# Patient Record
Sex: Female | Born: 1970 | Race: Black or African American | Hispanic: No | Marital: Single | Smoking: Former smoker
Health system: Southern US, Community
[De-identification: ages and names within clinical notes are randomized; demographics above are authoritative.]

## PROBLEM LIST (undated history)

## (undated) DIAGNOSIS — D219 Benign neoplasm of connective and other soft tissue, unspecified: Secondary | ICD-10-CM

## (undated) DIAGNOSIS — B009 Herpesviral infection, unspecified: Secondary | ICD-10-CM

## (undated) DIAGNOSIS — H9202 Otalgia, left ear: Secondary | ICD-10-CM

## (undated) DIAGNOSIS — H00016 Hordeolum externum left eye, unspecified eyelid: Secondary | ICD-10-CM

## (undated) DIAGNOSIS — Z8679 Personal history of other diseases of the circulatory system: Secondary | ICD-10-CM

## (undated) DIAGNOSIS — E66813 Obesity, class 3: Secondary | ICD-10-CM

## (undated) DIAGNOSIS — J40 Bronchitis, not specified as acute or chronic: Secondary | ICD-10-CM

## (undated) DIAGNOSIS — T7840XA Allergy, unspecified, initial encounter: Secondary | ICD-10-CM

## (undated) DIAGNOSIS — E559 Vitamin D deficiency, unspecified: Secondary | ICD-10-CM

## (undated) DIAGNOSIS — I509 Heart failure, unspecified: Secondary | ICD-10-CM

## (undated) DIAGNOSIS — I429 Cardiomyopathy, unspecified: Secondary | ICD-10-CM

## (undated) DIAGNOSIS — I10 Essential (primary) hypertension: Secondary | ICD-10-CM

## (undated) DIAGNOSIS — J302 Other seasonal allergic rhinitis: Secondary | ICD-10-CM

## (undated) DIAGNOSIS — N809 Endometriosis, unspecified: Secondary | ICD-10-CM

## (undated) HISTORY — DX: Herpesviral infection, unspecified: B00.9

## (undated) HISTORY — DX: Endometriosis, unspecified: N80.9

## (undated) HISTORY — PX: BREAST LUMPECTOMY: SHX2

## (undated) HISTORY — DX: Vitamin D deficiency, unspecified: E55.9

## (undated) HISTORY — DX: Morbid (severe) obesity due to excess calories: E66.01

## (undated) HISTORY — DX: Benign neoplasm of connective and other soft tissue, unspecified: D21.9

## (undated) HISTORY — DX: Allergy, unspecified, initial encounter: T78.40XA

## (undated) HISTORY — DX: Hordeolum externum left eye, unspecified eyelid: H00.016

## (undated) HISTORY — DX: Otalgia, left ear: H92.02

## (undated) HISTORY — DX: Essential (primary) hypertension: I10

## (undated) HISTORY — DX: Other seasonal allergic rhinitis: J30.2

## (undated) HISTORY — DX: Personal history of other diseases of the circulatory system: Z86.79

## (undated) HISTORY — DX: Obesity, class 3: E66.813

---

## 1992-10-14 HISTORY — PX: BREAST BIOPSY: SHX20

## 2001-05-15 ENCOUNTER — Other Ambulatory Visit: Admission: RE | Admit: 2001-05-15 | Discharge: 2001-05-15 | Payer: Self-pay | Admitting: Obstetrics and Gynecology

## 2008-10-26 ENCOUNTER — Ambulatory Visit: Payer: Self-pay | Admitting: Obstetrics & Gynecology

## 2008-10-27 ENCOUNTER — Inpatient Hospital Stay: Payer: Self-pay | Admitting: Obstetrics & Gynecology

## 2008-11-02 ENCOUNTER — Inpatient Hospital Stay: Payer: Self-pay | Admitting: Internal Medicine

## 2008-12-12 HISTORY — PX: COLPOSCOPY: SHX161

## 2008-12-12 HISTORY — PX: COLONOSCOPY: SHX174

## 2009-01-10 DIAGNOSIS — I10 Essential (primary) hypertension: Secondary | ICD-10-CM | POA: Insufficient documentation

## 2013-12-12 HISTORY — PX: INTRAUTERINE DEVICE INSERTION: SHX323

## 2014-07-19 ENCOUNTER — Ambulatory Visit: Payer: Self-pay | Admitting: Family Medicine

## 2015-05-07 ENCOUNTER — Other Ambulatory Visit: Payer: Self-pay | Admitting: Family Medicine

## 2015-06-13 ENCOUNTER — Encounter: Payer: Self-pay | Admitting: Family Medicine

## 2015-06-13 ENCOUNTER — Ambulatory Visit (INDEPENDENT_AMBULATORY_CARE_PROVIDER_SITE_OTHER): Payer: BC Managed Care – PPO | Admitting: Family Medicine

## 2015-06-13 VITALS — BP 118/76 | HR 87 | Temp 98.0°F | Resp 16 | Wt 260.2 lb

## 2015-06-13 DIAGNOSIS — I10 Essential (primary) hypertension: Secondary | ICD-10-CM

## 2015-06-13 DIAGNOSIS — H00039 Abscess of eyelid unspecified eye, unspecified eyelid: Secondary | ICD-10-CM | POA: Insufficient documentation

## 2015-06-13 DIAGNOSIS — Z23 Encounter for immunization: Secondary | ICD-10-CM | POA: Diagnosis not present

## 2015-06-13 DIAGNOSIS — I5020 Unspecified systolic (congestive) heart failure: Secondary | ICD-10-CM

## 2015-06-13 DIAGNOSIS — Z13220 Encounter for screening for lipoid disorders: Secondary | ICD-10-CM | POA: Insufficient documentation

## 2015-06-13 DIAGNOSIS — Z1239 Encounter for other screening for malignant neoplasm of breast: Secondary | ICD-10-CM | POA: Insufficient documentation

## 2015-06-13 DIAGNOSIS — O903 Peripartum cardiomyopathy: Secondary | ICD-10-CM

## 2015-06-13 DIAGNOSIS — O2690 Pregnancy related conditions, unspecified, unspecified trimester: Secondary | ICD-10-CM | POA: Insufficient documentation

## 2015-06-13 DIAGNOSIS — R87610 Atypical squamous cells of undetermined significance on cytologic smear of cervix (ASC-US): Secondary | ICD-10-CM | POA: Insufficient documentation

## 2015-06-13 DIAGNOSIS — Z713 Dietary counseling and surveillance: Secondary | ICD-10-CM | POA: Insufficient documentation

## 2015-06-13 DIAGNOSIS — Z131 Encounter for screening for diabetes mellitus: Secondary | ICD-10-CM | POA: Insufficient documentation

## 2015-06-13 DIAGNOSIS — J302 Other seasonal allergic rhinitis: Secondary | ICD-10-CM | POA: Insufficient documentation

## 2015-06-13 DIAGNOSIS — H9209 Otalgia, unspecified ear: Secondary | ICD-10-CM | POA: Insufficient documentation

## 2015-06-13 DIAGNOSIS — J309 Allergic rhinitis, unspecified: Secondary | ICD-10-CM | POA: Insufficient documentation

## 2015-06-13 DIAGNOSIS — R0602 Shortness of breath: Secondary | ICD-10-CM | POA: Insufficient documentation

## 2015-06-13 DIAGNOSIS — Z87898 Personal history of other specified conditions: Secondary | ICD-10-CM | POA: Insufficient documentation

## 2015-06-13 DIAGNOSIS — I509 Heart failure, unspecified: Secondary | ICD-10-CM | POA: Insufficient documentation

## 2015-06-13 DIAGNOSIS — E668 Other obesity: Secondary | ICD-10-CM | POA: Insufficient documentation

## 2015-06-13 HISTORY — DX: Peripartum cardiomyopathy: O90.3

## 2015-06-13 HISTORY — DX: Personal history of other specified conditions: Z87.898

## 2015-06-13 HISTORY — DX: Dietary counseling and surveillance: Z71.3

## 2015-06-13 MED ORDER — LORCASERIN HCL 10 MG PO TABS: 1.0000 | ORAL_TABLET | Freq: Two times a day (BID) | ORAL | Status: DC

## 2015-06-13 NOTE — Progress Notes (Signed)
Name: Heather Case   MRN: [id number]    DOB: 10-29-1970   Date:06/13/2015       Progress Note  Subjective  Chief Complaint  Chief Complaint  Patient presents with  . Hypertension    patient is here for her 59-month follow-up  . Obesity    patient needs a refill of her Belviq    HPI  Patient is here for routine follow up of Hypertension. First diagnosed with hypertension several years ago. Current anti-hypertension medication regimen includes dietary modification, weight management and Lisinopril 40 mg daily, Carvedilol 6.25 mg twice a day.  She was first diagnosed with CHF after her pregnancy some years ago. Since then followed by Dr. Clayborn Bigness, cardiology, yearly with no exacerbations or acute failures. Patient is following physician recommended management. Checking blood pressure outside of physician office. Results average systolic [id number] and average diastolic 21-30. Associated symptoms do not include headache, dizziness, nausea, lower extremity swelling, shortness of breath, chest pain, numbness.  Patient is here for routine follow up of Obesity. The patient is appropriately concerned about their weight. Onset of weight issues started several years ago. Highest recorded weight 260 lbs. Associated symptoms or diseases include pain in weight bearing joints, HTN, CHF. Current efforts to reduce weight include low fat, low carbohydrate, low sugar diet along with daily exercise.  Medications used to aid in weight loss efforts include Belviq 10 mg twice a day.  Describe current exercise regimen: infrequently.  Describe current diet regimen: poor diet habits   Active Ambulatory Problems    Diagnosis Date Noted  . Atypical squamous cell changes of cervix undetermined significance favor benign 06/13/2015  . Allergic rhinitis, seasonal 06/13/2015  . History of prolonged Q-T interval on ECG 06/13/2015  . Hypertension goal BP (blood pressure) < 140/90 01/10/2009  . Dietary counseling and  surveillance 06/13/2015  . CHF NYHA class I (no symptoms from ordinary activities) 06/13/2015  . Obesity, Class II, BMI 35-39.9, with comorbidity 06/13/2015  . 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 Need for immunization against influenza 06/13/2015   Resolved Ambulatory Problems    Diagnosis Date Noted  . Encounter for screening for diabetes mellitus 06/13/2015  . Encounter for immunization 06/13/2015  . Boil of eyelid 06/13/2015  . Unilateral earache 06/13/2015  . Extreme obesity 06/13/2015  . Parity 2 06/13/2015  . Pregnancy, complications of 86/57/8469  . Encounter for screening for lipoid disorders 06/13/2015  . Screening breast examination 06/13/2015  . Term birth of infant 06/13/2015  . Breath shortness 06/13/2015   Past Medical History  Diagnosis Date  . Obesity, Class III, BMI 40-49.9 (morbid obesity)   . HTN, goal below 140/90   . History of CHF (congestive heart failure)   . Hordeolum externum of left eye   . Left ear pain     Past Surgical History  Procedure Laterality Date  . Colonoscopy  12/2008    Dr. Kenton Kingfisher, follow-up ASCUS  . Cesarean section      x's 2  . Breast lumpectomy      benign (at age 43yr)    Family History  Problem Relation Age of Onset  . Cancer Mother     breast  . Hypertension Father   . Diabetes Father   . Cancer Maternal Grandmother     breast    Social History   Social History  . Marital Status: Unknown    Spouse Name: N/A  . Number of Children: N/A  . Years of Education: N/A  Occupational History  . Not on file.   Social History Main Topics  . Smoking status: Former Research scientist (life sciences)  . Smokeless tobacco: Not on file     Comment: quit in 2010. Used to smoke 1/2 PPD.  Marland Kitchen Alcohol Use: 0.0 oz/week    0 Standard drinks or equivalent per week     Comment: occasional  . Drug Use: No  . Sexual Activity:    Partners: Male    Birth Control/ Protection: Condom   Other Topics Concern  . Not on file   Social History  Narrative     Current outpatient prescriptions:  .  aspirin EC 81 MG tablet, Take by mouth., Disp: , Rfl:  .  carvedilol (COREG) 6.25 MG tablet, TAKE 1 TABLET BY MOUTH TWICE A DAY, Disp: 60 tablet, Rfl: 1 .  fluticasone (FLONASE) 50 MCG/ACT nasal spray, Place into the nose., Disp: , Rfl:  .  lisinopril (PRINIVIL,ZESTRIL) 40 MG tablet, Take by mouth., Disp: , Rfl:  .  loratadine (CLARITIN) 10 MG tablet, Take by mouth., Disp: , Rfl:  .  Lorcaserin HCl (BELVIQ) 10 MG TABS, Take 1 tablet by mouth 2 (two) times daily., Disp: 60 tablet, Rfl: 2  Allergies  Allergen Reactions  . Penicillin V Rash     ROS  CONSTITUTIONAL: No significant weight changes, fever, chills, weakness or fatigue.  HEENT:  - Eyes: No visual changes.  - Ears: No auditory changes. No pain.  - Nose: No sneezing, congestion, runny nose. - Throat: No sore throat. No changes in swallowing. SKIN: No rash or itching.  CARDIOVASCULAR: No chest pain, chest pressure or chest discomfort. No palpitations or edema.  RESPIRATORY: No shortness of breath, cough or sputum.  GASTROINTESTINAL: No anorexia, nausea, vomiting. No changes in bowel habits. No abdominal pain or blood.  GENITOURINARY: No dysuria. No frequency. No discharge.  NEUROLOGICAL: No headache, dizziness, syncope, paralysis, ataxia, numbness or tingling in the extremities. No memory changes. No change in bowel or bladder control.  MUSCULOSKELETAL: No joint pain. No muscle pain. HEMATOLOGIC: No anemia, bleeding or bruising.  LYMPHATICS: No enlarged lymph nodes.  PSYCHIATRIC: No change in mood. No change in sleep pattern.  ENDOCRINOLOGIC: No reports of sweating, cold or heat intolerance. No polyuria or polydipsia.   Objective  Filed Vitals:   06/13/15 0814  BP: 118/76  Pulse: 87  Temp: 98 F (36.7 C)  TempSrc: Oral  Resp: 16  Weight: 260 lb 3.2 oz (118.026 kg)  SpO2: 97%   Body mass index is 40.74 kg/(m^2).  Physical Exam  Constitutional: Patient is  obese and well-nourished. In no distress.  HEENT:  - Head: Normocephalic and atraumatic.  - Ears: Bilateral TMs gray, no erythema or effusion - Nose: Nasal mucosa moist - Mouth/Throat: Oropharynx is clear and moist. No tonsillar hypertrophy or erythema. No post nasal drainage.  - Eyes: Conjunctivae clear, EOM movements normal. PERRLA. No scleral icterus.  Neck: Normal range of motion. Neck supple. No JVD present. No thyromegaly present.  Cardiovascular: Normal rate, regular rhythm and normal heart sounds.  No murmur heard.  Pulmonary/Chest: Effort normal and breath sounds normal. No respiratory distress. Musculoskeletal: Normal range of motion bilateral UE and LE, no joint effusions. Peripheral vascular: Bilateral LE no edema. Neurological: CN II-XII grossly intact with no focal deficits. Alert and oriented to person, place, and time. Coordination, balance, strength, speech and gait are normal.  Skin: Skin is warm and dry. No rash noted. No erythema.  Psychiatric: Patient has a normal mood and  affect. Behavior is normal in office today. Judgment and thought content normal in office today.   Assessment & Plan  1. Hypertension goal BP (blood pressure) < 140/90 Clinically stable findings based on clinical exam and on review of any pertinent results. Recommended to patient that they continue their current regimen with regular follow ups.   2. CHF NYHA class I (no symptoms from ordinary activities), unspecified failure chronicity, systolic Clinically stable findings based on clinical exam and on review of any pertinent results. Recommended to patient that they continue their current regimen with regular follow ups.   3. Obesity, Class III, BMI 40-49.9 (morbid obesity) Has not lost much weight since starting Belviq earlier this year.  If not lost 5% of current weight in 3 months will discontinue Belviq.  The patient has been counseled on their higher than normal BMI.  They have verbally  expressed understanding their increased risk for other diseases.  In efforts to meet a better target BMI goal the patient has been counseled on lifestyle, diet and exercise modification tactics. Start with moderate intensity aerobic exercise (walking, jogging, elliptical, swimming, group or individual sports, hiking) at least 51mins a day at least 4 days a week and increase intensity, duration, frequency as tolerated. Diet should include well balance fresh fruits and vegetables avoiding processed foods, carbohydrates and sugars. Drink at least 8oz 10 glasses a day avoiding sodas, sugary fruit drinks, sweetened tea. Check weight on a reliable scale daily and monitor weight loss progress daily. Consider investing in mobile phone apps that will help keep track of weight loss goals.  - Lorcaserin HCl (BELVIQ) 10 MG TABS; Take 1 tablet by mouth 2 (two) times daily.  Dispense: 60 tablet; Refill: 2  4. Need for immunization against influenza  - Flu Vaccine QUAD 36+ mos PF IM (Fluarix & Fluzone Quad PF)

## 2015-07-11 ENCOUNTER — Other Ambulatory Visit: Payer: Self-pay | Admitting: Family Medicine

## 2015-07-11 NOTE — Telephone Encounter (Signed)
Patient requesting refill. 

## 2015-10-26 ENCOUNTER — Other Ambulatory Visit: Payer: Self-pay | Admitting: Family Medicine

## 2015-12-12 ENCOUNTER — Encounter: Payer: Self-pay | Admitting: Family Medicine

## 2015-12-12 ENCOUNTER — Ambulatory Visit (INDEPENDENT_AMBULATORY_CARE_PROVIDER_SITE_OTHER): Payer: BC Managed Care – PPO | Admitting: Family Medicine

## 2015-12-12 VITALS — BP 130/70 | HR 92 | Temp 98.3°F | Resp 16 | Ht 67.0 in | Wt 254.0 lb

## 2015-12-12 DIAGNOSIS — I10 Essential (primary) hypertension: Secondary | ICD-10-CM | POA: Diagnosis not present

## 2015-12-12 DIAGNOSIS — M25562 Pain in left knee: Secondary | ICD-10-CM | POA: Diagnosis not present

## 2015-12-12 DIAGNOSIS — IMO0001 Reserved for inherently not codable concepts without codable children: Secondary | ICD-10-CM

## 2015-12-12 MED ORDER — CARVEDILOL 6.25 MG PO TABS: 6.2500 mg | ORAL_TABLET | Freq: Two times a day (BID) | ORAL | Status: DC

## 2015-12-12 MED ORDER — LISINOPRIL 40 MG PO TABS: 40.0000 mg | ORAL_TABLET | Freq: Every day | ORAL | Status: DC

## 2015-12-12 NOTE — Patient Instructions (Signed)

## 2015-12-12 NOTE — Addendum Note (Signed)
Addended by: Bobetta Lime on: 12/12/2015 08:54 AM   Modules accepted: Orders, Medications

## 2015-12-12 NOTE — Progress Notes (Signed)
Name: Heather Case   MRN: [id number]    DOB: 10-27-1970   Date:12/12/2015       Progress Note  Subjective  Chief Complaint  Chief Complaint  Patient presents with  . Medication Refill  . Hypertension  . Obesity    weight loss medication  . Knee Pain    left onset 2 days worsening.  Has been running in a (exercise bootcamp) patient states felt a pop.  Syptoms include swelling and pain    HPI  Heather Case is a 45 year old female with acute concerns of left knee pain. Onset 2 days ago while running in an exercise boot camp.   Patient is here for routine follow up of Hypertension. First diagnosed with hypertension several years ago. Current anti-hypertension medication regimen includes dietary modification, weight management and Lisinopril 40 mg daily, Carvedilol 6.25 mg twice a day. She was first diagnosed with CHF after her pregnancy some years ago. Since then followed by Dr. Clayborn Bigness, cardiology, yearly with no exacerbations or acute failures. Patient is following physician recommended management. Checking blood pressure outside of physician office. Results average systolic [id number] and average diastolic [id number]. Associated symptoms do not include headache, dizziness, nausea, lower extremity swelling, shortness of breath, chest pain, numbness.  Patient is here for routine follow up of Obesity. The patient is appropriately concerned about their weight. Onset of weight issues started several years ago. Highest recorded weight 260 lbs. Today she is 256 lbs. Associated symptoms or diseases include pain in weight bearing joints, HTN, CHF. Current efforts to reduce weight include low fat, low carbohydrate, low sugar diet along with daily exercise. Medications used to aid in weight loss efforts include Belviq 10 mg twice a day in 2016 for 3 months duration.  The medication did not suppress appetite or help with weight loss per patient so she did not refill further. Describe current exercise  regimen: boot camp circuit training group fitness. Describe current diet regimen: working on portion and content control.   Past Medical History  Diagnosis Date  . Obesity, Class III, BMI 40-49.9 (morbid obesity) ([place])   . HTN, goal below 140/90   . Allergic rhinitis, seasonal   . History of CHF (congestive heart failure)   . Hordeolum externum of left eye   . Left ear pain     Patient Active Problem List   Diagnosis Date Noted  . Atypical squamous cell changes of cervix undetermined significance favor benign 06/13/2015  . Allergic rhinitis, seasonal 06/13/2015  . History of prolonged Q-T interval on ECG 06/13/2015  . Dietary counseling and surveillance 06/13/2015  . CHF NYHA class I (no symptoms from ordinary activities) ([place]) 06/13/2015  . Obesity, Class II, BMI 35-39.9, with comorbidity ([place]) 06/13/2015  . 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 Need for immunization against influenza 06/13/2015  . Hypertension goal BP (blood pressure) < 140/90 01/10/2009    Social History  Substance Use Topics  . Smoking status: Former Research scientist (life sciences)  . Smokeless tobacco: Not on file     Comment: quit in 2010. Used to smoke 1/2 PPD.  Marland Kitchen Alcohol Use: 0.0 oz/week    0 Standard drinks or equivalent per week     Comment: occasional     Current outpatient prescriptions:  .  aspirin EC 81 MG tablet, Take by mouth., Disp: , Rfl:  .  carvedilol (COREG) 6.25 MG tablet, TAKE 1 TABLET BY MOUTH TWICE A DAY, Disp: 60 tablet, Rfl: 5 .  fluticasone (FLONASE) 50 MCG/ACT  nasal spray, Place into the nose., Disp: , Rfl:  .  lisinopril (PRINIVIL,ZESTRIL) 40 MG tablet, Take by mouth., Disp: , Rfl:  .  loratadine (CLARITIN) 10 MG tablet, TAKE 1 TABLET DAILY, Disp: 30 tablet, Rfl: 5 .  Lorcaserin HCl (BELVIQ) 10 MG TABS, Take 1 tablet by mouth 2 (two) times daily., Disp: 60 tablet, Rfl: 2  Past Surgical History  Procedure Laterality Date  . Colonoscopy  12/2008    Dr. Kenton Kingfisher, follow-up ASCUS  . Cesarean  section      x's 2  . Breast lumpectomy      benign (at age 55yr)    Family History  Problem Relation Age of Onset  . Cancer Mother     breast  . Hypertension Father   . Diabetes Father   . Cancer Maternal Grandmother     breast    Allergies  Allergen Reactions  . Penicillin V Rash     Review of Systems  CONSTITUTIONAL: No significant weight changes, fever, chills, weakness or fatigue.  HEENT:  - Eyes: No visual changes.  - Ears: No auditory changes. No pain.  - Nose: No sneezing, congestion, runny nose. - Throat: No sore throat. No changes in swallowing. SKIN: No rash or itching.  CARDIOVASCULAR: No chest pain, chest pressure or chest discomfort. No palpitations or edema.  RESPIRATORY: No shortness of breath, cough or sputum.  GASTROINTESTINAL: No anorexia, nausea, vomiting. No changes in bowel habits. No abdominal pain or blood.  GENITOURINARY: No dysuria. No frequency. No discharge. NEUROLOGICAL: No headache, dizziness, syncope, paralysis, ataxia, numbness or tingling in the extremities. No memory changes. No change in bowel or bladder control.  MUSCULOSKELETAL: Yes joint pain. No muscle pain. HEMATOLOGIC: No anemia, bleeding or bruising.  LYMPHATICS: No enlarged lymph nodes.  PSYCHIATRIC: No change in mood. No change in sleep pattern.  ENDOCRINOLOGIC: No reports of sweating, cold or heat intolerance. No polyuria or polydipsia.     Objective  BP 130/70 mmHg  Pulse 92  Temp(Src) 98.3 F (36.8 C) (Oral)  Resp 16  Ht 5\' 7"  (1.702 m)  Wt 254 lb (115.214 kg)  BMI 39.77 kg/m2  SpO2 95%  LMP 12/07/2015 (Approximate) Body mass index is 39.77 kg/(m^2).  Physical Exam  Constitutional: Patient is obese and well-nourished. In no distress.  Cardiovascular: Normal rate, regular rhythm and normal heart sounds.  No murmur heard.  Pulmonary/Chest: Effort normal and breath sounds normal. No respiratory distress. Musculoskeletal: Normal range of motion bilateral UE and  LE, no joint effusions.  Left knee full ROM, no effusion, no laxity, no crepitus. No pain reproduced by valgus or varus stress testing.  Peripheral vascular: Bilateral LE no edema. Neurological: CN II-XII grossly intact with no focal deficits. Alert and oriented to person, place, and time. Coordination, balance, strength, speech and gait are normal.  Skin: Skin is warm and dry. No rash noted. No erythema.  Psychiatric: Patient has a normal mood and affect. Behavior is normal in office today. Judgment and thought content normal in office today.  Assessment & Plan   1. Hypertension goal BP (blood pressure) < 140/90 Well controled.  - lisinopril (PRINIVIL,ZESTRIL) 40 MG tablet; Take 1 tablet (40 mg total) by mouth daily.  Dispense: 90 tablet; Refill: 2 - carvedilol (COREG) 6.25 MG tablet; Take 1 tablet (6.25 mg total) by mouth 2 (two) times daily.  Dispense: 180 tablet; Refill: 2  2. Obesity, Class II, BMI 35-39.9, with comorbidity ([place]) Doing well with weight loss efforts.   3.  Left knee pain Sprain, home management discussed.

## 2015-12-27 ENCOUNTER — Other Ambulatory Visit: Payer: Self-pay

## 2015-12-27 DIAGNOSIS — I10 Essential (primary) hypertension: Secondary | ICD-10-CM

## 2015-12-27 MED ORDER — LORATADINE 10 MG PO TABS: 10.0000 mg | ORAL_TABLET | Freq: Every day | ORAL | Status: DC

## 2015-12-27 MED ORDER — CARVEDILOL 6.25 MG PO TABS: 6.2500 mg | ORAL_TABLET | Freq: Two times a day (BID) | ORAL | Status: DC

## 2016-07-29 ENCOUNTER — Other Ambulatory Visit: Payer: Self-pay

## 2016-07-29 MED ORDER — FLUTICASONE PROPIONATE 50 MCG/ACT NA SUSP: 2.0000 | Freq: Every day | NASAL | 4 refills | Status: DC

## 2016-08-27 ENCOUNTER — Ambulatory Visit
Admission: RE | Admit: 2016-08-27 | Discharge: 2016-08-27 | Disposition: A | Discharge status: Home / Self Care | Payer: BC Managed Care – PPO | Source: Ambulatory Visit | Attending: Obstetrics & Gynecology | Admitting: Obstetrics & Gynecology

## 2016-08-27 ENCOUNTER — Other Ambulatory Visit: Payer: Self-pay | Admitting: Obstetrics & Gynecology

## 2016-08-27 DIAGNOSIS — Z1231 Encounter for screening mammogram for malignant neoplasm of breast: Secondary | ICD-10-CM | POA: Insufficient documentation

## 2016-08-27 LAB — HM PAP SMEAR: HM PAP: NORMAL

## 2016-09-30 ENCOUNTER — Other Ambulatory Visit: Payer: Self-pay

## 2016-09-30 MED ORDER — LORATADINE 10 MG PO TABS: 10.0000 mg | ORAL_TABLET | Freq: Every day | ORAL | 1 refills | Status: DC

## 2016-12-24 ENCOUNTER — Telehealth: Payer: Self-pay | Admitting: Family Medicine

## 2016-12-24 NOTE — Telephone Encounter (Signed)
appt made for 01-13-17 and informed that prescription has been sent to pharmacy

## 2016-12-24 NOTE — Telephone Encounter (Signed)
Patient's last visit with Dr. Nadine Counts was Feb 2017; please invite her to schedule a visit since it's been more than a year here Thank you I'll refill nasal spray and look forward to meeting her

## 2017-01-13 ENCOUNTER — Ambulatory Visit (INDEPENDENT_AMBULATORY_CARE_PROVIDER_SITE_OTHER): Payer: BC Managed Care – PPO | Admitting: Family Medicine

## 2017-01-13 ENCOUNTER — Encounter: Payer: Self-pay | Admitting: Family Medicine

## 2017-01-13 DIAGNOSIS — O903 Peripartum cardiomyopathy: Secondary | ICD-10-CM

## 2017-01-13 DIAGNOSIS — J301 Allergic rhinitis due to pollen: Secondary | ICD-10-CM | POA: Diagnosis not present

## 2017-01-13 DIAGNOSIS — Z5181 Encounter for therapeutic drug level monitoring: Secondary | ICD-10-CM | POA: Diagnosis not present

## 2017-01-13 DIAGNOSIS — Z6841 Body Mass Index (BMI) 40.0 and over, adult: Secondary | ICD-10-CM | POA: Diagnosis not present

## 2017-01-13 DIAGNOSIS — Z1211 Encounter for screening for malignant neoplasm of colon: Secondary | ICD-10-CM

## 2017-01-13 DIAGNOSIS — I10 Essential (primary) hypertension: Secondary | ICD-10-CM

## 2017-01-13 DIAGNOSIS — Z0000 Encounter for general adult medical examination without abnormal findings: Secondary | ICD-10-CM

## 2017-01-13 DIAGNOSIS — Z862 Personal history of diseases of the blood and blood-forming organs and certain disorders involving the immune mechanism: Secondary | ICD-10-CM

## 2017-01-13 HISTORY — DX: Encounter for screening for malignant neoplasm of colon: Z12.11

## 2017-01-13 LAB — CBC WITH DIFFERENTIAL/PLATELET
BASOS PCT: 1 %
Basophils Absolute: 55 cells/uL (ref 0–200)
EOS PCT: 3 %
Eosinophils Absolute: 165 cells/uL (ref 15–500)
HCT: 37.5 % (ref 35.0–45.0)
HEMOGLOBIN: 12.2 g/dL (ref 11.7–15.5)
LYMPHS ABS: 1980 {cells}/uL (ref 850–3900)
Lymphocytes Relative: 36 %
MCH: 31.6 pg (ref 27.0–33.0)
MCHC: 32.5 g/dL (ref 32.0–36.0)
MCV: 97.2 fL (ref 80.0–100.0)
MPV: 9.9 fL (ref 7.5–12.5)
Monocytes Absolute: 440 cells/uL (ref 200–950)
Monocytes Relative: 8 %
NEUTROS ABS: 2860 {cells}/uL (ref 1500–7800)
Neutrophils Relative %: 52 %
Platelets: 222 10*3/uL (ref 140–400)
RBC: 3.86 MIL/uL (ref 3.80–5.10)
RDW: 13.6 % (ref 11.0–15.0)
WBC: 5.5 10*3/uL (ref 3.8–10.8)

## 2017-01-13 LAB — COMPLETE METABOLIC PANEL WITH GFR
ALT: 20 U/L (ref 6–29)
AST: 22 U/L (ref 10–30)
Albumin: 4.1 g/dL (ref 3.6–5.1)
Alkaline Phosphatase: 58 U/L (ref 33–115)
BUN: 16 mg/dL (ref 7–25)
CALCIUM: 9.3 mg/dL (ref 8.6–10.2)
CHLORIDE: 106 mmol/L (ref 98–110)
CO2: 26 mmol/L (ref 20–31)
Creat: 0.97 mg/dL (ref 0.50–1.10)
GFR, EST NON AFRICAN AMERICAN: 72 mL/min (ref >=60)
GFR, Est African American: 83 mL/min (ref >=60)
Glucose, Bld: 89 mg/dL (ref 65–99)
POTASSIUM: 4 mmol/L (ref 3.5–5.3)
SODIUM: 138 mmol/L (ref 135–146)
Total Bilirubin: 0.7 mg/dL (ref 0.2–1.2)
Total Protein: 6.4 g/dL (ref 6.1–8.1)

## 2017-01-13 LAB — LIPID PANEL
CHOL/HDL RATIO: 2.4 ratio (ref <5.0)
CHOLESTEROL: 152 mg/dL (ref <200)
HDL: 64 mg/dL (ref >50)
LDL Cholesterol: 77 mg/dL (ref <100)
Triglycerides: 57 mg/dL (ref <150)
VLDL: 11 mg/dL (ref <30)

## 2017-01-13 MED ORDER — FLUTICASONE PROPIONATE 50 MCG/ACT NA SUSP: 2.0000 | Freq: Every day | NASAL | 3 refills | Status: DC

## 2017-01-13 NOTE — Assessment & Plan Note (Signed)
Managed by Dr. Callwood 

## 2017-01-13 NOTE — Assessment & Plan Note (Signed)
Continue the nasal corticosteroid and antihistamine

## 2017-01-13 NOTE — Assessment & Plan Note (Signed)
Check Cr and K+ 

## 2017-01-13 NOTE — Progress Notes (Signed)
BP 132/84   Pulse 80   Temp 97.9 F (36.6 C) (Oral)   Resp 14   Wt 270 lb 1.6 oz (122.5 kg)   LMP 01/02/2017   SpO2 96%   BMI 42.30 kg/m    Subjective:    Patient ID: Heather Case, female    DOB: 1971/06/17, 46 y.o.   MRN: [id number]  HPI: Heather Case is a 46 y.o. female  Chief Complaint  Patient presents with  . Medication Refill   Patient is here as a new patient to me; sees Dr. Clayborn Bigness, cardiologist, for postpartum cardiomyopathy and congestive heart failure Seeing her once a year He prescribes all the heart failure medicine; no dry cough  Allergic rhinitis; allergic to penicillin; allergic to cats; pollen season as well; no major changes at home; no pets at home; eye drops from eye doctor  GYN does well women exams, October, [place]  Hx of iron def anemia; energy is fair, she is a busy mother  Obesity; I offered any help, she says she is working on it  Depression screen [facility] 2/9 01/13/2017 12/12/2015 06/13/2015  Decreased Interest 0 0 0  Down, Depressed, Hopeless 0 0 0  PHQ - 2 Score 0 0 0    Relevant past medical, surgical, family and social history reviewed Past Medical History:  Diagnosis Date  . Allergic rhinitis, seasonal   . History of CHF (congestive heart failure)   . Hordeolum externum of left eye   . HTN, goal below 140/90   . Left ear pain   . Obesity, Class III, BMI 40-49.9 (morbid obesity) ([place])    Past Surgical History:  Procedure Laterality Date  . BREAST BIOPSY Right 1994   Negative  . BREAST LUMPECTOMY     benign (at age 54yr)  . CESAREAN SECTION     x's 2  . COLONOSCOPY  12/2008   Dr. Kenton Kingfisher, follow-up ASCUS   Family History  Problem Relation Age of Onset  . Cancer Mother     breast  . Breast cancer Mother   . Hypertension Father   . Diabetes Father   . Cancer Maternal Grandmother     breast  . Breast cancer Maternal Grandmother   . Breast cancer Paternal Grandmother    Social History  Substance Use Topics  .  Smoking status: Former Research scientist (life sciences)  . Smokeless tobacco: Never Used     Comment: quit in 2010. Used to smoke 1/2 PPD.  Marland Kitchen Alcohol use 0.0 oz/week     Comment: occasional    Interim medical history since last visit reviewed. Allergies and medications reviewed  Review of Systems Per HPI unless specifically indicated above     Objective:    BP 132/84   Pulse 80   Temp 97.9 F (36.6 C) (Oral)   Resp 14   Wt 270 lb 1.6 oz (122.5 kg)   LMP 01/02/2017   SpO2 96%   BMI 42.30 kg/m   Wt Readings from Last 3 Encounters:  01/13/17 270 lb 1.6 oz (122.5 kg)  12/12/15 254 lb (115.2 kg)  06/13/15 260 lb 3.2 oz (118 kg)    Physical Exam  Constitutional: She appears well-developed and well-nourished.  Morbidly obese, no distress  HENT:  Mouth/Throat: Mucous membranes are normal.  Eyes: EOM are normal. No scleral icterus.  Neck: No JVD present.  Cardiovascular: Normal rate and regular rhythm.   Pulmonary/Chest: Effort normal and breath sounds normal.  No basilar crackles  Musculoskeletal: She exhibits  no edema.  Neurological: She is alert.  Psychiatric: She has a normal mood and affect. Her behavior is normal.    No results found for this or any previous visit.    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 Clayborn Bigness      Relevant Medications   carvedilol (COREG) 12.5 MG tablet   furosemide (LASIX) 20 MG tablet     Respiratory   Allergic rhinitis, seasonal    Continue the nasal corticosteroid and antihistamine        Other   Morbid obesity ([place])    I asked patient if she wanted any help with this; I am here as a resource; she said she is working on this on her own      Medication  monitoring encounter    Check Cr and K+      Relevant Orders   Lipid panel   COMPLETE METABOLIC PANEL WITH GFR   Hx of iron deficiency anemia    Check CBC      Relevant Orders   CBC with Differential/Platelet       Follow up plan: Return in about 1 year (around 01/13/2018) for check up.  An after-visit summary was printed and given to the patient at [facility].  Please see the patient instructions which may contain other information and recommendations beyond what is mentioned above in the assessment and plan.  Meds ordered this encounter  Medications  . carvedilol (COREG) 12.5 MG tablet    Sig: Take 12.5 mg by mouth daily.    Refill:  11  . furosemide (LASIX) 20 MG tablet    Sig: Take 20 mg by mouth daily.    Refill:  11  . fluticasone (FLONASE) 50 MCG/ACT nasal spray    Sig: Place 2 sprays into both nostrils daily.    Dispense:  48 g    Refill:  3    Orders Placed This Encounter  Procedures  . Lipid panel  . COMPLETE METABOLIC PANEL WITH GFR  . CBC with Differential/Platelet

## 2017-01-13 NOTE — Assessment & Plan Note (Signed)
I asked patient if she wanted any help with this; I am here as a resource; she said she is working on this on her own

## 2017-01-13 NOTE — Assessment & Plan Note (Signed)
Check CBC 

## 2017-01-13 NOTE — Assessment & Plan Note (Signed)
Encouraged DASH guidelines 

## 2017-01-13 NOTE — Patient Instructions (Addendum)
Try to follow the DASH guidelines (DASH stands for Dietary Approaches to Stop Hypertension) Try to limit the sodium in your diet.  Ideally, consume less than 1.5 grams (less than 1,500mg ) per day. Do not add salt when cooking or at the table.  Check the sodium amount on labels when shopping, and choose items lower in sodium when given a choice. Avoid or limit foods that already contain a lot of sodium. Eat a diet rich in fruits and vegetables and whole grains.   DASH Eating Plan DASH stands for "Dietary Approaches to Stop Hypertension." The DASH eating plan is a healthy eating plan that has been shown to reduce high blood pressure (hypertension). It may also reduce your risk for type 2 diabetes, heart disease, and stroke. The DASH eating plan may also help with weight loss. What are tips for following this plan? General guidelines   Avoid eating more than 2,300 mg (milligrams) of salt (sodium) a day. If you have hypertension, you may need to reduce your sodium intake to 1,500 mg a day.  Limit alcohol intake to no more than 1 drink a day for nonpregnant women and 2 drinks a day for men. One drink equals 12 oz of beer, 5 oz of wine, or 1 oz of hard liquor.  Work with your health care provider to maintain a healthy body weight or to lose weight. Ask what an ideal weight is for you.  Get at least 30 minutes of exercise that causes your heart to beat faster (aerobic exercise) most days of the week. Activities may include walking, swimming, or biking.  Work with your health care provider or diet and nutrition specialist (dietitian) to adjust your eating plan to your individual calorie needs. Reading food labels   Check food labels for the amount of sodium per serving. Choose foods with less than 5 percent of the Daily Value of sodium. Generally, foods with less than 300 mg of sodium per serving fit into this eating plan.  To find whole grains, look for the word "whole" as the first word in the  ingredient list. Shopping   Buy products labeled as "low-sodium" or "no salt added."  Buy fresh foods. Avoid canned foods and premade or frozen meals. Cooking   Avoid adding salt when cooking. Use salt-free seasonings or herbs instead of table salt or sea salt. Check with your health care provider or pharmacist before using salt substitutes.  Do not fry foods. Cook foods using healthy methods such as baking, boiling, grilling, and broiling instead.  Cook with heart-healthy oils, such as olive, canola, soybean, or sunflower oil. Meal planning    Eat a balanced diet that includes:  5 or more servings of fruits and vegetables each day. At each meal, try to fill half of your plate with fruits and vegetables.  Up to 6-8 servings of whole grains each day.  Less than 6 oz of lean meat, poultry, or fish each day. A 3-oz serving of meat is about the same size as a deck of cards. One egg equals 1 oz.  2 servings of low-fat dairy each day.  A serving of nuts, seeds, or beans 5 times each week.  Heart-healthy fats. Healthy fats called Omega-3 fatty acids are found in foods such as flaxseeds and coldwater fish, like sardines, salmon, and mackerel.  Limit how much you eat of the following:  Canned or prepackaged foods.  Food that is high in trans fat, such as fried foods.  Food that is  high in saturated fat, such as fatty meat.  Sweets, desserts, sugary drinks, and other foods with added sugar.  Full-fat dairy products.  Do not salt foods before eating.  Try to eat at least 2 vegetarian meals each week.  Eat more home-cooked food and less restaurant, buffet, and fast food.  When eating at a restaurant, ask that your food be prepared with less salt or no salt, if possible. What foods are recommended? The items listed may not be a complete list. Talk with your dietitian about what dietary choices are best for you. Grains  Whole-grain or whole-wheat bread. Whole-grain or  whole-wheat pasta. Brown rice. [place]. Bulgur. Whole-grain and low-sodium cereals. Pita bread. Low-fat, low-sodium crackers. Whole-wheat flour tortillas. Vegetables  Fresh or frozen vegetables (raw, steamed, roasted, or grilled). Low-sodium or reduced-sodium tomato and vegetable juice. Low-sodium or reduced-sodium tomato sauce and tomato paste. Low-sodium or reduced-sodium canned vegetables. Fruits  All fresh, dried, or frozen fruit. Canned fruit in natural juice (without added sugar). Meat and other protein foods  Skinless chicken or [place]. Ground chicken or [place]. Pork with fat trimmed off. Fish and seafood. Egg whites. Dried beans, peas, or lentils. Unsalted nuts, nut butters, and seeds. Unsalted canned beans. Lean cuts of beef with fat trimmed off. Low-sodium, lean deli meat. Dairy  Low-fat (1%) or fat-free (skim) milk. Fat-free, low-fat, or reduced-fat cheeses. Nonfat, low-sodium ricotta or cottage cheese. Low-fat or nonfat yogurt. Low-fat, low-sodium cheese. Fats and oils  Soft margarine without trans fats. Vegetable oil. Low-fat, reduced-fat, or light mayonnaise and salad dressings (reduced-sodium). Canola, safflower, olive, soybean, and sunflower oils. Avocado. Seasoning and other foods  Herbs. Spices. Seasoning mixes without salt. Unsalted popcorn and pretzels. Fat-free sweets. What foods are not recommended? The items listed may not be a complete list. Talk with your dietitian about what dietary choices are best for you. Grains  Baked goods made with fat, such as croissants, muffins, or some breads. Dry pasta or rice meal packs. Vegetables  Creamed or fried vegetables. Vegetables in a cheese sauce. Regular canned vegetables (not low-sodium or reduced-sodium). Regular canned tomato sauce and paste (not low-sodium or reduced-sodium). Regular tomato and vegetable juice (not low-sodium or reduced-sodium). Angie Fava. Olives. Fruits  Canned fruit in a light or heavy syrup. Fried  fruit. Fruit in cream or butter sauce. Meat and other protein foods  Fatty cuts of meat. Ribs. Fried meat. Berniece Salines. Sausage. Bologna and other processed lunch meats. Salami. Fatback. Hotdogs. Bratwurst. Salted nuts and seeds. Canned beans with added salt. Canned or smoked fish. Whole eggs or egg yolks. Chicken or [place] with skin. Dairy  Whole or 2% milk, cream, and half-and-half. Whole or full-fat cream cheese. Whole-fat or sweetened yogurt. Full-fat cheese. Nondairy creamers. Whipped toppings. Processed cheese and cheese spreads. Fats and oils  Butter. Stick margarine. Lard. Shortening. Ghee. Bacon fat. Tropical oils, such as coconut, palm kernel, or palm oil. Seasoning and other foods  Salted popcorn and pretzels. Onion salt, garlic salt, seasoned salt, table salt, and sea salt. Worcestershire sauce. Tartar sauce. Barbecue sauce. Teriyaki sauce. Soy sauce, including reduced-sodium. Steak sauce. Canned and packaged gravies. Fish sauce. Oyster sauce. Cocktail sauce. Horseradish that you find on the shelf. Ketchup. Mustard. Meat flavorings and tenderizers. Bouillon cubes. Hot sauce and Tabasco sauce. Premade or packaged marinades. Premade or packaged taco seasonings. Relishes. Regular salad dressings. Where to find more information:  National Heart, Lung, and [place]: https://wilson-eaton.com/  American Heart Association: www.heart.org Summary  The DASH eating plan is a  healthy eating plan that has been shown to reduce high blood pressure (hypertension). It may also reduce your risk for type 2 diabetes, heart disease, and stroke.  With the DASH eating plan, you should limit salt (sodium) intake to 2,300 mg a day. If you have hypertension, you may need to reduce your sodium intake to 1,500 mg a day.  When on the DASH eating plan, aim to eat more fresh fruits and vegetables, whole grains, lean proteins, low-fat dairy, and heart-healthy fats.  Work with your health care provider or diet and  nutrition specialist (dietitian) to adjust your eating plan to your individual calorie needs. This information is not intended to replace advice given to you by your health care provider. Make sure you discuss any questions you have with your health care provider. Document Released: 09/19/2011 Document Revised: 09/23/2016 Document Reviewed: 09/23/2016 Elsevier Interactive Patient Education  2017 [employer].

## 2017-09-02 ENCOUNTER — Other Ambulatory Visit: Payer: Self-pay | Admitting: Obstetrics & Gynecology

## 2017-09-02 DIAGNOSIS — Z1231 Encounter for screening mammogram for malignant neoplasm of breast: Secondary | ICD-10-CM

## 2017-09-24 ENCOUNTER — Ambulatory Visit
Admission: RE | Admit: 2017-09-24 | Discharge: 2017-09-24 | Disposition: A | Discharge status: Home / Self Care | Payer: BC Managed Care – PPO | Source: Ambulatory Visit | Attending: Obstetrics & Gynecology | Admitting: Obstetrics & Gynecology

## 2017-09-24 DIAGNOSIS — Z1231 Encounter for screening mammogram for malignant neoplasm of breast: Secondary | ICD-10-CM | POA: Diagnosis not present

## 2017-12-11 ENCOUNTER — Ambulatory Visit (INDEPENDENT_AMBULATORY_CARE_PROVIDER_SITE_OTHER): Payer: BC Managed Care – PPO | Admitting: Family Medicine

## 2017-12-11 ENCOUNTER — Encounter: Payer: Self-pay | Admitting: Family Medicine

## 2017-12-11 VITALS — BP 108/74 | HR 75 | Temp 98.5°F | Resp 16 | Ht 66.0 in | Wt 269.0 lb

## 2017-12-11 DIAGNOSIS — J069 Acute upper respiratory infection, unspecified: Secondary | ICD-10-CM

## 2017-12-11 DIAGNOSIS — R059 Cough, unspecified: Secondary | ICD-10-CM

## 2017-12-11 DIAGNOSIS — R05 Cough: Secondary | ICD-10-CM

## 2017-12-11 MED ORDER — PREDNISONE 10 MG PO TABS: ORAL_TABLET | ORAL | 0 refills | Status: DC

## 2017-12-11 NOTE — Progress Notes (Signed)
Patient ID: Heather Case, female   DOB: Dec 22, 1970, 47 y.o.   MRN: [id number]  PCP: McLean-Scocuzza, Nino Glow, MD  Subjective:  Heather Case is a 47 y.o. year old very pleasant female patient who presents with Upper Respiratory infection symptoms including nasal congestion, rhnitis, cough that is minimally productive of clear sputum -started: one week ago , symptoms are not worsening -previous treatments: Mucinex and Robitussin have provided moderate benefit -sick contacts/travel/risks: denies flu exposure. Recent sick contact exposure; works at a community [employer] -Hx of: allergies Two weeks ago she was diagnosed with flu at the [place] walk in clinic; she was provided with tamiflu and and cough suppressant which provided great benefit. She reports full resolution of flu symptoms. She did receive flu vaccine this year.  ROS-denies fever, SOB, NVD, tooth pain  Pertinent Past Medical History-  CHF noted as pregnancy induced; she is followed by cardiology, History of prolonged Q-T interval,  Medications- reviewed  Current Outpatient Medications  Medication Sig Dispense Refill  . aspirin EC 81 MG tablet Take by mouth.    . carvedilol (COREG) 12.5 MG tablet Take 12.5 mg by mouth daily.  11  . fluticasone (FLONASE) 50 MCG/ACT nasal spray Place 2 sprays into both nostrils daily. 48 g 3  . furosemide (LASIX) 20 MG tablet Take 20 mg by mouth daily.  11  . lisinopril (PRINIVIL,ZESTRIL) 40 MG tablet Take 1 tablet (40 mg total) by mouth daily. 90 tablet 2  . loratadine (CLARITIN) 10 MG tablet Take 1 tablet (10 mg total) by mouth daily. (Patient taking differently: Take 10 mg by mouth daily as needed. ) 90 tablet 1  . Probiotic Product (PROBIOTIC-10) CAPS Take by mouth.     No current facility-administered medications for this visit.     Objective: BP 108/74 (BP Location: Left Arm, Patient Position: Sitting, Cuff Size: Large)   Pulse 75   Temp 98.5 F (36.9 C) (Oral)   Resp 16   Ht 5\' 6"   (1.676 m)   Wt 269 lb (122 kg)   LMP 12/05/2017   SpO2 95%   BMI 43.42 kg/m  Gen: NAD, resting comfortably HEENT: Turbinates mildly erythematous, TMs normal bilaterally, oropharynx clear and moist with no tonsilar exudate or edema, no sinus tenderness CV: RRR no murmurs rubs or gallops Lungs: CTAB no crackles, wheeze, rhonchi Abdomen: soft/nontender/nondistended/normal bowel sounds. No rebound or guarding.  Ext: no edema Skin: warm, dry, no rash Neuro: grossly normal, moves all extremities  Assessment/Plan:  Upper Respiratory infection History and exam today are suggestive of viral infection most likely due to upper respiratory infection. Symptomatic treatment with: flonase and claritin for symptoms. Provided short course of prednisone for congestion and cough. She states that nasal congestion is her most bothersome symptom. Advised patient on supportive measures:  Get rest, drink plenty of fluids, and follow up if fever >101, if symptoms worsen or if symptoms are not improved in 3 to days. Patient verbalizes understanding.   Further advised that she keep establish care visit that is scheduled.  We discussed that we did not find any infection that had higher probability of being bacterial such as pneumonia or strep throat. We discussed signs that bacterial infection may have developed particularly fever or shortness of breath. Likely course of 1-2 weeks. Patient is contagious and advised good handwashing and consideration of mask If going to be in public places.   Finally, we reviewed reasons to return to care including if symptoms worsen or persist or new  concerns arise- once again particularly shortness of breath or fever.   Heather Quint, FNP

## 2017-12-11 NOTE — Patient Instructions (Signed)
Please take medication with food as directed for cough.  Your symptoms are most likely related to a viral illness. Please drink plenty of water so that your urine is pale yellow or clear and get plenty of rest. Follow up if symptoms do not improve in 3 to 4 days, worsen, or you develop a fever >101.  Please keep establish care visit that you have scheduled.   Upper Respiratory Infection, Adult Most upper respiratory infections (URIs) are caused by a virus. A URI affects the nose, throat, and upper air passages. The most common type of URI is often called "the common cold." Follow these instructions at home:  Take medicines only as told by your doctor.  Gargle warm saltwater or take cough drops to comfort your throat as told by your doctor.  Use a warm mist humidifier or inhale steam from a shower to increase air moisture. This may make it easier to breathe.  Drink enough fluid to keep your pee (urine) clear or pale yellow.  Eat soups and other clear broths.  Have a healthy diet.  Rest as needed.  Go back to work when your fever is gone or your doctor says it is okay. ? You may need to stay home longer to avoid giving your URI to others. ? You can also wear a face mask and wash your hands often to prevent spread of the virus.  Use your inhaler more if you have asthma.  Do not use any tobacco products, including cigarettes, chewing tobacco, or electronic cigarettes. If you need help quitting, ask your doctor. Contact a doctor if:  You are getting worse, not better.  Your symptoms are not helped by medicine.  You have chills.  You are getting more short of breath.  You have brown or red mucus.  You have yellow or brown discharge from your nose.  You have pain in your face, especially when you bend forward.  You have a fever.  You have puffy (swollen) neck glands.  You have pain while swallowing.  You have white areas in the back of your throat. Get help right away  if:  You have very bad or constant: ? Headache. ? Ear pain. ? Pain in your forehead, behind your eyes, and over your cheekbones (sinus pain). ? Chest pain.  You have long-lasting (chronic) lung disease and any of the following: ? Wheezing. ? Long-lasting cough. ? Coughing up blood. ? A change in your usual mucus.  You have a stiff neck.  You have changes in your: ? Vision. ? Hearing. ? Thinking. ? Mood. This information is not intended to replace advice given to you by your health care provider. Make sure you discuss any questions you have with your health care provider. Document Released: 03/18/2008 Document Revised: 06/02/2016 Document Reviewed: 01/05/2014 Elsevier Interactive Patient Education  2018 [employer].

## 2017-12-26 ENCOUNTER — Ambulatory Visit: Payer: BC Managed Care – PPO | Admitting: Internal Medicine

## 2017-12-26 ENCOUNTER — Other Ambulatory Visit (HOSPITAL_COMMUNITY)
Admission: RE | Admit: 2017-12-26 | Discharge: 2017-12-26 | Disposition: A | Discharge status: Home / Self Care | Payer: BC Managed Care – PPO | Source: Ambulatory Visit | Attending: Internal Medicine | Admitting: Internal Medicine

## 2017-12-26 ENCOUNTER — Encounter: Payer: Self-pay | Admitting: Internal Medicine

## 2017-12-26 VITALS — BP 130/94 | HR 74 | Temp 98.5°F | Resp 18 | Wt 269.1 lb

## 2017-12-26 DIAGNOSIS — R202 Paresthesia of skin: Secondary | ICD-10-CM

## 2017-12-26 DIAGNOSIS — J301 Allergic rhinitis due to pollen: Secondary | ICD-10-CM | POA: Diagnosis not present

## 2017-12-26 DIAGNOSIS — Z113 Encounter for screening for infections with a predominantly sexual mode of transmission: Secondary | ICD-10-CM

## 2017-12-26 DIAGNOSIS — O903 Peripartum cardiomyopathy: Secondary | ICD-10-CM

## 2017-12-26 DIAGNOSIS — Z1329 Encounter for screening for other suspected endocrine disorder: Secondary | ICD-10-CM

## 2017-12-26 DIAGNOSIS — E611 Iron deficiency: Secondary | ICD-10-CM

## 2017-12-26 DIAGNOSIS — E559 Vitamin D deficiency, unspecified: Secondary | ICD-10-CM

## 2017-12-26 DIAGNOSIS — I10 Essential (primary) hypertension: Secondary | ICD-10-CM

## 2017-12-26 DIAGNOSIS — Z0184 Encounter for antibody response examination: Secondary | ICD-10-CM

## 2017-12-26 DIAGNOSIS — R200 Anesthesia of skin: Secondary | ICD-10-CM | POA: Diagnosis not present

## 2017-12-26 LAB — URINALYSIS, ROUTINE W REFLEX MICROSCOPIC
Bilirubin Urine: NEGATIVE
Hgb urine dipstick: NEGATIVE
Ketones, ur: NEGATIVE
Leukocytes, UA: NEGATIVE
Nitrite: NEGATIVE
PH: 7 (ref 5.0–8.0)
RBC / HPF: NONE SEEN (ref 0–?)
SPECIFIC GRAVITY, URINE: 1.01 (ref 1.000–1.030)
TOTAL PROTEIN, URINE-UPE24: NEGATIVE
URINE GLUCOSE: NEGATIVE
Urobilinogen, UA: 0.2 (ref 0.0–1.0)
WBC, UA: NONE SEEN (ref 0–?)

## 2017-12-26 LAB — CBC WITH DIFFERENTIAL/PLATELET
BASOS ABS: 0.1 10*3/uL (ref 0.0–0.1)
BASOS PCT: 1.1 % (ref 0.0–3.0)
EOS ABS: 0.2 10*3/uL (ref 0.0–0.7)
Eosinophils Relative: 4.1 % (ref 0.0–5.0)
HEMATOCRIT: 38.9 % (ref 36.0–46.0)
Hemoglobin: 13.1 g/dL (ref 12.0–15.0)
LYMPHS PCT: 37.5 % (ref 12.0–46.0)
Lymphs Abs: 2.1 10*3/uL (ref 0.7–4.0)
MCHC: 33.6 g/dL (ref 30.0–36.0)
MCV: 95.8 fl (ref 78.0–100.0)
MONO ABS: 0.4 10*3/uL (ref 0.1–1.0)
Monocytes Relative: 7 % (ref 3.0–12.0)
NEUTROS ABS: 2.8 10*3/uL (ref 1.4–7.7)
Neutrophils Relative %: 50.3 % (ref 43.0–77.0)
PLATELETS: 223 10*3/uL (ref 150.0–400.0)
RBC: 4.06 Mil/uL (ref 3.87–5.11)
RDW: 14.6 % (ref 11.5–15.5)
WBC: 5.6 10*3/uL (ref 4.0–10.5)

## 2017-12-26 LAB — COMPREHENSIVE METABOLIC PANEL
ALBUMIN: 4.5 g/dL (ref 3.5–5.2)
ALT: 23 U/L (ref 0–35)
AST: 23 U/L (ref 0–37)
Alkaline Phosphatase: 61 U/L (ref 39–117)
BILIRUBIN TOTAL: 1.1 mg/dL (ref 0.2–1.2)
BUN: 8 mg/dL (ref 6–23)
CALCIUM: 9.5 mg/dL (ref 8.4–10.5)
CO2: 28 meq/L (ref 19–32)
CREATININE: 0.78 mg/dL (ref 0.40–1.20)
Chloride: 104 mEq/L (ref 96–112)
GFR: 85.12 mL/min (ref >60.00)
Glucose, Bld: 104 mg/dL — ABNORMAL HIGH (ref 70–99)
Potassium: 3.7 mEq/L (ref 3.5–5.1)
Sodium: 136 mEq/L (ref 135–145)
Total Protein: 7.4 g/dL (ref 6.0–8.3)

## 2017-12-26 LAB — T4, FREE: FREE T4: 0.89 ng/dL (ref 0.60–1.60)

## 2017-12-26 LAB — VITAMIN D 25 HYDROXY (VIT D DEFICIENCY, FRACTURES): VITD: 6.59 ng/mL — AB (ref 30.00–100.00)

## 2017-12-26 LAB — TSH: TSH: 1.73 u[IU]/mL (ref 0.35–4.50)

## 2017-12-26 MED ORDER — FLUTICASONE PROPIONATE 50 MCG/ACT NA SUSP: 2.0000 | Freq: Every day | NASAL | 11 refills | Status: DC

## 2017-12-26 NOTE — Patient Instructions (Signed)
Please f/u in 1 month  Referred to neurology, echo Try to buy a blood pressure cuff  Hypertension Hypertension, commonly called high blood pressure, is when the force of blood pumping through the arteries is too strong. The arteries are the blood vessels that carry blood from the heart throughout the body. Hypertension forces the heart to work harder to pump blood and may cause arteries to become narrow or stiff. Having untreated or uncontrolled hypertension can cause heart attacks, strokes, kidney disease, and other problems. A blood pressure reading consists of a higher number over a lower number. Ideally, your blood pressure should be below 120/80. The first ("top") number is called the systolic pressure. It is a measure of the pressure in your arteries as your heart beats. The second ("bottom") number is called the diastolic pressure. It is a measure of the pressure in your arteries as the heart relaxes. What are the causes? The cause of this condition is not known. What increases the risk? Some risk factors for high blood pressure are under your control. Others are not. Factors you can change  Smoking.  Having type 2 diabetes mellitus, high cholesterol, or both.  Not getting enough exercise or physical activity.  Being overweight.  Having too much fat, sugar, calories, or salt (sodium) in your diet.  Drinking too much alcohol. Factors that are difficult or impossible to change  Having chronic kidney disease.  Having a family history of high blood pressure.  Age. Risk increases with age.  Race. You may be at higher risk if you are African-American.  Gender. Men are at higher risk than women before age 35. After age 35, women are at higher risk than men.  Having obstructive sleep apnea.  Stress. What are the signs or symptoms? Extremely high blood pressure (hypertensive crisis) may cause:  Headache.  Anxiety.  Shortness of breath.  Nosebleed.  Nausea and  vomiting.  Severe chest pain.  Jerky movements you cannot control (seizures).  How is this diagnosed? This condition is diagnosed by measuring your blood pressure while you are seated, with your arm resting on a surface. The cuff of the blood pressure monitor will be placed directly against the skin of your upper arm at the level of your heart. It should be measured at least twice using the same arm. Certain conditions can cause a difference in blood pressure between your right and left arms. Certain factors can cause blood pressure readings to be lower or higher than normal (elevated) for a short period of time:  When your blood pressure is higher when you are in a health care provider's office than when you are at home, this is called white coat hypertension. Most people with this condition do not need medicines.  When your blood pressure is higher at home than when you are in a health care provider's office, this is called masked hypertension. Most people with this condition may need medicines to control blood pressure.  If you have a high blood pressure reading during one visit or you have normal blood pressure with other risk factors:  You may be asked to return on a different day to have your blood pressure checked again.  You may be asked to monitor your blood pressure at home for 1 week or longer.  If you are diagnosed with hypertension, you may have other blood or imaging tests to help your health care provider understand your overall risk for other conditions. How is this treated? This condition is treated  by making healthy lifestyle changes, such as eating healthy foods, exercising more, and reducing your alcohol intake. Your health care provider may prescribe medicine if lifestyle changes are not enough to get your blood pressure under control, and if:  Your systolic blood pressure is above 130.  Your diastolic blood pressure is above 80.  Your personal target blood pressure  may vary depending on your medical conditions, your age, and other factors. Follow these instructions at home: Eating and drinking  Eat a diet that is high in fiber and potassium, and low in sodium, added sugar, and fat. An example eating plan is called the DASH (Dietary Approaches to Stop Hypertension) diet. To eat this way: ? Eat plenty of fresh fruits and vegetables. Try to fill half of your plate at each meal with fruits and vegetables. ? Eat whole grains, such as whole wheat pasta, brown rice, or whole grain bread. Fill about one quarter of your plate with whole grains. ? Eat or drink low-fat dairy products, such as skim milk or low-fat yogurt. ? Avoid fatty cuts of meat, processed or cured meats, and poultry with skin. Fill about one quarter of your plate with lean proteins, such as fish, chicken without skin, beans, eggs, and tofu. ? Avoid premade and processed foods. These tend to be higher in sodium, added sugar, and fat.  Reduce your daily sodium intake. Most people with hypertension should eat less than 1,500 mg of sodium a day.  Limit alcohol intake to no more than 1 drink a day for nonpregnant women and 2 drinks a day for men. One drink equals 12 oz of beer, 5 oz of wine, or 1 oz of hard liquor. Lifestyle  Work with your health care provider to maintain a healthy body weight or to lose weight. Ask what an ideal weight is for you.  Get at least 30 minutes of exercise that causes your heart to beat faster (aerobic exercise) most days of the week. Activities may include walking, swimming, or biking.  Include exercise to strengthen your muscles (resistance exercise), such as pilates or lifting weights, as part of your weekly exercise routine. Try to do these types of exercises for 30 minutes at least 3 days a week.  Do not use any products that contain nicotine or tobacco, such as cigarettes and e-cigarettes. If you need help quitting, ask your health care provider.  Monitor your  blood pressure at home as told by your health care provider.  Keep all follow-up visits as told by your health care provider. This is important. Medicines  Take over-the-counter and prescription medicines only as told by your health care provider. Follow directions carefully. Blood pressure medicines must be taken as prescribed.  Do not skip doses of blood pressure medicine. Doing this puts you at risk for problems and can make the medicine less effective.  Ask your health care provider about side effects or reactions to medicines that you should watch for. Contact a health care provider if:  You think you are having a reaction to a medicine you are taking.  You have headaches that keep coming back (recurring).  You feel dizzy.  You have swelling in your ankles.  You have trouble with your vision. Get help right away if:  You develop a severe headache or confusion.  You have unusual weakness or numbness.  You feel faint.  You have severe pain in your chest or abdomen.  You vomit repeatedly.  You have trouble breathing. Summary  Hypertension  is when the force of blood pumping through your arteries is too strong. If this condition is not controlled, it may put you at risk for serious complications.  Your personal target blood pressure may vary depending on your medical conditions, your age, and other factors. For most people, a normal blood pressure is less than 120/80.  Hypertension is treated with lifestyle changes, medicines, or a combination of both. Lifestyle changes include weight loss, eating a healthy, low-sodium diet, exercising more, and limiting alcohol. This information is not intended to replace advice given to you by your health care provider. Make sure you discuss any questions you have with your health care provider. Document Released: 09/30/2005 Document Revised: 08/28/2016 Document Reviewed: 08/28/2016 Elsevier Interactive Patient Education  [employer].

## 2017-12-27 ENCOUNTER — Other Ambulatory Visit: Payer: Self-pay | Admitting: Internal Medicine

## 2017-12-27 DIAGNOSIS — E559 Vitamin D deficiency, unspecified: Secondary | ICD-10-CM

## 2017-12-27 MED ORDER — CHOLECALCIFEROL 1.25 MG (50000 UT) PO CAPS: 50000.0000 [IU] | ORAL_CAPSULE | ORAL | 1 refills | Status: DC

## 2017-12-29 ENCOUNTER — Encounter: Payer: Self-pay | Admitting: Internal Medicine

## 2017-12-29 LAB — URINE CYTOLOGY ANCILLARY ONLY
Chlamydia: NEGATIVE
Neisseria Gonorrhea: NEGATIVE
TRICH (WINDOWPATH): NEGATIVE

## 2017-12-29 LAB — RPR: RPR Ser Ql: NONREACTIVE

## 2017-12-29 LAB — HEPATITIS B SURFACE ANTIGEN: Hepatitis B Surface Ag: NONREACTIVE

## 2017-12-29 LAB — HSV 2 ANTIBODY, IGG: HSV 2 GLYCOPROTEIN G AB, IGG: 17.4 {index} — AB

## 2017-12-29 LAB — IRON,TIBC AND FERRITIN PANEL
%SAT: 28 % (ref 11–50)
FERRITIN: 200 ng/mL (ref 10–232)
Iron: 77 ug/dL (ref 40–190)
TIBC: 272 mcg/dL (calc) (ref 250–450)

## 2017-12-29 LAB — HSV 1 ANTIBODY, IGG

## 2017-12-29 LAB — HEPATITIS B SURFACE ANTIBODY, QUANTITATIVE

## 2017-12-29 LAB — HIV ANTIBODY (ROUTINE TESTING W REFLEX): HIV: NONREACTIVE

## 2017-12-29 LAB — HEPATITIS C ANTIBODY
HEP C AB: NONREACTIVE
SIGNAL TO CUT-OFF: 0.03 (ref <1.00)

## 2017-12-29 NOTE — Progress Notes (Addendum)
Chief Complaint  Patient presents with  . Establish Care   Establish care  1. She c/o numbness and tingling in all fingers of left hand it is not constant no shoulder, elbow, neck pain. Sx's noted 1 month ago intermittent  2. 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 His last note 04/2017 rec repeat echo but she has not had denies leg edema appt with cards 04/2018 will go ahead and order echo now for appt in 04/2018  3. Would like refill of flonase  4. HTN elevated today 140/100 repeat 132/94 took meds about 9 am this am Coreg 12.5 mg bid, lasix 20 mg qd, lisinopril 40 mg qd.   5. Agreeable to STD check today    Review of Systems  Constitutional: Negative for weight loss.  HENT: Negative for hearing loss.   Eyes: Negative for blurred vision.  Respiratory: Negative for shortness of breath.   Cardiovascular: Negative for chest pain and leg swelling.  Gastrointestinal: Negative for abdominal pain.  Musculoskeletal: Negative for joint pain and neck pain.  Skin: Negative for rash.  Neurological: Positive for sensory change and headaches.  Psychiatric/Behavioral: Negative for depression.   Past Medical History:  Diagnosis Date  . Allergic rhinitis, seasonal   . History of CHF (congestive heart failure)   . Hordeolum externum of left eye   . HTN, goal below 140/90   . Left ear pain   . Obesity, Class III, BMI 40-49.9 (morbid obesity) ([place])    Past Surgical History:  Procedure Laterality Date  . BREAST BIOPSY Right 1994   Negative  . BREAST LUMPECTOMY     benign (at age 24yr)  . CESAREAN SECTION     x's 2  . COLONOSCOPY  12/2008   Dr. Kenton Kingfisher, follow-up ASCUS   Family History  Problem Relation Age of Onset  . Cancer Mother        breast  . Breast cancer Mother   . Hypertension Father   . Diabetes Father   . Cancer Maternal Grandmother        breast  . Breast cancer Maternal Grandmother   . Breast cancer Paternal Grandmother    Social History    Socioeconomic History  . Marital status: Unknown    Spouse name: Not on file  . Number of children: Not on file  . Years of education: Not on file  . Highest education level: Not on file  Social Needs  . Financial resource strain: Not on file  . Food insecurity - worry: Not on file  . Food insecurity - inability: Not on file  . Transportation needs - medical: Not on file  . Transportation needs - non-medical: Not on file  Occupational History  . Not on file  Tobacco Use  . Smoking status: Former Research scientist (life sciences)  . Smokeless tobacco: Never Used  . Tobacco comment: quit in 2010. Used to smoke 1/2 PPD.  Substance and Sexual Activity  . Alcohol use: Yes    Alcohol/week: 0.0 oz    Comment: occasional  . Drug use: No  . Sexual activity: Yes    Partners: Male    Birth control/protection: Condom  Other Topics Concern  . Not on file  Social History Narrative   Works [employer]   No outpatient medications have been marked as taking for the 12/26/17 encounter (Office Visit) with McLean-Scocuzza, Nino Glow, MD.   Allergies  Allergen Reactions  . Penicillin V Rash   Recent Results (from the past 2160 hour(s))  Comprehensive  metabolic panel     Status: Abnormal   Collection Time: 12/26/17 11:10 AM  Result Value Ref Range   Sodium 136 135 - 145 mEq/L   Potassium 3.7 3.5 - 5.1 mEq/L   Chloride 104 96 - 112 mEq/L   CO2 28 19 - 32 mEq/L   Glucose, Bld 104 (H) 70 - 99 mg/dL   BUN 8 6 - 23 mg/dL   Creatinine, Ser 0.78 0.40 - 1.20 mg/dL   Total Bilirubin 1.1 0.2 - 1.2 mg/dL   Alkaline Phosphatase 61 39 - 117 U/L   AST 23 0 - 37 U/L   ALT 23 0 - 35 U/L   Total Protein 7.4 6.0 - 8.3 g/dL   Albumin 4.5 3.5 - 5.2 g/dL   Calcium 9.5 8.4 - 10.5 mg/dL   GFR 85.12 >60.00 mL/min  CBC with Differential/Platelet     Status: None   Collection Time: 12/26/17 11:10 AM  Result Value Ref Range   WBC 5.6 4.0 - 10.5 K/uL   RBC 4.06 3.87 - 5.11 Mil/uL   Hemoglobin 13.1 12.0 - 15.0 g/dL   HCT 38.9 36.0 - 46.0  %   MCV 95.8 78.0 - 100.0 fl   MCHC 33.6 30.0 - 36.0 g/dL   RDW 14.6 11.5 - 15.5 %   Platelets 223.0 150.0 - 400.0 K/uL   Neutrophils Relative % 50.3 43.0 - 77.0 %   Lymphocytes Relative 37.5 12.0 - 46.0 %   Monocytes Relative 7.0 3.0 - 12.0 %   Eosinophils Relative 4.1 0.0 - 5.0 %   Basophils Relative 1.1 0.0 - 3.0 %   Neutro Abs 2.8 1.4 - 7.7 K/uL   Lymphs Abs 2.1 0.7 - 4.0 K/uL   Monocytes Absolute 0.4 0.1 - 1.0 K/uL   Eosinophils Absolute 0.2 0.0 - 0.7 K/uL   Basophils Absolute 0.1 0.0 - 0.1 K/uL  TSH     Status: None   Collection Time: 12/26/17 11:10 AM  Result Value Ref Range   TSH 1.73 0.35 - 4.50 uIU/mL  T4, free     Status: None   Collection Time: 12/26/17 11:10 AM  Result Value Ref Range   Free T4 0.89 0.60 - 1.60 ng/dL    Comment: Specimens from patients who are undergoing biotin therapy and /or ingesting biotin supplements may contain high levels of biotin.  The higher biotin concentration in these specimens interferes with this Free T4 assay.  Specimens that contain high levels  of biotin may cause false high results for this Free T4 assay.  Please interpret results in light of the total clinical presentation of the patient.    Urinalysis, Routine w reflex microscopic     Status: None   Collection Time: 12/26/17 11:10 AM  Result Value Ref Range   Color, Urine YELLOW Yellow;Lt. Yellow   APPearance CLEAR Clear   Specific Gravity, Urine 1.010 1.000 - 1.030   pH 7.0 5.0 - 8.0   Total Protein, Urine NEGATIVE Negative   Urine Glucose NEGATIVE Negative   Ketones, ur NEGATIVE Negative   Bilirubin Urine NEGATIVE Negative   Hgb urine dipstick NEGATIVE Negative   Urobilinogen, UA 0.2 0.0 - 1.0   Leukocytes, UA NEGATIVE Negative   Nitrite NEGATIVE Negative   WBC, UA none seen 0-2/hpf   RBC / HPF none seen 0-2/hpf   Squamous Epithelial / LPF Rare(0-4/hpf) Rare(0-4/hpf)  Iron, TIBC and Ferritin Panel     Status: None   Collection Time: 12/26/17 11:10 AM  Result Value  Ref  Range   Iron 77 40 - 190 mcg/dL   TIBC 272 250 - 450 mcg/dL (calc)   %SAT 28 11 - 50 % (calc)   Ferritin 200 10 - 232 ng/mL  Vitamin D (25 hydroxy)     Status: Abnormal   Collection Time: 12/26/17 11:10 AM  Result Value Ref Range   VITD 6.59 (L) 30.00 - 100.00 ng/mL  Hepatitis B surface antibody     Status: Abnormal   Collection Time: 12/26/17 11:10 AM  Result Value Ref Range   Hepatitis B-Post <5 (L) > OR = 10 mIU/mL    Comment: . Patient does not have immunity to hepatitis B virus. . For additional information, please refer to http://education.questdiagnostics.com/faq/FAQ105 (This link is being provided for informational/ educational purposes only).   HIV antibody (with reflex)     Status: None   Collection Time: 12/26/17 11:10 AM  Result Value Ref Range   HIV 1&2 Ab, 4th Generation NON-REACTIVE NON-REACTI    Comment: HIV-1 antigen and HIV-1/HIV-2 antibodies were not detected. There is no laboratory evidence of HIV infection. Marland Kitchen PLEASE NOTE: This information has been disclosed to you from records whose confidentiality may be protected by state law.  If your state requires such protection, then the state law prohibits you from making any further disclosure of the information without the specific written consent of the person to whom it pertains, or as otherwise permitted by law. A general authorization for the release of medical or other information is NOT sufficient for this purpose. . For additional information please refer to http://education.questdiagnostics.com/faq/FAQ106 (This link is being provided for informational/ educational purposes only.) . Marland Kitchen The performance of this assay has not been clinically validated in patients less than 3 years old. .   Hepatitis B surface antigen     Status: None   Collection Time: 12/26/17 11:10 AM  Result Value Ref Range   Hepatitis B Surface Ag NON-REACTIVE NON-REACTI  Hepatitis C antibody     Status: None   Collection  Time: 12/26/17 11:10 AM  Result Value Ref Range   Hepatitis C Ab NON-REACTIVE NON-REACTI   SIGNAL TO CUT-OFF 0.03 <1.00    Comment: . HCV antibody was non-reactive. There is no laboratory  evidence of HCV infection. . In most cases, no further action is required. However, if recent HCV exposure is suspected, a test for HCV RNA (test code [phone]) is suggested. . For additional information please refer to http://education.questdiagnostics.com/faq/FAQ22v1 (This link is being provided for informational/ educational purposes only.) .    Objective  Body mass index is 43.44 kg/m. Wt Readings from Last 3 Encounters:  12/26/17 269 lb 2 oz (122.1 kg)  12/11/17 269 lb (122 kg)  01/13/17 270 lb 1.6 oz (122.5 kg)   Temp Readings from Last 3 Encounters:  12/26/17 98.5 F (36.9 C) (Oral)  12/11/17 98.5 F (36.9 C) (Oral)  01/13/17 97.9 F (36.6 C) (Oral)   BP Readings from Last 3 Encounters:  12/26/17 (!) 130/94  12/11/17 108/74  01/13/17 132/84   Pulse Readings from Last 3 Encounters:  12/26/17 74  12/11/17 75  01/13/17 80   O2 sat room air 96%  Physical Exam  Constitutional: She is oriented to person, place, and time and well-developed, well-nourished, and in no distress.  HENT:  Head: Normocephalic and atraumatic.  Mouth/Throat: Oropharynx is clear and moist and mucous membranes are normal.  Eyes: Conjunctivae are normal. Pupils are equal, round, and reactive to light.  Neck: Normal range  of motion.  Cardiovascular: Normal rate, regular rhythm and normal heart sounds.  Pulmonary/Chest: Effort normal and breath sounds normal.  Abdominal: Soft. Bowel sounds are normal.  Musculoskeletal:  Neg tinels left wrist   Neurological: She is alert and oriented to person, place, and time. Gait normal. Gait normal.  Skin: Skin is warm, dry and intact.  Psychiatric: Mood, memory, affect and judgment normal.  Nursing note and vitals reviewed.   Assessment   1. Left fingers with  numbness and tingling ddx CTS, ulnar neuropathy vs other  2. Postpartum cardiomyopathy  3. HTN uncontrolled today  4. Allergic rhinitis  5. HM Plan   1. Refer to neurology for EMG/NCS 2. Cont lasix 20 mg qd, lis 40 mg qd, coreg 12.5 mg qd Repeat echo ordered  F/u cards Dr. Georgina Peer 04/2018  3. Repeat BP 132/94 on meds as above if BP elevated at f/u add norvasc 2.5 normally BP controlled  rec purchase BP cuff and check at home  F/u in 1 month  4. Refilled flonase  5.  Had flu shot  Tdap disc at f/u  rec hep B vaccine  Check labs CMET, CBC,TSH, T4, UA, anemia panel h/o iron def, vit D, hep B/C other STD check today  Lipid at f/u fasting will check   [facility] for pap will get records [facility] last pap 2018 h/o trich noted reviewed with pt. Has IUD x 4 years and due for new one 10/2018  mammo 09/24/17 neg insurance would not cover genetic testing for breast cancer as mom and both grandmothers dx'ed breast cancer at older age per pt  Dad h/o colon cancer ask pt at f/u age he was dx'ed as she will need colonoscopy 10 years prior to his dx.   "I spent 30-35 minutes face-to face with patient with greater than 50% of time spent counseling and/or in coordination of care reviewing CC, PMH, and coordination of care.   Provider: Dr. Olivia Mackie McLean-Scocuzza-Internal Medicine

## 2017-12-31 ENCOUNTER — Telehealth: Payer: Self-pay | Admitting: Internal Medicine

## 2017-12-31 LAB — URINE CYTOLOGY ANCILLARY ONLY

## 2017-12-31 NOTE — Telephone Encounter (Addendum)
Lab results and recommendations given to patient with verbal understanding.

## 2018-01-07 ENCOUNTER — Other Ambulatory Visit: Payer: Self-pay | Admitting: Internal Medicine

## 2018-01-07 MED ORDER — METRONIDAZOLE 500 MG PO TABS: 500.0000 mg | ORAL_TABLET | Freq: Two times a day (BID) | ORAL | 0 refills | Status: DC

## 2018-01-13 ENCOUNTER — Ambulatory Visit: Payer: BC Managed Care – PPO | Admitting: Family Medicine

## 2018-01-29 ENCOUNTER — Ambulatory Visit (INDEPENDENT_AMBULATORY_CARE_PROVIDER_SITE_OTHER): Payer: BC Managed Care – PPO | Admitting: Internal Medicine

## 2018-01-29 ENCOUNTER — Encounter: Payer: Self-pay | Admitting: Internal Medicine

## 2018-01-29 VITALS — BP 128/80 | HR 77 | Temp 97.8°F | Ht 66.0 in | Wt 273.6 lb

## 2018-01-29 DIAGNOSIS — I10 Essential (primary) hypertension: Secondary | ICD-10-CM

## 2018-01-29 DIAGNOSIS — I429 Cardiomyopathy, unspecified: Secondary | ICD-10-CM

## 2018-01-29 DIAGNOSIS — Z23 Encounter for immunization: Secondary | ICD-10-CM

## 2018-01-29 DIAGNOSIS — E559 Vitamin D deficiency, unspecified: Secondary | ICD-10-CM

## 2018-01-29 NOTE — Progress Notes (Signed)
No chief complaint on file.  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 HTN controlled coreg 12.5 bid, lasix 20 mg qd, lis 40   Review of Systems  Constitutional: Negative for weight loss.  HENT: Negative for hearing loss.   Eyes: Negative for blurred vision.  Respiratory: Negative for shortness of breath.   Cardiovascular: Negative for chest pain.  Gastrointestinal: Negative for abdominal pain.  Skin: Negative for rash.  Neurological: Positive for sensory change.   Past Medical History:  Diagnosis Date  . Allergic rhinitis, seasonal   . History of CHF (congestive heart failure)   . Hordeolum externum of left eye   . HTN, goal below 140/90   . Left ear pain   . Obesity, Class III, BMI 40-49.9 (morbid obesity) ([place])    Past Surgical History:  Procedure Laterality Date  . BREAST BIOPSY Right 1994   Negative  . BREAST LUMPECTOMY     benign (at age 60yr)  . CESAREAN SECTION     x's 2  . COLONOSCOPY  12/2008   Dr. Kenton Kingfisher, follow-up ASCUS   Family History  Problem Relation Age of Onset  . Cancer Mother        breast dx'ed in 56s   . Breast cancer Mother   . Hypertension Father   . Diabetes Father   . Cancer Father        colon cancer   . Cancer Maternal Grandmother        breast dx later in life   . Breast cancer Maternal Grandmother   . Breast cancer Paternal Grandmother   . Cancer Paternal Grandmother        breast cancer dx'ed later in life    Social History   Socioeconomic History  . Marital status: Unknown    Spouse name: Not on file  . Number of children: Not on file  . Years of education: Not on file  . Highest education level: Not on file  Occupational History  . Not on file  Social Needs  . Financial resource strain: Not on file  . Food insecurity:    Worry: Not on file    Inability: Not on file  . Transportation needs:    Medical: Not on file    Non-medical: Not on file  Tobacco Use  . Smoking status: Former  Research scientist (life sciences)  . Smokeless tobacco: Never Used  . Tobacco comment: quit in 2010. Used to smoke 1/2 PPD.  Substance and Sexual Activity  . Alcohol use: Yes    Alcohol/week: 0.0 oz    Comment: occasional  . Drug use: No  . Sexual activity: Yes    Partners: Male    Birth control/protection: Condom  Lifestyle  . Physical activity:    Days per week: Not on file    Minutes per session: Not on file  . Stress: Not on file  Relationships  . Social connections:    Talks on phone: Not on file    Gets together: Not on file    Attends religious service: Not on file    Active member of club or organization: Not on file    Attends meetings of clubs or organizations: Not on file    Relationship status: Not on file  . Intimate partner violence:    Fear of current or ex partner: Not on file    Emotionally abused: Not on file    Physically abused: Not on file  Forced sexual activity: Not on file  Other Topics Concern  . Not on file  Social History Narrative   Works [employer], former CMA training    No outpatient medications have been marked as taking for the 01/29/18 encounter (Appointment) with McLean-Scocuzza, Nino Glow, MD.   Allergies  Allergen Reactions  . Penicillin V Rash   Recent Results (from the past 2160 hour(s))  Urine cytology ancillary only     Status: None   Collection Time: 12/26/17 12:00 AM  Result Value Ref Range   Chlamydia Negative     Comment: Normal Reference Range - Negative   Neisseria gonorrhea Negative     Comment: Normal Reference Range - Negative   Trichomonas Negative     Comment: Normal Reference Range - Negative  Urine cytology ancillary only     Status: Abnormal   Collection Time: 12/26/17 12:00 AM  Result Value Ref Range   Bacterial vaginitis (A)     **POSITIVE for Atopobium vaginae, POSITIVE for Megasphaera 2, POSITIVE for Gardnerella vaginalis, POSITIVE for BVAB2**    Comment: Normal Reference Range - Negative  Comprehensive metabolic panel     Status:  Abnormal   Collection Time: 12/26/17 11:10 AM  Result Value Ref Range   Sodium 136 135 - 145 mEq/L   Potassium 3.7 3.5 - 5.1 mEq/L   Chloride 104 96 - 112 mEq/L   CO2 28 19 - 32 mEq/L   Glucose, Bld 104 (H) 70 - 99 mg/dL   BUN 8 6 - 23 mg/dL   Creatinine, Ser 0.78 0.40 - 1.20 mg/dL   Total Bilirubin 1.1 0.2 - 1.2 mg/dL   Alkaline Phosphatase 61 39 - 117 U/L   AST 23 0 - 37 U/L   ALT 23 0 - 35 U/L   Total Protein 7.4 6.0 - 8.3 g/dL   Albumin 4.5 3.5 - 5.2 g/dL   Calcium 9.5 8.4 - 10.5 mg/dL   GFR 85.12 >60.00 mL/min  CBC with Differential/Platelet     Status: None   Collection Time: 12/26/17 11:10 AM  Result Value Ref Range   WBC 5.6 4.0 - 10.5 K/uL   RBC 4.06 3.87 - 5.11 Mil/uL   Hemoglobin 13.1 12.0 - 15.0 g/dL   HCT 38.9 36.0 - 46.0 %   MCV 95.8 78.0 - 100.0 fl   MCHC 33.6 30.0 - 36.0 g/dL   RDW 14.6 11.5 - 15.5 %   Platelets 223.0 150.0 - 400.0 K/uL   Neutrophils Relative % 50.3 43.0 - 77.0 %   Lymphocytes Relative 37.5 12.0 - 46.0 %   Monocytes Relative 7.0 3.0 - 12.0 %   Eosinophils Relative 4.1 0.0 - 5.0 %   Basophils Relative 1.1 0.0 - 3.0 %   Neutro Abs 2.8 1.4 - 7.7 K/uL   Lymphs Abs 2.1 0.7 - 4.0 K/uL   Monocytes Absolute 0.4 0.1 - 1.0 K/uL   Eosinophils Absolute 0.2 0.0 - 0.7 K/uL   Basophils Absolute 0.1 0.0 - 0.1 K/uL  TSH     Status: None   Collection Time: 12/26/17 11:10 AM  Result Value Ref Range   TSH 1.73 0.35 - 4.50 uIU/mL  T4, free     Status: None   Collection Time: 12/26/17 11:10 AM  Result Value Ref Range   Free T4 0.89 0.60 - 1.60 ng/dL    Comment: Specimens from patients who are undergoing biotin therapy and /or ingesting biotin supplements may contain high levels of biotin.  The higher biotin concentration in  these specimens interferes with this Free T4 assay.  Specimens that contain high levels  of biotin may cause false high results for this Free T4 assay.  Please interpret results in light of the total clinical presentation of the patient.     Urinalysis, Routine w reflex microscopic     Status: None   Collection Time: 12/26/17 11:10 AM  Result Value Ref Range   Color, Urine YELLOW Yellow;Lt. Yellow   APPearance CLEAR Clear   Specific Gravity, Urine 1.010 1.000 - 1.030   pH 7.0 5.0 - 8.0   Total Protein, Urine NEGATIVE Negative   Urine Glucose NEGATIVE Negative   Ketones, ur NEGATIVE Negative   Bilirubin Urine NEGATIVE Negative   Hgb urine dipstick NEGATIVE Negative   Urobilinogen, UA 0.2 0.0 - 1.0   Leukocytes, UA NEGATIVE Negative   Nitrite NEGATIVE Negative   WBC, UA none seen 0-2/hpf   RBC / HPF none seen 0-2/hpf   Squamous Epithelial / LPF Rare(0-4/hpf) Rare(0-4/hpf)  Iron, TIBC and Ferritin Panel     Status: None   Collection Time: 12/26/17 11:10 AM  Result Value Ref Range   Iron 77 40 - 190 mcg/dL   TIBC 272 250 - 450 mcg/dL (calc)   %SAT 28 11 - 50 % (calc)   Ferritin 200 10 - 232 ng/mL  Vitamin D (25 hydroxy)     Status: Abnormal   Collection Time: 12/26/17 11:10 AM  Result Value Ref Range   VITD 6.59 (L) 30.00 - 100.00 ng/mL  Hepatitis B surface antibody     Status: Abnormal   Collection Time: 12/26/17 11:10 AM  Result Value Ref Range   Hepatitis B-Post <5 (L) > OR = 10 mIU/mL    Comment: . Patient does not have immunity to hepatitis B virus. . For additional information, please refer to http://education.[facility].com/faq/FAQ105 (This link is being provided for informational/ educational purposes only).   HIV antibody (with reflex)     Status: None   Collection Time: 12/26/17 11:10 AM  Result Value Ref Range   HIV 1&2 Ab, 4th Generation NON-REACTIVE NON-REACTI    Comment: HIV-1 antigen and HIV-1/HIV-2 antibodies were not detected. There is no laboratory evidence of HIV infection. Marland Kitchen PLEASE NOTE: This information has been disclosed to you from records whose confidentiality may be protected by state law.  If your state requires such protection, then the state law prohibits you  from making any further disclosure of the information without the specific written consent of the person to whom it pertains, or as otherwise permitted by law. A general authorization for the release of medical or other information is NOT sufficient for this purpose. . For additional information please refer to http://education.[facility].com/faq/FAQ106 (This link is being provided for informational/ educational purposes only.) . Marland Kitchen The performance of this assay has not been clinically validated in patients less than 85 years old. .   RPR     Status: None   Collection Time: 12/26/17 11:10 AM  Result Value Ref Range   RPR Ser Ql NON-REACTIVE NON-REACTI  Hepatitis B surface antigen     Status: None   Collection Time: 12/26/17 11:10 AM  Result Value Ref Range   Hepatitis B Surface Ag NON-REACTIVE NON-REACTI  Hepatitis C antibody     Status: None   Collection Time: 12/26/17 11:10 AM  Result Value Ref Range   Hepatitis C Ab NON-REACTIVE NON-REACTI   SIGNAL TO CUT-OFF 0.03 <1.00    Comment: . HCV antibody was non-reactive. There is  no laboratory  evidence of HCV infection. . In most cases, no further action is required. However, if recent HCV exposure is suspected, a test for HCV RNA (test code [phone]) is suggested. . For additional information please refer to http://education.[facility].com/faq/FAQ22v1 (This link is being provided for informational/ educational purposes only.) .   HSV 1 antibody, IgG     Status: None   Collection Time: 12/26/17 11:10 AM  Result Value Ref Range   HSV 1 Glycoprotein G Ab, IgG <0.90 index    Comment:                           Index          Interpretation                           -----          --------------                           <0.90          Negative                           0.90-1.09      Equivocal                           >1.09          Positive . This assay utilizes recombinant type-specific antigens to  differentiate HSV-1 from HSV-2 infections. A positive result cannot distinguish between recent and past infection. If recent HSV infection is suspected but the results are negative or equivocal, the assay should be repeated in 4-6 weeks. The performance characteristics of the assay have not been established for pediatric populations, immunocompromised patients, or neonatal screening.   HSV 2 antibody, IgG     Status: Abnormal   Collection Time: 12/26/17 11:10 AM  Result Value Ref Range   HSV 2 Glycoprotein G Ab, IgG 17.40 (H) index    Comment:                           Index          Interpretation                           -----          --------------                           <0.90          Negative                           0.90-1.09      Equivocal                           >1.09          Positive . This assay utilizes recombinant type-specific antigens to differentiate HSV-1 from HSV-2 infections. A positive result cannot distinguish between recent and past infection. If recent HSV infection is suspected but the results are negative or equivocal, the assay should be repeated in 4-6 weeks. The performance  characteristics of the assay have not been established for pediatric populations, immunocompromised patients, or neonatal screening.    Objective  There is no height or weight on file to calculate BMI. Wt Readings from Last 3 Encounters:  12/26/17 269 lb 2 oz (122.1 kg)  12/11/17 269 lb (122 kg)  01/13/17 270 lb 1.6 oz (122.5 kg)   Temp Readings from Last 3 Encounters:  12/26/17 98.5 F (36.9 C) (Oral)  12/11/17 98.5 F (36.9 C) (Oral)  01/13/17 97.9 F (36.6 C) (Oral)   BP Readings from Last 3 Encounters:  12/26/17 (!) 130/94  12/11/17 108/74  01/13/17 132/84   Pulse Readings from Last 3 Encounters:  12/26/17 74  12/11/17 75  01/13/17 80    Physical Exam  Constitutional: She is oriented to person, place, and time. Vital signs are normal. She appears  well-developed and well-nourished.  HENT:  Head: Normocephalic and atraumatic.  Mouth/Throat: Oropharynx is clear and moist and mucous membranes are normal.  Eyes: Pupils are equal, round, and reactive to light. Conjunctivae are normal.  Cardiovascular: Normal rate, regular rhythm and normal heart sounds.  Pulmonary/Chest: Effort normal and breath sounds normal.  Neurological: She is alert and oriented to person, place, and time. Gait normal.  Skin: Skin is warm, dry and intact.  Psychiatric: She has a normal mood and affect. Her speech is normal and behavior is normal. Judgment and thought content normal. Cognition and memory are normal.  Nursing note and vitals reviewed.   Assessment   1. HSV 2  2. Vit D def  3. HTN  4. HM Plan  1. Ed today  2. Cont x 6 months then take D3 OTC 5K IU qd  3. Cont meds controlled  4.  Had flu shot  Tdap and hep B today    Lipid future   [facility] for pap will get records [facility] last pap 2018 h/o trich noted reviewed with pt. Has IUD x 4 years and due for new one 10/2018  -need to get records   mammo 09/24/17 neg insurance would not cover genetic testing for breast cancer as mom and both grandmothers dx'ed breast cancer at older age per pt  Dad h/o colon cancer ask pt at f/u age he was dx'ed as she will need colonoscopy 10 years prior to his dx.     Provider: Dr. Olivia Mackie McLean-Scocuzza-Internal Medicine

## 2018-01-29 NOTE — Progress Notes (Signed)
Pre visit review using our clinic review tool, if applicable. No additional management support is needed unless otherwise documented below in the visit note. 

## 2018-01-29 NOTE — Patient Instructions (Addendum)
Also schedule fasting lipid  Hep B you will need 2nd dose 5/18 and 3rd dose 07/31/18 nurse visit  Please f/u in 6 months sooner if needed   DTaP Vaccine (Diphtheria, Tetanus, and Pertussis): What You Need to Know 1. Why get vaccinated? Diphtheria, tetanus, and pertussis are serious diseases caused by bacteria. Diphtheria and pertussis are spread from person to person. Tetanus enters the body through cuts or wounds. DIPHTHERIA causes a thick covering in the back of the throat.  It can lead to breathing problems, paralysis, heart failure, and even death.  TETANUS (Lockjaw) causes painful tightening of the muscles, usually all over the body.  It can lead to "locking" of the jaw so the victim cannot open his mouth or swallow. Tetanus leads to death in up to 2 out of 10 cases.  PERTUSSIS (Whooping Cough) causes coughing spells so bad that it is hard for infants to eat, drink, or breathe. These spells can last for weeks.  It can lead to pneumonia, seizures (jerking and staring spells), brain damage, and death.  Diphtheria, tetanus, and pertussis vaccine (DTaP) can help prevent these diseases. Most children who are vaccinated with DTaP will be protected throughout childhood. Many more children would get these diseases if we stopped vaccinating. DTaP is a safer version of an older vaccine called DTP. DTP is no longer used in the [place]. 2. Who should get DTaP vaccine and when? Children should get 5 doses of DTaP vaccine, one dose at each of the following ages:  2 months  4 months  6 months  15-18 months  4-6 years  DTaP may be given at the same time as other vaccines. 3. Some children should not get DTaP vaccine or should wait  Children with minor illnesses, such as a cold, may be vaccinated. But children who are moderately or severely ill should usually wait until they recover before getting DTaP vaccine.  Any child who had a life-threatening allergic reaction after a dose of  DTaP should not get another dose.  Any child who suffered a brain or nervous system disease within 7 days after a dose of DTaP should not get another dose.  Talk with your doctor if your child: ? had a seizure or collapsed after a dose of DTaP, ? cried non-stop for 3 hours or more after a dose of DTaP, ? had a fever over 105F after a dose of DTaP. Ask your doctor for more information. Some of these children should not get another dose of pertussis vaccine, but may get a vaccine without pertussis, called DT. 4. Older children and adults DTaP is not licensed for adolescents, adults, or children 84 years of age and older. But older people still need protection. A vaccine called Tdap is similar to DTaP. A single dose of Tdap is recommended for people 11 through 45 years of age. Another vaccine, called Td, protects against tetanus and diphtheria, but not pertussis. It is recommended every 10 years. There are separate Vaccine Information Statements for these vaccines. 5. What are the risks from DTaP vaccine? Getting diphtheria, tetanus, or pertussis disease is much riskier than getting DTaP vaccine. However, a vaccine, like any medicine, is capable of causing serious problems, such as severe allergic reactions. The risk of DTaP vaccine causing serious harm, or death, is extremely small. Mild problems (common)  Fever (up to about 1 child in 4)  Redness or swelling where the shot was given (up to about 1 child in 4)  Soreness or  tenderness where the shot was given (up to about 1 child in 4) These problems occur more often after the 4th and 5th doses of the DTaP series than after earlier doses. Sometimes the 4th or 5th dose of DTaP vaccine is followed by swelling of the entire arm or leg in which the shot was given, lasting 1-7 days (up to about 1 child in 78). Other mild problems include:  Fussiness (up to about 1 child in 3)  Tiredness or poor appetite (up to about 1 child in 10)  Vomiting (up  to about 1 child in 26) These problems generally occur 1-3 days after the shot. Moderate problems (uncommon)  Seizure (jerking or staring) (about 1 child out of 14,000)  Non-stop crying, for 3 hours or more (up to about 1 child out of 1,000)  High fever, over 105F (about 1 child out of 16,000) Severe problems (very rare)  Serious allergic reaction (less than 1 out of a million doses)  Several other severe problems have been reported after DTaP vaccine. These include: ? Long-term seizures, coma, or lowered consciousness ? Permanent brain damage. These are so rare it is hard to tell if they are caused by the vaccine. Controlling fever is especially important for children who have had seizures, for any reason. It is also important if another family member has had seizures. You can reduce fever and pain by giving your child an aspirin-free pain reliever when the shot is given, and for the next 24 hours, following the package instructions. 6. What if there is a serious reaction? What should I look for? Look for anything that concerns you, such as signs of a severe allergic reaction, very high fever, or behavior changes. Signs of a severe allergic reaction can include hives, swelling of the face and throat, difficulty breathing, a fast heartbeat, dizziness, and weakness. These would start a few minutes to a few hours after the vaccination. What should I do?  If you think it is a severe allergic reaction or other emergency that can't wait, call 9-1-1 or get the person to the nearest hospital. Otherwise, call your doctor.  Afterward, the reaction should be reported to the Vaccine Adverse Event Reporting System (VAERS). Your doctor might file this report, or you can do it yourself through the VAERS web site at www.vaers.SamedayNews.es, or by calling 430-602-0232. ? VAERS is only for reporting reactions. They do not give medical advice. 7. The National Vaccine Injury Compensation Program The [employer]  Vaccine Injury Compensation Program (VICP) is a federal program that was created to compensate people who may have been injured by certain vaccines. Persons who believe they may have been injured by a vaccine can learn about the program and about filing a claim by calling 3851124139 or visiting the [place] website at GoldCloset.com.ee. 8. How can I learn more?  Ask your doctor.  Call your local or state health department.  Contact the Centers for Disease Control and Prevention (CDC): ? Call (913) 240-3184 (1-800-CDC-INFO) or ? Visit CDC's website at http://hunter.com/ CDC DTaP Vaccine (Diphtheria, Tetanus, and Pertussis) VIS (02/27/06) This information is not intended to replace advice given to you by your health care provider. Make sure you discuss any questions you have with your health care provider. Document Released: 07/28/2006 Document Revised: 06/20/2016 Document Reviewed: 06/20/2016 Elsevier Interactive Patient Education  2017 [employer].  Hepatitis B Vaccine, Recombinant injection What is this medicine? HEPATITIS B VACCINE (hep uh TAHY tis B VAK seen) is a vaccine. It is used  to prevent an infection with the hepatitis B virus. This medicine may be used for other purposes; ask your health care provider or pharmacist if you have questions. COMMON BRAND NAME(S): Engerix-B, Recombivax HB What should I tell my health care provider before I take this medicine? They need to know if you have any of these conditions: -fever, infection -heart disease -hepatitis B infection -immune system problems -kidney disease -an unusual or allergic reaction to vaccines, yeast, other medicines, foods, dyes, or preservatives -pregnant or trying to get pregnant -breast-feeding How should I use this medicine? This vaccine is for injection into a muscle. It is given by a health care professional. A copy of Vaccine Information Statements will be given before each vaccination. Read  this sheet carefully each time. The sheet may change frequently. Talk to your pediatrician regarding the use of this medicine in children. While this drug may be prescribed for children as young as newborn for selected conditions, precautions do apply. Overdosage: If you think you have taken too much of this medicine contact a poison control center or emergency room at once. NOTE: This medicine is only for you. Do not share this medicine with others. What if I miss a dose? It is important not to miss your dose. Call your doctor or health care professional if you are unable to keep an appointment. What may interact with this medicine? -medicines that suppress your immune function like adalimumab, anakinra, infliximab -medicines to treat cancer -steroid medicines like prednisone or cortisone This list may not describe all possible interactions. Give your health care provider a list of all the medicines, herbs, non-prescription drugs, or dietary supplements you use. Also tell them if you smoke, drink alcohol, or use illegal drugs. Some items may interact with your medicine. What should I watch for while using this medicine? See your health care provider for all shots of this vaccine as directed. You must have 3 shots of this vaccine for protection from hepatitis B infection. Tell your doctor right away if you have any serious or unusual side effects after getting this vaccine. What side effects may I notice from receiving this medicine? Side effects that you should report to your doctor or health care professional as soon as possible: -allergic reactions like skin rash, itching or hives, swelling of the face, lips, or tongue -breathing problems -confused, irritated -fast, irregular heartbeat -flu-like syndrome -numb, tingling pain -seizures -unusually weak or tired Side effects that usually do not require medical attention (report to your doctor or health care professional if they continue or are  bothersome): -diarrhea -fever -headache -loss of appetite -muscle pain -nausea -pain, redness, swelling, or irritation at site where injected -tiredness This list may not describe all possible side effects. Call your doctor for medical advice about side effects. You may report side effects to FDA at 1-800-FDA-1088. Where should I keep my medicine? This drug is given in a hospital or clinic and will not be stored at home. NOTE: This sheet is a summary. It may not cover all possible information. If you have questions about this medicine, talk to your doctor, pharmacist, or health care provider.  2018 Elsevier/Gold Standard (2014-01-31 13:26:01)

## 2018-02-05 ENCOUNTER — Ambulatory Visit
Admission: RE | Admit: 2018-02-05 | Discharge: 2018-02-05 | Disposition: A | Discharge status: Home / Self Care | Payer: BC Managed Care – PPO | Source: Ambulatory Visit | Attending: Internal Medicine | Admitting: Internal Medicine

## 2018-02-05 ENCOUNTER — Other Ambulatory Visit (INDEPENDENT_AMBULATORY_CARE_PROVIDER_SITE_OTHER): Payer: BC Managed Care – PPO

## 2018-02-05 DIAGNOSIS — O903 Peripartum cardiomyopathy: Secondary | ICD-10-CM

## 2018-02-05 DIAGNOSIS — I509 Heart failure, unspecified: Secondary | ICD-10-CM | POA: Insufficient documentation

## 2018-02-05 DIAGNOSIS — I429 Cardiomyopathy, unspecified: Secondary | ICD-10-CM | POA: Diagnosis not present

## 2018-02-05 LAB — LIPID PANEL
CHOL/HDL RATIO: 3
Cholesterol: 165 mg/dL (ref 0–200)
HDL: 63.6 mg/dL (ref >39.00)
LDL CALC: 91 mg/dL (ref 0–99)
NONHDL: 101.81
Triglycerides: 56 mg/dL (ref 0.0–149.0)
VLDL: 11.2 mg/dL (ref 0.0–40.0)

## 2018-02-05 NOTE — Progress Notes (Signed)
*  PRELIMINARY RESULTS* Echocardiogram 2D Echocardiogram has been performed.  Sherrie Sport 02/05/2018, 10:20 AM

## 2018-03-03 ENCOUNTER — Ambulatory Visit: Payer: BC Managed Care – PPO

## 2018-03-12 ENCOUNTER — Ambulatory Visit (INDEPENDENT_AMBULATORY_CARE_PROVIDER_SITE_OTHER): Payer: BC Managed Care – PPO | Admitting: *Deleted

## 2018-03-12 DIAGNOSIS — Z23 Encounter for immunization: Secondary | ICD-10-CM | POA: Diagnosis not present

## 2018-03-12 DIAGNOSIS — E559 Vitamin D deficiency, unspecified: Secondary | ICD-10-CM

## 2018-03-12 NOTE — Progress Notes (Signed)
Patient tolerated Hep B vaccine well.

## 2018-04-29 ENCOUNTER — Encounter: Payer: Self-pay | Admitting: Internal Medicine

## 2018-06-24 ENCOUNTER — Other Ambulatory Visit: Payer: Self-pay | Admitting: Internal Medicine

## 2018-06-24 DIAGNOSIS — E559 Vitamin D deficiency, unspecified: Secondary | ICD-10-CM

## 2018-06-24 MED ORDER — CHOLECALCIFEROL 1.25 MG (50000 UT) PO CAPS: 50000.0000 [IU] | ORAL_CAPSULE | ORAL | 1 refills | Status: DC

## 2018-07-14 ENCOUNTER — Encounter: Payer: Self-pay | Admitting: Internal Medicine

## 2018-07-14 ENCOUNTER — Ambulatory Visit: Payer: BC Managed Care – PPO | Admitting: Internal Medicine

## 2018-07-14 VITALS — BP 152/90 | HR 61 | Temp 98.7°F | Resp 18 | Ht 66.0 in | Wt 269.4 lb

## 2018-07-14 DIAGNOSIS — I10 Essential (primary) hypertension: Secondary | ICD-10-CM | POA: Diagnosis not present

## 2018-07-14 DIAGNOSIS — Z1231 Encounter for screening mammogram for malignant neoplasm of breast: Secondary | ICD-10-CM | POA: Diagnosis not present

## 2018-07-14 DIAGNOSIS — H6192 Disorder of left external ear, unspecified: Secondary | ICD-10-CM

## 2018-07-14 DIAGNOSIS — H9392 Unspecified disorder of left ear: Secondary | ICD-10-CM

## 2018-07-14 DIAGNOSIS — J069 Acute upper respiratory infection, unspecified: Secondary | ICD-10-CM

## 2018-07-14 MED ORDER — ALBUTEROL SULFATE (2.5 MG/3ML) 0.083% IN NEBU
2.5000 mg | INHALATION_SOLUTION | Freq: Once | RESPIRATORY_TRACT | Status: AC
  Administered 2018-07-14: 2.5 mg via RESPIRATORY_TRACT

## 2018-07-14 MED ORDER — AZITHROMYCIN 250 MG PO TABS: ORAL_TABLET | ORAL | 0 refills | Status: DC

## 2018-07-14 MED ORDER — IPRATROPIUM BROMIDE 0.02 % IN SOLN
0.5000 mg | Freq: Once | RESPIRATORY_TRACT | Status: AC
  Administered 2018-07-14: 0.5 mg via RESPIRATORY_TRACT

## 2018-07-14 NOTE — Patient Instructions (Addendum)
Call back in 1 week if not better  Get flu shot when better    Upper Respiratory Infection, Adult Most upper respiratory infections (URIs) are caused by a virus. A URI affects the nose, throat, and upper air passages. The most common type of URI is often called "the common cold." Follow these instructions at home:  Take medicines only as told by your doctor.  Gargle warm saltwater or take cough drops to comfort your throat as told by your doctor.  Use a warm mist humidifier or inhale steam from a shower to increase air moisture. This may make it easier to breathe.  Drink enough fluid to keep your pee (urine) clear or pale yellow.  Eat soups and other clear broths.  Have a healthy diet.  Rest as needed.  Go back to work when your fever is gone or your doctor says it is okay. ? You may need to stay home longer to avoid giving your URI to others. ? You can also wear a face mask and wash your hands often to prevent spread of the virus.  Use your inhaler more if you have asthma.  Do not use any tobacco products, including cigarettes, chewing tobacco, or electronic cigarettes. If you need help quitting, ask your doctor. Contact a doctor if:  You are getting worse, not better.  Your symptoms are not helped by medicine.  You have chills.  You are getting more short of breath.  You have brown or red mucus.  You have yellow or brown discharge from your nose.  You have pain in your face, especially when you bend forward.  You have a fever.  You have puffy (swollen) neck glands.  You have pain while swallowing.  You have white areas in the back of your throat. Get help right away if:  You have very bad or constant: ? Headache. ? Ear pain. ? Pain in your forehead, behind your eyes, and over your cheekbones (sinus pain). ? Chest pain.  You have long-lasting (chronic) lung disease and any of the following: ? Wheezing. ? Long-lasting cough. ? Coughing up blood. ? A  change in your usual mucus.  You have a stiff neck.  You have changes in your: ? Vision. ? Hearing. ? Thinking. ? Mood. This information is not intended to replace advice given to you by your health care provider. Make sure you discuss any questions you have with your health care provider. Document Released: 03/18/2008 Document Revised: 06/02/2016 Document Reviewed: 01/05/2014 Elsevier Interactive Patient Education  2018 [employer].

## 2018-07-14 NOTE — Progress Notes (Signed)
Chief Complaint  Patient presents with  . Cough    productive green colored mucous since Thursday, no fever or chills.   Sick visit  1. Cough Sunday sick coworker had bronchitis 1 week ago. C/o cough congestion w/o fever green mucus w/o sinus pressure no sore throat of body aches. Tried otc alkaseltzer plus w/o relief 2. HTN uncontrolled no meds today    Review of Systems  Constitutional: Negative for fever.  HENT: Negative for sinus pain and sore throat.   Eyes: Negative for blurred vision.  Respiratory: Positive for cough, sputum production and wheezing.   Cardiovascular: Negative for chest pain.  Skin: Negative for rash.  Neurological: Negative for headaches.  Psychiatric/Behavioral: Negative for depression.   Past Medical History:  Diagnosis Date  . Allergic rhinitis, seasonal   . History of CHF (congestive heart failure)   . Hordeolum externum of left eye   . HSV-2 infection   . HTN, goal below 140/90   . Left ear pain   . Obesity, Class III, BMI 40-49.9 (morbid obesity) ([place])    Past Surgical History:  Procedure Laterality Date  . BREAST BIOPSY Right 1994   Negative  . BREAST LUMPECTOMY     benign (at age 29yr)  . CESAREAN SECTION     x's 2  . COLONOSCOPY  12/2008   Dr. Kenton Kingfisher, follow-up ASCUS   Family History  Problem Relation Age of Onset  . Cancer Mother        breast dx'ed in 5s   . Breast cancer Mother   . Hypertension Father   . Diabetes Father   . Cancer Father        colon cancer   . Cancer Maternal Grandmother        breast dx later in life   . Breast cancer Maternal Grandmother   . Breast cancer Paternal Grandmother   . Cancer Paternal Grandmother        breast cancer dx'ed later in life    Social History   Socioeconomic History  . Marital status: Unknown    Spouse name: Not on file  . Number of children: Not on file  . Years of education: Not on file  . Highest education level: Not on file  Occupational History  . Not on file  Social  Needs  . Financial resource strain: Not on file  . Food insecurity:    Worry: Not on file    Inability: Not on file  . Transportation needs:    Medical: Not on file    Non-medical: Not on file  Tobacco Use  . Smoking status: Former Research scientist (life sciences)  . Smokeless tobacco: Never Used  . Tobacco comment: quit in 2010. Used to smoke 1/2 PPD.  Substance and Sexual Activity  . Alcohol use: Yes    Alcohol/week: 0.0 standard drinks    Comment: occasional  . Drug use: No  . Sexual activity: Yes    Partners: Male    Birth control/protection: Condom  Lifestyle  . Physical activity:    Days per week: Not on file    Minutes per session: Not on file  . Stress: Not on file  Relationships  . Social connections:    Talks on phone: Not on file    Gets together: Not on file    Attends religious service: Not on file    Active member of club or organization: Not on file    Attends meetings of clubs or organizations: Not on file  Relationship status: Not on file  . Intimate partner violence:    Fear of current or ex partner: Not on file    Emotionally abused: Not on file    Physically abused: Not on file    Forced sexual activity: Not on file  Other Topics Concern  . 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 aspirin EC 81 MG tablet Take by mouth.  . carvedilol (COREG) 12.5 MG tablet Take 12.5 mg by mouth daily.  . Cholecalciferol 50000 units capsule Take 1 capsule (50,000 Units total) by mouth once a week.  . fluticasone (FLONASE) 50 MCG/ACT nasal spray Place 2 sprays into both nostrils daily.  . furosemide (LASIX) 20 MG tablet Take 20 mg by mouth daily.  Marland Kitchen lisinopril (PRINIVIL,ZESTRIL) 40 MG tablet Take 1 tablet (40 mg total) by mouth daily.  Marland Kitchen loratadine (CLARITIN) 10 MG tablet Take 1 tablet (10 mg total) by mouth daily. (Patient taking differently: Take 10 mg by mouth daily as needed. )  . Probiotic Product (PROBIOTIC-10) CAPS  Take by mouth.   Allergies  Allergen Reactions  . Penicillin V Rash   No results found for this or any previous visit (from the past 2160 hour(s)). Objective  Body mass index is 43.48 kg/m. Wt Readings from Last 3 Encounters:  07/14/18 269 lb 6 oz (122.2 kg)  01/29/18 273 lb 9.6 oz (124.1 kg)  12/26/17 269 lb 2 oz (122.1 kg)   Temp Readings from Last 3 Encounters:  07/14/18 98.7 F (37.1 C) (Oral)  01/29/18 97.8 F (36.6 C) (Oral)  12/26/17 98.5 F (36.9 C) (Oral)   BP Readings from Last 3 Encounters:  07/14/18 (!) 152/90  01/29/18 128/80  12/26/17 (!) 130/94   Pulse Readings from Last 3 Encounters:  07/14/18 61  01/29/18 77  12/26/17 74    Physical Exam  Constitutional: She is oriented to person, place, and time. Vital signs are normal. She appears well-developed and well-nourished. She is cooperative.  HENT:  Head: Normocephalic and atraumatic.  Mouth/Throat: Oropharynx is clear and moist and mucous membranes are normal.  Eyes: Pupils are equal, round, and reactive to light. Conjunctivae are normal.  Cardiovascular: Normal rate, regular rhythm and normal heart sounds.  Pulmonary/Chest: Effort normal. She has wheezes.  Neurological: She is alert and oriented to person, place, and time. Gait normal.  Skin: Skin is warm, dry and intact.  Psychiatric: She has a normal mood and affect. Her speech is normal and behavior is normal. Judgment and thought content normal. Cognition and memory are normal.  Nursing note and vitals reviewed.   Assessment   1. URI with bronchospasm no h/o asthma 2. HTN no meds today  3. HM Plan   1. Zpack, supportive care cold handout  duoneb today  2. rec take meds  3. rec flu shot when better  Tdap and hep B 3rd due 07/31/18   [facility] for pap will get records [facility] last pap 2018 h/o trich noted reviewed with pt. Has IUD x 4 years and due for new one 10/2018  -pap neg 08/27/16 neg pap neg HPV mammo 09/24/17 neg insurance would  not cover genetic testing for breast cancer as mom and both grandmothers dx'ed breast cancer at older age per pt  -referred today Dad h/o colon cancer ask pt at f/u age he was dx'ed as she will need colonoscopy 10 years prior to his  dx dad dx'ed 30s start normal screening  -refer dermatology 2 lesions behind left ear   Provider: Dr. Olivia Mackie McLean-Scocuzza-Internal Medicine

## 2018-07-31 ENCOUNTER — Ambulatory Visit: Payer: BC Managed Care – PPO | Admitting: Internal Medicine

## 2018-07-31 ENCOUNTER — Encounter: Payer: Self-pay | Admitting: Internal Medicine

## 2018-07-31 VITALS — BP 154/88 | HR 73 | Temp 98.1°F | Ht 66.0 in | Wt 273.0 lb

## 2018-07-31 DIAGNOSIS — Z23 Encounter for immunization: Secondary | ICD-10-CM | POA: Diagnosis not present

## 2018-07-31 DIAGNOSIS — Z1389 Encounter for screening for other disorder: Secondary | ICD-10-CM

## 2018-07-31 DIAGNOSIS — I10 Essential (primary) hypertension: Secondary | ICD-10-CM

## 2018-07-31 DIAGNOSIS — Z0184 Encounter for antibody response examination: Secondary | ICD-10-CM

## 2018-07-31 DIAGNOSIS — I5020 Unspecified systolic (congestive) heart failure: Secondary | ICD-10-CM | POA: Diagnosis not present

## 2018-07-31 DIAGNOSIS — Z1159 Encounter for screening for other viral diseases: Secondary | ICD-10-CM

## 2018-07-31 DIAGNOSIS — Z1329 Encounter for screening for other suspected endocrine disorder: Secondary | ICD-10-CM | POA: Diagnosis not present

## 2018-07-31 DIAGNOSIS — E559 Vitamin D deficiency, unspecified: Secondary | ICD-10-CM

## 2018-07-31 NOTE — Patient Instructions (Addendum)
D3 5000 IU daily  sch mammo 09/24/18  sch fasting labs 12/27/2018  Write down blood pressure and let me know if >140/>90 or <90/<60 too high and too low   Vitamin D Deficiency Vitamin D deficiency is when your body does not have enough vitamin D. Vitamin D is important to your body for many reasons:  It helps the body to absorb two important minerals, called calcium and phosphorus.  It plays a role in bone health.  It may help to prevent some diseases, such as diabetes and multiple sclerosis.  It plays a role in muscle function, including heart function.  You can get vitamin D by:  Eating foods that naturally contain vitamin D.  Eating or drinking milk or other dairy products that have vitamin D added to them.  Taking a vitamin D supplement or a multivitamin supplement that contains vitamin D.  Being in the sun. Your body naturally makes vitamin D when your skin is exposed to sunlight. Your body changes the sunlight into a form of the vitamin that the body can use.  If vitamin D deficiency is severe, it can cause a condition in which your bones become soft. In adults, this condition is called osteomalacia. In children, this condition is called rickets. What are the causes? Vitamin D deficiency may be caused by:  Not eating enough foods that contain vitamin D.  Not getting enough sun exposure.  Having certain digestive system diseases that make it difficult for your body to absorb vitamin D. These diseases include Crohn disease, chronic pancreatitis, and cystic fibrosis.  Having a surgery in which a part of the stomach or a part of the small intestine is removed.  Being obese.  Having chronic kidney disease or liver disease.  What increases the risk? This condition is more likely to develop in:  Older people.  People who do not spend much time outdoors.  People who live in a long-term care facility.  People who have had broken bones.  People with weak or thin bones  (osteoporosis).  People who have a disease or condition that changes how the body absorbs vitamin D.  People who have dark skin.  People who take certain medicines, such as steroid medicines or certain seizure medicines.  People who are overweight or obese.  What are the signs or symptoms? In mild cases of vitamin D deficiency, there may not be any symptoms. If the condition is severe, symptoms may include:  Bone pain.  Muscle pain.  Falling often.  Broken bones caused by a minor injury.  How is this diagnosed? This condition is usually diagnosed with a blood test. How is this treated? Treatment for this condition may depend on what caused the condition. Treatment options include:  Taking vitamin D supplements.  Taking a calcium supplement. Your health care provider will suggest what dose is best for you.  Follow these instructions at home:  Take medicines and supplements only as told by your health care provider.  Eat foods that contain vitamin D. Choices include: ? Fortified dairy products, cereals, or juices. Fortified means that vitamin D has been added to the food. Check the label on the package to be sure. ? Fatty fish, such as salmon or trout. ? Eggs. ? Oysters.  Do not use a tanning bed.  Maintain a healthy weight. Lose weight, if needed.  Keep all follow-up visits as told by your health care provider. This is important. Contact a health care provider if:  Your symptoms  do not go away.  You feel like throwing up (nausea) or you throw up (vomit).  You have fewer bowel movements than usual or it is difficult for you to have a bowel movement (constipation). This information is not intended to replace advice given to you by your health care provider. Make sure you discuss any questions you have with your health care provider. Document Released: 12/23/2011 Document Revised: 03/13/2016 Document Reviewed: 02/15/2015 Elsevier Interactive Patient Education  2018  [employer].

## 2018-07-31 NOTE — Progress Notes (Signed)
No chief complaint on file.  F/u  1. HTN on coreg 12.5 mg bid, lasix 20 mg qd, lisinopril 40 mg qd 2. Hep B and flu shot today  3. Cardiomyopathy stable and improved saw cards and f/u in 1 year Dr. Clayborn Bigness   Review of Systems  Constitutional: Negative for weight loss.  HENT: Negative for hearing loss.   Eyes: Negative for blurred vision.  Respiratory: Negative for shortness of breath.   Cardiovascular: Negative for chest pain.  Gastrointestinal: Negative for abdominal pain.  Skin: Negative for rash.  Neurological: Negative for headaches.  Psychiatric/Behavioral: Negative for depression.   Past Medical History:  Diagnosis Date  . Allergic rhinitis, seasonal   . History of CHF (congestive heart failure)   . Hordeolum externum of left eye   . HSV-2 infection   . HTN, goal below 140/90   . Left ear pain   . Obesity, Class III, BMI 40-49.9 (morbid obesity) ([place])    Past Surgical History:  Procedure Laterality Date  . BREAST BIOPSY Right 1994   Negative  . BREAST LUMPECTOMY     benign (at age 85yr)  . CESAREAN SECTION     x's 2  . COLONOSCOPY  12/2008   Dr. Kenton Kingfisher, follow-up ASCUS   Family History  Problem Relation Age of Onset  . Cancer Mother        breast dx'ed in 70s   . Breast cancer Mother   . Hypertension Father   . Diabetes Father   . Cancer Father        colon cancer   . Cancer Maternal Grandmother        breast dx later in life   . Breast cancer Maternal Grandmother   . Breast cancer Paternal Grandmother   . Cancer Paternal Grandmother        breast cancer dx'ed later in life    Social History   Socioeconomic History  . Marital status: Unknown    Spouse name: Not on file  . Number of children: Not on file  . Years of education: Not on file  . Highest education level: Not on file  Occupational History  . Not on file  Social Needs  . Financial resource strain: Not on file  . Food insecurity:    Worry: Not on file    Inability: Not on file  .  Transportation needs:    Medical: Not on file    Non-medical: Not on file  Tobacco Use  . Smoking status: Former Research scientist (life sciences)  . Smokeless tobacco: Never Used  . Tobacco comment: quit in 2010. Used to smoke 1/2 PPD.  Substance and Sexual Activity  . Alcohol use: Yes    Alcohol/week: 0.0 standard drinks    Comment: occasional  . Drug use: No  . Sexual activity: Yes    Partners: Male    Birth control/protection: Condom  Lifestyle  . Physical activity:    Days per week: Not on file    Minutes per session: Not on file  . Stress: Not on file  Relationships  . Social connections:    Talks on phone: Not on file    Gets together: Not on file    Attends religious service: Not on file    Active member of club or organization: Not on file    Attends meetings of clubs or organizations: Not on file    Relationship status: Not on file  . Intimate partner violence:    Fear of current or ex  partner: Not on file    Emotionally abused: Not on file    Physically abused: Not on file    Forced sexual activity: Not on file  Other Topics Concern  . Not on file  Social History Narrative   Works [employer], former CMA training    No outpatient medications have been marked as taking for the 07/31/18 encounter (Appointment) with McLean-Scocuzza, Nino Glow, MD.   Allergies  Allergen Reactions  . Penicillin V Rash   No results found for this or any previous visit (from the past 2160 hour(s)). Objective  There is no height or weight on file to calculate BMI. Wt Readings from Last 3 Encounters:  07/14/18 269 lb 6 oz (122.2 kg)  01/29/18 273 lb 9.6 oz (124.1 kg)  12/26/17 269 lb 2 oz (122.1 kg)   Temp Readings from Last 3 Encounters:  07/14/18 98.7 F (37.1 C) (Oral)  01/29/18 97.8 F (36.6 C) (Oral)  12/26/17 98.5 F (36.9 C) (Oral)   BP Readings from Last 3 Encounters:  07/14/18 (!) 152/90  01/29/18 128/80  12/26/17 (!) 130/94   Pulse Readings from Last 3 Encounters:  07/14/18 61  01/29/18 77   12/26/17 74    Physical Exam  Constitutional: She is oriented to person, place, and time. Vital signs are normal. She appears well-developed and well-nourished. She is cooperative.  HENT:  Head: Normocephalic and atraumatic.  Mouth/Throat: Oropharynx is clear and moist and mucous membranes are normal.  Eyes: Pupils are equal, round, and reactive to light. Conjunctivae are normal.  Cardiovascular: Normal rate, regular rhythm and normal heart sounds.  Pulmonary/Chest: Effort normal and breath sounds normal.  Neurological: She is alert and oriented to person, place, and time. Gait normal.  Skin: Skin is warm, dry and intact.  Psychiatric: She has a normal mood and affect. Her speech is normal and behavior is normal. Judgment and thought content normal. Cognition and memory are normal.  Nursing note and vitals reviewed.   Assessment   1. HTN elevated no meds today  2. Cardiomyopathy stable  3. HM Plan   1. Cont meds  2. Monitor f/u cards in 1 year  3.  Flu shot today  Tdap and hep B 3rd today  [facility] for pap will get records [facility] last pap 2018 h/o trich noted reviewed with pt. Has IUD x 4 years and due for new one 10/2018  -pap neg 08/27/16 neg pap neg HPV mammo 09/24/17 neg insurance would not cover genetic testing for breast cancer as mom and both grandmothers dx'ed breast cancer at older age per pt  -referred today Dad h/o colon cancer ask pt at f/u age he was dx'ed as she will need colonoscopy 10 years prior to his dx dad dx'ed 42s start normal screening  -refer dermatology 2 lesions behind left ear appt 10/2018    Provider: Dr. Olivia Mackie McLean-Scocuzza-Internal Medicine

## 2018-09-29 ENCOUNTER — Ambulatory Visit
Admission: RE | Admit: 2018-09-29 | Discharge: 2018-09-29 | Disposition: A | Discharge status: Home / Self Care | Payer: BC Managed Care – PPO | Source: Ambulatory Visit | Attending: Internal Medicine | Admitting: Internal Medicine

## 2018-09-29 DIAGNOSIS — Z1231 Encounter for screening mammogram for malignant neoplasm of breast: Secondary | ICD-10-CM | POA: Diagnosis present

## 2018-10-15 ENCOUNTER — Ambulatory Visit: Payer: BC Managed Care – PPO | Admitting: Family Medicine

## 2018-10-15 ENCOUNTER — Encounter: Payer: Self-pay | Admitting: Family Medicine

## 2018-10-15 VITALS — BP 142/88 | HR 85 | Temp 98.5°F | Ht 66.0 in | Wt 273.4 lb

## 2018-10-15 DIAGNOSIS — L0291 Cutaneous abscess, unspecified: Secondary | ICD-10-CM | POA: Diagnosis not present

## 2018-10-15 MED ORDER — CLINDAMYCIN HCL 300 MG PO CAPS: 300.0000 mg | ORAL_CAPSULE | Freq: Three times a day (TID) | ORAL | 0 refills | Status: DC

## 2018-10-15 MED ORDER — CIPROFLOXACIN HCL 500 MG PO TABS: 500.0000 mg | ORAL_TABLET | Freq: Two times a day (BID) | ORAL | 0 refills | Status: DC

## 2018-10-15 NOTE — Progress Notes (Signed)
Subjective:    Patient ID: Heather Case, female    DOB: 11-21-1970, 48 y.o.   MRN: [id number]  HPI  Presents to clinic with suspected abscess on back that began as a small dot and now is is larger than a silver dollar;  the area has been leaking.  Patient denies any fever or chills.  States she has never had an area like this before.  States the area is not painful, but it seems to be draining more so today than yesterday.  Patient Active Problem List   Diagnosis Date Noted  . Vitamin D deficiency 01/29/2018  . Medication monitoring encounter 01/13/2017  . Hx of iron deficiency anemia 01/13/2017  . Left knee pain 12/12/2015  . Atypical squamous cell changes of cervix undetermined significance favor benign 06/13/2015  . Allergic rhinitis, seasonal 06/13/2015  . History of prolonged Q-T interval on ECG 06/13/2015  . Dietary counseling and surveillance 06/13/2015  . CHF NYHA class I (no symptoms from ordinary activities) ([place]) 06/13/2015  . Morbid obesity ([place]) 06/13/2015  . 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 Hypertension goal BP (blood pressure) < 140/90 01/10/2009   Social History   Tobacco Use  . Smoking status: Former Research scientist (life sciences)  . Smokeless tobacco: Never Used  . Tobacco comment: quit in 2010. Used to smoke 1/2 PPD.  Substance Use Topics  . Alcohol use: Yes    Alcohol/week: 0.0 standard drinks    Comment: occasional   Review of Systems  Constitutional: Negative for chills, fatigue and fever.  HENT: Negative for congestion, ear pain, sinus pain and sore throat.   Eyes: Negative.   Respiratory: Negative for cough, shortness of breath and wheezing.   Cardiovascular: Negative for chest pain, palpitations and leg swelling.  Gastrointestinal: Negative for abdominal pain, diarrhea, nausea and vomiting.  Genitourinary: Negative for dysuria, frequency and urgency.  Musculoskeletal: Negative for arthralgias and myalgias.  Skin: +abscess on back Neurological:  Negative for syncope, light-headedness and headaches.  Psychiatric/Behavioral: The patient is not nervous/anxious.       Objective:   Physical Exam Vitals signs and nursing note reviewed.  Constitutional:      General: She is not in acute distress.    Appearance: She is not ill-appearing, toxic-appearing or diaphoretic.  HENT:     Head: Normocephalic and atraumatic.  Eyes:     General: No scleral icterus.    Extraocular Movements: Extraocular movements intact.     Conjunctiva/sclera: Conjunctivae normal.  Neck:     Musculoskeletal: Normal range of motion. No neck rigidity.  Cardiovascular:     Rate and Rhythm: Normal rate and regular rhythm.  Pulmonary:     Effort: Pulmonary effort is normal. No respiratory distress.     Breath sounds: Normal breath sounds.  Skin:    General: Skin is warm and dry.          Comments: Abscess area on left upper back indicated by red mark on diagram.  Open portion of abscess is approximately size of a silver dollar and surrounding hard area is approximately size of an index card.  Surrounding skin is not warm or hot to touch; it is not red either.  When pressed on area, yellow discharge is expressed.  Area cleansed with hydrogen peroxide, patted dry and nonadherent dressing placed and secured with paper tape.  Wound culture was collected and sent to lab.  Neurological:     Mental Status: She is alert and oriented to person, place, and time.  Gait: Gait normal.  Psychiatric:        Mood and Affect: Mood normal.        Behavior: Behavior normal.       Vitals:   10/15/18 1530  BP: (!) 142/88  Pulse: 85  Temp: 98.5 F (36.9 C)  SpO2: 93%   Assessment & Plan:   Abscess on back - due to size of abscess I believe patient most likely will need to be debrided.  I wanted patient to see a general surgery on Friday 10/16/2018, but she states she is unavailable.  She will begin 2 antibiotics, clindamycin and ciprofloxacin to cover bacterial infection.   She has been advised to monitor area and if any redness, increase in size, fever, generalized body aches began to develop she has been advised to go to emergency room right away.  Patient states she is available on Monday, January 6 to see general surgery, so our referral coordinators made aware and will work on getting this appointment set up for patient.  Patient verbalized understanding of return precautions and or alarm symptoms that would indicate she needs to go to emergency room.

## 2018-10-16 ENCOUNTER — Encounter: Payer: Self-pay | Admitting: Family Medicine

## 2018-10-18 LAB — WOUND CULTURE
MICRO NUMBER: 3182
SPECIMEN QUALITY: ADEQUATE

## 2018-10-19 ENCOUNTER — Ambulatory Visit: Payer: BC Managed Care – PPO | Admitting: Surgery

## 2018-10-19 ENCOUNTER — Encounter: Payer: Self-pay | Admitting: Surgery

## 2018-10-19 ENCOUNTER — Other Ambulatory Visit: Payer: Self-pay

## 2018-10-19 VITALS — BP 151/93 | HR 78 | Temp 97.7°F | Ht 66.0 in | Wt 276.8 lb

## 2018-10-19 DIAGNOSIS — L02212 Cutaneous abscess of back [any part, except buttock]: Secondary | ICD-10-CM

## 2018-10-19 NOTE — Patient Instructions (Addendum)
Patient is to return to the office in 1 week with Dr.pabon for back abscess.  Call the office with any questions or concerns.  Wound Packing Wound packing usually involves placing a moistened packing material into your wound and then covering it with an outer bandage (dressing). This helps promote proper healing of deep tissue and tissue under the skin. It also helps prevent bleeding, infection, and further injury. Wounds are packed until deep tissues heal. The time it takes for this to occur is different for everyone. Your health care provider will show you how to pack and dress your wound. Using gloves and a clean technique is important in order to avoid spreading germs into your wound. Supplies needed:  Soap and water.  Disposable gloves.  Wetting solution.  Clean bowl.  Clean packing material (gauze or gauze sponges).  Clean paper towels.  Outer dressing.  Tape.  Cotton-tipped swabs.  Small plastic bag. How to pack your wound Follow your health care provider's instructions on how often you need to change dressings and pack your wound. You will likely be asked to change dressings 1-2 times a day. Preparing the new packing material  1. Clean and disinfect your work surface or countertop. 2. Set a plastic bag on or near your work surface. 3. Wash your hands well with soap and water. 4. Put a clean paper towel on the counter. 5. Put a clean bowl on the towel. Be sure to only touch the outside of the bowl when handling it. 6. Pour wetting solution into the bowl. 7. Cut your packing material (gauze or sponges) to the right size for your wound. Drop it into the bowl. 8. Cut 4 tape strips that you will use to seal the outer dressing. 9. Put cotton-tipped swabs on the clean paper towel. Removing the old packing material and dressing 1. Put on a set of gloves. 2. Gently remove the old dressing and packing material. 3. Remove your gloves. 4. Put the removed items, including the  gloves, into the plastic bag to throw away later. 5. Wash your hands well with soap and water again. Applying the new packing material and dressing 1. Put on a new set of gloves. 2. Squeeze the packing material in the bowl to release the extra liquid. The packing material should be moist, but not dripping wet. 3. Gently place the packing material into the wound. Use a cotton-tipped swab to guide it into place, filling all of the space. 4. Dry your gloved fingertips on the paper towel. 5. Open up your outer dressing supplies and put them on a dry part of the paper towel. Keep them from getting wet. 6. Place the outer dressing over the packed wound. 7. Tape the 4 outer edges of the outer dressing in place. 8. Remove your gloves. 9. Wash your hands again with soap and water. 10. Clean and disinfect your work surface or countertop. General tips  Follow your health care provider's instructions on how much to pack the wound. At first, you may need to pack it more tightly to help stop bleeding. As the wound begins to heal inside, you will use less packing material and pack the wound loosely to allow tissue to heal slowly from the inside out.  Keep the dressing clean and dry.  Follow any other instructions given by your health care provider on how to aid healing. This may include applying warm or cold compresses, raising (elevating) the affected area, or wearing a compression dressing.  Check  your wound site every day for signs of infection. Check for: ? More redness, swelling, or pain. ? More fluid or blood. ? Warmth. ? Pus or a bad smell.  Ask your health care provider about avoiding sun exposure and using sunscreen when the dressings are no longer needed.  Keep all follow-up visits as told by your health care provider. This is important. Contact a health care provider if:  You have more drainage, redness, swelling, or pain at your wound site.  You notice a bad smell coming from the wound  site.  Your wound site feels warm to the touch.  Your wound becomes larger or deeper.  Your wound changes in size or depth. Get help right away if:  Your pain is not controlled with pain medicine.  The tissue inside your wound changes color from pink to white, yellow, or black.  You have a fever.  You have shaking chills.  You are having trouble packing your wound. Summary  Wound packing usually involves placing a moistened packing material into your wound and then covering it with an outer bandage (dressing).  Follow your health care provider's instructions on how often you need to change dressings and pack your wound. You will likely be asked to change dressings 1-2 times a day.  When packing your wound, it is important to use gloves and a clean technique in order to avoid spreading germs into the wound.  Check your wound site every day for signs of infection. This information is not intended to replace advice given to you by your health care provider. Make sure you discuss any questions you have with your health care provider. Document Released: 04/27/2014 Document Revised: 11/06/2017 Document Reviewed: 11/06/2017 Elsevier Interactive Patient Education  2019 [employer].

## 2018-10-19 NOTE — Progress Notes (Signed)
Patient ID: LOYAL RUDY, female   DOB: Aug 25, 1971, 48 y.o.   MRN: [id number]  HPI Heather Case is a 48 y.o. female seen in consultation at the request of [name] NP. She reports having abscess that has spontaneously drained for the last few days. She went to her PCP and was placed on A/bs now feels better but has persistent drainage. No fevers or chills, no diabetes.she experiences intermittent moderate sharp pain. She does have hx CHF after pregnancy. She is very functional and is able to perform more than 4 METS. She does have some dyspnea on exertion  HPI  Past Medical History:  Diagnosis Date  . Allergic rhinitis, seasonal   . History of CHF (congestive heart failure)   . Hordeolum externum of left eye   . HSV-2 infection   . HTN, goal below 140/90   . Left ear pain   . Obesity, Class III, BMI 40-49.9 (morbid obesity) ([place])   . Vitamin D deficiency     Past Surgical History:  Procedure Laterality Date  . BREAST BIOPSY Right 1994   Negative  . BREAST LUMPECTOMY     benign (at age 76yr)  . CESAREAN SECTION     x's 2  . COLONOSCOPY  12/2008   Dr. Kenton Case, follow-up ASCUS    Family History  Problem Relation Age of Onset  . Cancer Mother        breast dx'ed in 19s   . Breast cancer Mother 54  . Hypertension Father   . Diabetes Father   . Cancer Father        colon cancer   . Cancer Maternal Grandmother        breast dx later in life   . Breast cancer Maternal Grandmother   . Breast cancer Paternal Grandmother   . Cancer Paternal Grandmother        breast cancer dx'ed later in life     Social History Social History   Tobacco Use  . Smoking status: Former Research scientist (life sciences)  . Smokeless tobacco: Never Used  . Tobacco comment: quit in 2010. Used to smoke 1/2 PPD.  Substance Use Topics  . Alcohol use: Yes    Alcohol/week: 0.0 standard drinks    Comment: occasional  . Drug use: No    Allergies  Allergen Reactions  . Penicillin V Rash    Current Outpatient  Medications  Medication Sig Dispense Refill  . aspirin EC 81 MG tablet Take by mouth.    . carvedilol (COREG) 12.5 MG tablet Take 12.5 mg by mouth daily.  11  . Cholecalciferol 50000 units capsule Take 1 capsule (50,000 Units total) by mouth once a week. 13 capsule 1  . ciprofloxacin (CIPRO) 500 MG tablet Take 1 tablet (500 mg total) by mouth 2 (two) times daily. 20 tablet 0  . clindamycin (CLEOCIN) 300 MG capsule Take 1 capsule (300 mg total) by mouth 3 (three) times daily. 30 capsule 0  . fluticasone (FLONASE) 50 MCG/ACT nasal spray Place 2 sprays into both nostrils daily. 16 g 11  . furosemide (LASIX) 20 MG tablet Take 20 mg by mouth daily.  11  . lisinopril (PRINIVIL,ZESTRIL) 40 MG tablet Take 1 tablet (40 mg total) by mouth daily. 90 tablet 2  . loratadine (CLARITIN) 10 MG tablet Take 1 tablet (10 mg total) by mouth daily. (Patient taking differently: Take 10 mg by mouth daily as needed. ) 90 tablet 1  . Probiotic Product (PROBIOTIC-10) CAPS Take by mouth.  No current facility-administered medications for this visit.      Review of Systems Full ROS  was asked and was negative except for the information on the HPI  Physical Exam Blood pressure (!) 151/93, pulse 78, temperature 97.7 F (36.5 C), temperature source Temporal, height 5\' 6"  (1.676 m), weight 276 lb 12.8 oz (125.6 kg), SpO2 98 %. CONSTITUTIONAL: NAD. EYES: Pupils are equal, round, and , Sclera are non-icteric. LYMPH NODES:  Lymph nodes in the neck are normal. RESPIRATORY:  Lungs are clear. There is normal respiratory effort, with equal breath sounds bilaterally, and without pathologic use of accessory muscles. CARDIOVASCULAR: Heart is regular without murmurs, gallops, or rubs. GI: The abdomen is  soft, nontender, and nondistended. There are no palpable masses. There is no hepatosplenomegaly. There are normal bowel sounds in all quadrants. GU: Rectal deferred.   MUSCULOSKELETAL: Normal muscle strength and tone. No  cyanosis or edema.   SKIN: necrotic abscess back, some purulent drainage. Some erythema and tenderness. NEUROLOGIC: Motor and sensation is grossly normal. Cranial nerves are grossly intact. PSYCH:  Oriented to person, place and time. Affect is normal.  Data Reviewed  I have personally reviewed the patient's imaging, laboratory findings and medical records.    Assessment/Plan 48 yo female with complex abscess and necrotic tissue in need for debridement. D/W the pt in detail about the procedure, risks, benefits and possible complications. Including but not limited to prolonged wound healing , infection and pain. She understands and wishes to proceed. A copy of this report was sent to referring provider.     Procedure Note  1. Incision drainage complex back abscess 2. Excisional  Debridement of sub q and muscle measuring 4.5 x 2 cm = 9cm2  Anesthesia: lidocaine 1% w epi  Complications: none  Findings: necrotic and complex abscess back  Pt was prepped and draped in the standard fashion. Local infiltrated. Using 15 blade the abscess was drained with some cloudy fluid. Excisional debridement performed w 15 blade knife excising necrotic skin, sub q and fascia. 9 cm 2. Hemostasis obtained with pressure. 1/2 inch packing placed. No complications.  Heather Hamman, MD FACS General Surgeon 10/19/2018, 5:14 PM

## 2018-10-21 ENCOUNTER — Telehealth: Payer: Self-pay | Admitting: Internal Medicine

## 2018-10-21 NOTE — Telephone Encounter (Signed)
Attempted to call patient regarding lab results. No answer, left message for her to call the office back and ask to speak with a triage nurse regarding her labs and antibiotics.

## 2018-10-21 NOTE — Telephone Encounter (Signed)
Copied from [place] [id number]. Topic: Quick Communication - Lab Results (Clinic Use ONLY) >> Oct 21, 2018 11:50 AM Neta Ehlers, RMA wrote: Called patient to inform them of 10/15/2018 lab results. When patient returns call, triage nurse may disclose results. >> Oct 21, 2018 12:14 PM Bea Graff, NT wrote: Pt states she read her results in [place] and does not have any questions. She states she is aware to stop taking her antibiotic.

## 2018-10-28 ENCOUNTER — Encounter: Payer: Self-pay | Admitting: Surgery

## 2018-10-28 ENCOUNTER — Ambulatory Visit: Payer: BC Managed Care – PPO | Admitting: Surgery

## 2018-10-28 ENCOUNTER — Other Ambulatory Visit: Payer: Self-pay

## 2018-10-28 VITALS — BP 180/81 | HR 82 | Temp 97.7°F | Resp 18 | Ht 66.0 in | Wt 273.8 lb

## 2018-10-28 DIAGNOSIS — Z975 Presence of (intrauterine) contraceptive device: Secondary | ICD-10-CM | POA: Insufficient documentation

## 2018-10-28 DIAGNOSIS — Z09 Encounter for follow-up examination after completed treatment for conditions other than malignant neoplasm: Secondary | ICD-10-CM

## 2018-10-28 HISTORY — DX: Presence of (intrauterine) contraceptive device: Z97.5

## 2018-10-28 NOTE — Patient Instructions (Signed)
Please continue to pack for 10 days.   Please call our office if you have questions or concerns.

## 2018-10-30 ENCOUNTER — Encounter: Payer: Self-pay | Admitting: Surgery

## 2018-10-30 NOTE — Progress Notes (Signed)
S/p I/d absces Doing well No fevers or chills  PE NAD Wound healing well good granulation, no infection, measures 2.5x 1 cms  A/p Doing well continue current wound care F/U prn

## 2018-11-06 LAB — HM PAP SMEAR: HM Pap smear: NORMAL

## 2018-11-10 ENCOUNTER — Other Ambulatory Visit: Payer: Self-pay

## 2018-11-10 ENCOUNTER — Encounter
Admission: RE | Admit: 2018-11-10 | Discharge: 2018-11-10 | Disposition: A | Discharge status: Home / Self Care | Payer: BC Managed Care – PPO | Source: Ambulatory Visit | Attending: Obstetrics & Gynecology | Admitting: Obstetrics & Gynecology

## 2018-11-10 DIAGNOSIS — Z01818 Encounter for other preprocedural examination: Secondary | ICD-10-CM | POA: Insufficient documentation

## 2018-11-10 HISTORY — DX: Cardiomyopathy, unspecified: I42.9

## 2018-11-10 LAB — CBC
HCT: 38.5 % (ref 36.0–46.0)
HEMOGLOBIN: 12.3 g/dL (ref 12.0–15.0)
MCH: 31.8 pg (ref 26.0–34.0)
MCHC: 31.9 g/dL (ref 30.0–36.0)
MCV: 99.5 fL (ref 80.0–100.0)
Platelets: 216 10*3/uL (ref 150–400)
RBC: 3.87 MIL/uL (ref 3.87–5.11)
RDW: 14.1 % (ref 11.5–15.5)
WBC: 4.6 10*3/uL (ref 4.0–10.5)
nRBC: 0 % (ref 0.0–0.2)

## 2018-11-10 LAB — BASIC METABOLIC PANEL
Anion gap: 5 (ref 5–15)
BUN: 11 mg/dL (ref 6–20)
CO2: 25 mmol/L (ref 22–32)
Calcium: 9 mg/dL (ref 8.9–10.3)
Chloride: 107 mmol/L (ref 98–111)
Creatinine, Ser: 0.74 mg/dL (ref 0.44–1.00)
GFR calc Af Amer: >60 mL/min (ref >60)
GFR calc non Af Amer: >60 mL/min (ref >60)
Glucose, Bld: 107 mg/dL — ABNORMAL HIGH (ref 70–99)
Potassium: 3.6 mmol/L (ref 3.5–5.1)
Sodium: 137 mmol/L (ref 135–145)

## 2018-11-10 NOTE — Patient Instructions (Signed)
Your procedure is scheduled on: Friday, November 13, 2018  Report to [facility]     DO NOT STOP ON THE FIRST FLOOR TO REGISTER  To find out your arrival time please call [phone] between 1PM - 3PM on Thursday, November 12, 2018  Remember: Instructions that are not followed completely may result in serious medical risk,  up to and including death, or upon the discretion of your surgeon and anesthesiologist your  surgery may need to be rescheduled.     _X__ 1. Do not eat food after midnight the night before your procedure.                 No gum chewing or hard candies.                   ABSOLUTELY NOTHING SOLID IN YOUR MOUTH AFTER MIDNIGHT                 You may drink clear liquids up to 2 hours before you are scheduled to arrive for your surgery-                  DO not drink clear liquids within 2 hours of the start of your surgery.                  Clear Liquids include:  water, apple juice without pulp, clear carbohydrate                 drink such as Clearfast of Gatorade, Black Coffee or Tea (Do not add                 anything to coffee or tea). DO NOT ADD DAIRY PRODUCTS OR HONEY OR LEMON  __X__2.  On the morning of surgery brush your teeth with toothpaste and water,                  You may rinse your mouth with mouthwash if you wish.                      Do not swallow any toothpaste of mouthwash.     _X__ 3.  No Alcohol for 24 hours before or after surgery.   _X__ 4.  Do Not Smoke or use e-cigarettes For 24 Hours Prior to Your Surgery.                 Do not use any chewable tobacco products for at least 6 hours prior to                 surgery.  ____  5.  Bring all medications with you on the day of surgery if instructed.   ____  6.  Notify your doctor if there is any change in your medical condition      (cold, fever, infections).     Do not wear jewelry, make-up, hairpins, clips or nail polish. Do not wear  lotions, powders, or perfumes. You may wear deodorant. Do not shave 48 hours prior to surgery. Men may shave face and neck. Do not bring valuables to the hospital.    [facility] is not responsible for any belongings or valuables.  Contacts, dentures or bridgework may not be worn into surgery. Leave your suitcase in the car. After surgery it may be brought to your room. For patients admitted to the hospital, discharge time is determined by your treatment team.  Patients discharged the day of surgery will not be allowed to drive home.   Please read over the following fact sheets that you were given:   PREPARING FOR SURGERY  __X__ Take these medicines the morning of surgery with A SIP OF WATER:    1. CARVEDILOL  2. FLONASE IF NEEDED  3. CLARITIN, IF NEEDED  4.  5.  6.  ____ Fleet Enema (as directed)   _X_ Use ANTIBACTERIAL SOAP   ____ Use inhalers on the day of surgery  _X_ Stop ASPIRIN PRODUCTS AS OF TODAY  _X__ Stop Anti-inflammatories AS OF TODAY                TYLENOL IS OKAY AT ANY TIME   __X__ Stop supplements until after surgery.            PROBIOTIC / VITAMIN D - DO NOT TAKE ON DAY OF SURGERY  CONTINUE TAKING LISINOPRIL AND LASIX EACH DAY BEFORE SURGERY           BUT DO NOT TAKE ON THE MORNING OF SURGERY.  BRING THE DOSAGE INFORMATION FOR VITAMIN D    BRING THE ADVANCE DIRECTIVES IF YOU HAVE COMPLETED IT SO THAT     WE MAY MAKE A COPY FOR YOUR CHART.  WEAR LOOSE AND COMFORTABLE FITTING CLOTHES.   ____ Bring C-Pap to the hospital.

## 2018-11-13 ENCOUNTER — Encounter: Payer: Self-pay | Admitting: *Deleted

## 2018-11-13 ENCOUNTER — Encounter
Admission: RE | Disposition: A | Discharge status: Home / Self Care | Payer: Self-pay | Source: Home / Self Care | Attending: Obstetrics & Gynecology

## 2018-11-13 ENCOUNTER — Ambulatory Visit: Payer: BC Managed Care – PPO | Admitting: Certified Registered Nurse Anesthetist

## 2018-11-13 ENCOUNTER — Ambulatory Visit
Admission: RE | Admit: 2018-11-13 | Discharge: 2018-11-13 | Disposition: A | Discharge status: Home / Self Care | Payer: BC Managed Care – PPO | Attending: Obstetrics & Gynecology | Admitting: Obstetrics & Gynecology

## 2018-11-13 DIAGNOSIS — I509 Heart failure, unspecified: Secondary | ICD-10-CM | POA: Diagnosis not present

## 2018-11-13 DIAGNOSIS — Z6841 Body Mass Index (BMI) 40.0 and over, adult: Secondary | ICD-10-CM | POA: Diagnosis not present

## 2018-11-13 DIAGNOSIS — J309 Allergic rhinitis, unspecified: Secondary | ICD-10-CM | POA: Insufficient documentation

## 2018-11-13 DIAGNOSIS — Z79899 Other long term (current) drug therapy: Secondary | ICD-10-CM | POA: Insufficient documentation

## 2018-11-13 DIAGNOSIS — Z791 Long term (current) use of non-steroidal anti-inflammatories (NSAID): Secondary | ICD-10-CM | POA: Diagnosis not present

## 2018-11-13 DIAGNOSIS — Z7982 Long term (current) use of aspirin: Secondary | ICD-10-CM | POA: Diagnosis not present

## 2018-11-13 DIAGNOSIS — Z87891 Personal history of nicotine dependence: Secondary | ICD-10-CM | POA: Diagnosis not present

## 2018-11-13 DIAGNOSIS — Z7951 Long term (current) use of inhaled steroids: Secondary | ICD-10-CM | POA: Diagnosis not present

## 2018-11-13 DIAGNOSIS — I110 Hypertensive heart disease with heart failure: Secondary | ICD-10-CM | POA: Diagnosis not present

## 2018-11-13 DIAGNOSIS — Z30433 Encounter for removal and reinsertion of intrauterine contraceptive device: Secondary | ICD-10-CM | POA: Diagnosis present

## 2018-11-13 HISTORY — PX: HYSTEROSCOPY: SHX211

## 2018-11-13 SURGERY — HYSTEROSCOPY
Anesthesia: General

## 2018-11-13 MED ORDER — PROPOFOL 10 MG/ML IV BOLUS
INTRAVENOUS | Status: DC | PRN
  Administered 2018-11-13: 200 mg via INTRAVENOUS

## 2018-11-13 MED ORDER — FAMOTIDINE 20 MG PO TABS
20.0000 mg | ORAL_TABLET | Freq: Once | ORAL | Status: AC
  Administered 2018-11-13: 20 mg via ORAL

## 2018-11-13 MED ORDER — GLYCOPYRROLATE 0.2 MG/ML IJ SOLN
INTRAMUSCULAR | Status: DC | PRN
  Administered 2018-11-13: 0.2 mg via INTRAVENOUS

## 2018-11-13 MED ORDER — ONDANSETRON HCL 4 MG/2ML IJ SOLN
INTRAMUSCULAR | Status: AC
  Filled 2018-11-13: qty 2

## 2018-11-13 MED ORDER — FENTANYL CITRATE (PF) 100 MCG/2ML IJ SOLN
INTRAMUSCULAR | Status: AC
  Filled 2018-11-13: qty 2

## 2018-11-13 MED ORDER — DEXAMETHASONE SODIUM PHOSPHATE 10 MG/ML IJ SOLN
INTRAMUSCULAR | Status: AC
  Filled 2018-11-13: qty 1

## 2018-11-13 MED ORDER — FENTANYL CITRATE (PF) 100 MCG/2ML IJ SOLN
INTRAMUSCULAR | Status: AC
  Administered 2018-11-13: 25 ug via INTRAVENOUS
  Filled 2018-11-13: qty 2

## 2018-11-13 MED ORDER — ROCURONIUM BROMIDE 50 MG/5ML IV SOLN
INTRAVENOUS | Status: AC
  Filled 2018-11-13: qty 1

## 2018-11-13 MED ORDER — LIDOCAINE HCL (CARDIAC) PF 100 MG/5ML IV SOSY
PREFILLED_SYRINGE | INTRAVENOUS | Status: DC | PRN
  Administered 2018-11-13: 100 mg via INTRAVENOUS

## 2018-11-13 MED ORDER — SUGAMMADEX SODIUM 500 MG/5ML IV SOLN
INTRAVENOUS | Status: DC | PRN
  Administered 2018-11-13: 500 mg via INTRAVENOUS

## 2018-11-13 MED ORDER — FAMOTIDINE 20 MG PO TABS
ORAL_TABLET | ORAL | Status: AC
  Filled 2018-11-13: qty 1

## 2018-11-13 MED ORDER — MIDAZOLAM HCL 2 MG/2ML IJ SOLN
INTRAMUSCULAR | Status: DC | PRN
  Administered 2018-11-13: 1 mg via INTRAVENOUS

## 2018-11-13 MED ORDER — LACTATED RINGERS IV SOLN
INTRAVENOUS | Status: DC
  Administered 2018-11-13: 100 mL/h via INTRAVENOUS

## 2018-11-13 MED ORDER — FENTANYL CITRATE (PF) 100 MCG/2ML IJ SOLN
25.0000 ug | INTRAMUSCULAR | Status: DC | PRN
  Administered 2018-11-13 (×2): 25 ug via INTRAVENOUS

## 2018-11-13 MED ORDER — FENTANYL CITRATE (PF) 100 MCG/2ML IJ SOLN
INTRAMUSCULAR | Status: DC | PRN
  Administered 2018-11-13: 50 ug via INTRAVENOUS

## 2018-11-13 MED ORDER — ROCURONIUM BROMIDE 100 MG/10ML IV SOLN
INTRAVENOUS | Status: DC | PRN
  Administered 2018-11-13: 50 mg via INTRAVENOUS

## 2018-11-13 MED ORDER — CARVEDILOL 12.5 MG PO TABS
12.5000 mg | ORAL_TABLET | Freq: Once | ORAL | Status: AC
  Administered 2018-11-13: 12.5 mg via ORAL
  Filled 2018-11-13: qty 1

## 2018-11-13 MED ORDER — PHENYLEPHRINE HCL 10 MG/ML IJ SOLN
INTRAMUSCULAR | Status: DC | PRN
  Administered 2018-11-13: 200 ug via INTRAVENOUS

## 2018-11-13 MED ORDER — PROPOFOL 10 MG/ML IV BOLUS
INTRAVENOUS | Status: AC
  Filled 2018-11-13: qty 20

## 2018-11-13 MED ORDER — MIDAZOLAM HCL 2 MG/2ML IJ SOLN
INTRAMUSCULAR | Status: AC
  Filled 2018-11-13: qty 2

## 2018-11-13 MED ORDER — ONDANSETRON HCL 4 MG/2ML IJ SOLN: 4.0000 mg | Freq: Once | INTRAMUSCULAR | Status: DC | PRN

## 2018-11-13 SURGICAL SUPPLY — 19 items
CATH ROBINSON RED A/P 16FR (CATHETERS) ×3 IMPLANT
COVER WAND RF STERILE (DRAPES) ×3 IMPLANT
ELECT REM PT RETURN 9FT ADLT (ELECTROSURGICAL) ×3
ELECTRODE REM PT RTRN 9FT ADLT (ELECTROSURGICAL) ×1 IMPLANT
GLOVE PI ORTHOPRO 6.5 (GLOVE) ×2
GLOVE PI ORTHOPRO STRL 6.5 (GLOVE) ×1 IMPLANT
GOWN STRL REUS W/ TWL LRG LVL3 (GOWN DISPOSABLE) ×2 IMPLANT
GOWN STRL REUS W/TWL LRG LVL3 (GOWN DISPOSABLE) ×4
KIT PROCEDURE FLUENT (KITS) IMPLANT
KIT TURNOVER CYSTO (KITS) ×3 IMPLANT
NEEDLE SPNL 22GX3.5 QUINCKE BK (NEEDLE) IMPLANT
NS IRRIG 1000ML POUR BTL (IV SOLUTION) ×3 IMPLANT
PACK DNC HYST (MISCELLANEOUS) ×3 IMPLANT
PAD OB MATERNITY 4.3X12.25 (PERSONAL CARE ITEMS) ×3 IMPLANT
PAD PREP 24X41 OB/GYN DISP (PERSONAL CARE ITEMS) ×3 IMPLANT
SYR 10ML LL (SYRINGE) ×3 IMPLANT
TUBING CONNECTING 10 (TUBING) ×2 IMPLANT
TUBING CONNECTING 10' (TUBING) ×1
mirena intra uterine device ×3 IMPLANT

## 2018-11-13 NOTE — Discharge Instructions (Signed)
You should expect to have some cramping and vaginal bleeding for about a week. This should taper off and subside, much like a period. If heavy bleeding continues or gets worse, you should contact the office for an earlier appointment.   Please call the office or physician on call for fever >101, severe pain, and heavy bleeding.   [place]!!   Hysteroscopy, Care After This sheet gives you information about how to care for yourself after your procedure. Your health care provider may also give you more specific instructions. If you have problems or questions, contact your health care provider. What can I expect after the procedure? After the procedure, it is common to have:  Cramping.  Bleeding. This can vary from light spotting to menstrual-like bleeding. Follow these instructions at home: Activity  Rest for 1-2 days after the procedure.  Do not douche, use tampons, or have sex for 2 weeks after the procedure, or until your health care provider approves.  Do not drive for 24 hours after the procedure, or for as long as told by your health care provider.  Do not drive, use heavy machinery, or drink alcohol while taking prescription pain medicines. Medicines   Take over-the-counter and prescription medicines only as told by your health care provider.  Do not take aspirin during recovery. It can increase the risk of bleeding. General instructions  Do not take baths, swim, or use a hot tub until your health care provider approves. Take showers instead of baths for 2 weeks, or for as long as told by your health care provider.  To prevent or treat constipation while you are taking prescription pain medicine, your health care provider may recommend that you: ? Drink enough fluid to keep your urine clear or pale yellow. ? Take over-the-counter or prescription medicines. ? Eat foods that are high in fiber, such as fresh fruits and vegetables, whole  grains, and beans. ? Limit foods that are high in fat and processed sugars, such as fried and sweet foods.  Keep all follow-up visits as told by your health care provider. This is important. Contact a health care provider if:  You feel dizzy or lightheaded.  You feel nauseous.  You have abnormal vaginal discharge.  You have a rash.  You have pain that does not get better with medicine.  You have chills. Get help right away if:  You have bleeding that is heavier than a normal menstrual period.  You have a fever.  You have pain or cramps that get worse.  You develop new abdominal pain.  You faint.  You have pain in your shoulders.  You have shortness of breath. Summary  After the procedure, you may have cramping and some vaginal bleeding.  Do not douche, use tampons, or have sex for 2 weeks after the procedure, or until your health care provider approves.  Do not take baths, swim, or use a hot tub until your health care provider approves. Take showers instead of baths for 2 weeks, or for as long as told by your health care provider.  Report any unusual symptoms to your health care provider.  Keep all follow-up visits as told by your health care provider. This is important. This information is not intended to replace advice given to you by your health care provider. Make sure you discuss any questions you have with your health care provider. Document Released: 07/21/2013 Document Revised: 10/29/2016 Document Reviewed: 10/29/2016 Elsevier Interactive Patient  Education  2019 [place]   1) The drugs that you were given will stay in your system until tomorrow so for the next 24 hours you should not:  A) Drive an automobile B) Make any legal decisions C) Drink any alcoholic beverage   2) You may resume regular meals tomorrow.  Today it is better to start with liquids and gradually work up to solid foods.  You may  eat anything you prefer, but it is better to start with liquids, then soup and crackers, and gradually work up to solid foods.   3) Please notify your doctor immediately if you have any unusual bleeding, trouble breathing, redness and pain at the surgery site, drainage, fever, or pain not relieved by medication.    4) Additional Instructions:        Please contact your physician with any problems or Same Day Surgery at [phone], Monday through Friday 6 am to 4 pm, or [place] at [facility] number at [phone].

## 2018-11-13 NOTE — Anesthesia Preprocedure Evaluation (Signed)
Anesthesia Evaluation  Patient identified by MRN, date of birth, ID band Patient awake    Reviewed: Allergy & Precautions, NPO status , Patient's Chart, lab work & pertinent test results  History of Anesthesia Complications Negative for: history of anesthetic complications  Airway Mallampati: II       Dental   Pulmonary neg sleep apnea, neg COPD, former smoker,           Cardiovascular hypertension, Pt. on medications and Pt. 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%)  (-) Past MI (-) dysrhythmias (-) Valvular Problems/Murmurs     Neuro/Psych neg Seizures    GI/Hepatic Neg liver ROS, neg GERD  ,  Endo/Other  neg diabetesMorbid obesity  Renal/GU negative Renal ROS     Musculoskeletal   Abdominal   Peds  Hematology   Anesthesia Other Findings   Reproductive/Obstetrics                             Anesthesia Physical Anesthesia Plan  ASA: III  Anesthesia Plan: General   Post-op Pain Management:    Induction: Intravenous  PONV Risk Score and Plan: 3 and Dexamethasone, Ondansetron and Midazolam  Airway Management Planned: Oral ETT  Additional Equipment:   Intra-op Plan:   Post-operative Plan:   Informed Consent: I have reviewed the patients History and Physical, chart, labs and discussed the procedure including the risks, benefits and alternatives for the proposed anesthesia with the patient or authorized representative who has indicated his/her understanding and acceptance.       Plan Discussed with:   Anesthesia Plan Comments:         Anesthesia Quick Evaluation

## 2018-11-13 NOTE — Anesthesia Procedure Notes (Signed)
Procedure Name: Intubation Date/Time: 11/13/2018 1:29 PM Performed by: Johnna Acosta, CRNA Pre-anesthesia Checklist: Patient identified, Emergency Drugs available, Suction available, Patient being monitored and Timeout performed Patient Re-evaluated:Patient Re-evaluated prior to induction Oxygen Delivery Method: Circle system utilized Preoxygenation: Pre-oxygenation with 100% oxygen Induction Type: IV induction Ventilation: Mask ventilation without difficulty, Two handed mask ventilation required and Oral airway inserted - appropriate to patient size Laryngoscope Size: Sabra Heck and 2 Grade View: Grade II Tube type: Oral Tube size: 7.5 mm Number of attempts: 2 Airway Equipment and Method: Stylet and Oral airway Placement Confirmation: ETT inserted through vocal cords under direct vision,  positive ETCO2 and breath sounds checked- equal and bilateral Secured at: 21 cm Tube secured with: Tape Dental Injury: Teeth and Oropharynx as per pre-operative assessment

## 2018-11-13 NOTE — Anesthesia Post-op Follow-up Note (Signed)
Anesthesia QCDR form completed.        

## 2018-11-13 NOTE — Anesthesia Postprocedure Evaluation (Signed)
Anesthesia Post Note  Patient: Heather Case  Procedure(s) Performed: HYSTEROSCOPY, remove/ replace Mirena (N/A )  Patient location during evaluation: PACU Anesthesia Type: General Level of consciousness: awake and alert Pain management: pain level controlled Vital Signs Assessment: post-procedure vital signs reviewed and stable Respiratory status: spontaneous breathing and respiratory function stable Cardiovascular status: stable Anesthetic complications: no     Last Vitals:  Vitals:   11/13/18 1436 11/13/18 1438  BP:  122/73  Pulse: (!) 55 (!) 55  Resp: 12 12  Temp:    SpO2: 100% 100%    Last Pain:  Vitals:   11/13/18 1436  TempSrc:   PainSc: 5                  [name] K

## 2018-11-13 NOTE — Op Note (Signed)
Operative Report Hysteroscopy, Dilation and Curettage 11/13/2018  Patient:  Heather Case  48 y.o. female Preoperative diagnosis:  retained IUD - Mirena Postoperative diagnosis:  retained IUD, insertion of IUD  PROCEDURE:  Procedure(s): HYSTEROSCOPY, remove/ replace Mirena (N/A) Surgeon:  Surgeon(s) and Role:    * , Honor Loh, MD - Primary Anesthesia:  GET I/O: Total I/O In: 800 [I.V.:800] Out: 0  Specimens:  Endometrial curettings Complications: None Apparent Disposition:  VS stable to PACU  Findings: Uterus, mobile, larger than normal size, sounding to 12 cm; normal cervix, vagina, perineum.  IUD seen with strings at the fundus, removed intact   Indication for procedure/Consents: 48 y.o. G2P2000  here for scheduled surgery for retained IUD.  She would like one replaced.  Risks of surgery were discussed with the patient including but not limited to: bleeding which may require transfusion; infection which may require antibiotics; injury to uterus or surrounding organs; intrauterine scarring which may impair future fertility; need for additional procedures including laparotomy or laparoscopy; and other postoperative/anesthesia complications. Written informed consent was obtained.    Procedure Details:   The patient was then taken to the operating room where anesthesia was administered.  After a formal timeout was performed, she was placed in the dorsal lithotomy position and examined with the above findings. She was then prepped and draped in the sterile manner.  A speculum was then placed in the patient's vagina and a single tooth tenaculum was applied to the anterior lip of the cervix.    The uterus was sounded to 12cm. Her cervix was serially dilated to accommodate the hysteroscope, with findings as above. The hysteroscopic grasper was unable to be threaded through the hysteroscope, so the camera was removed and the grasper inserted blindly.  The strings were grasped at the  fundus and the IUD was removed without difficulty.  The new Mirena was then opened and placed at the fundus in the typical fashion.  Strings were cut to 3cm beyond the cervix and tucked behind the cervix.  The tenaculum was removed from the anterior lip of the cervix and the vaginal speculum was removed after noting good hemostasis. The patient tolerated the procedure well and was taken to the recovery area awake, extubated and in stable condition.  The patient will be discharged to home as per PACU criteria.  Routine postoperative instructions given. She will follow up in the clinic in four weeks for IUD evaluation.  Larey Days, MD [facility] OBGYN Attending Gynecologist

## 2018-11-13 NOTE — Progress Notes (Signed)
Heart rate 48 to 60  No new orders from dr Ronelle Nigh

## 2018-11-13 NOTE — Transfer of Care (Signed)
Immediate Anesthesia Transfer of Care Note  Patient: Heather Case  Procedure(s) Performed: HYSTEROSCOPY, remove/ replace Mirena (N/A )  Patient Location: PACU  Anesthesia Type:General  Level of Consciousness: awake and alert   Airway & Oxygen Therapy: Patient Spontanous Breathing and Patient connected to face mask oxygen  Post-op Assessment: Report given to RN and Post -op Vital signs reviewed and stable  Post vital signs: Reviewed and stable  Last Vitals:  Vitals Value Taken Time  BP 110/70 11/13/2018  2:23 PM  Temp 36.3 C 11/13/2018  2:23 PM  Pulse 56 11/13/2018  2:24 PM  Resp 16 11/13/2018  2:23 PM  SpO2 100 % 11/13/2018  2:24 PM  Vitals shown include unvalidated device data.  Last Pain:  Vitals:   11/13/18 1423  TempSrc: Temporal  PainSc:          Complications: No apparent anesthesia complications

## 2018-11-13 NOTE — H&P (Signed)
Preoperative History and Physical  Heather Case is a 48 y.o. G2P2000 here for surgical management of retained IUD.   No significant preoperative concerns.  Proposed surgery: removal and reinsertion of IUD, hysteroscopy  Past Medical History:  Diagnosis Date  . Allergic rhinitis, seasonal   . Cardiomyopathy ([place])    post partum  . History of CHF (congestive heart failure)   . Hordeolum externum of left eye   . HSV-2 infection   . HTN, goal below 140/90   . Left ear pain   . Obesity, Class III, BMI 40-49.9 (morbid obesity) ([place])   . Vitamin D deficiency    Past Surgical History:  Procedure Laterality Date  . BREAST BIOPSY Right 1994   Negative  . BREAST LUMPECTOMY     benign (at age 45yr)  . CESAREAN SECTION     x's 2  . COLONOSCOPY  12/2008   Dr. Kenton Kingfisher, follow-up ASCUS   OB History  Gravida Para Term Preterm AB Living  2 2 2         SAB TAB Ectopic Multiple Live Births               # Outcome Date GA Lbr Len/2nd Weight Sex Delivery Anes PTL Lv  2 Term           1 Term           Patient denies any other pertinent gynecologic issues.   No current facility-administered medications on file prior to encounter.    Current Outpatient Medications on File Prior to Encounter  Medication Sig Dispense Refill  . Cholecalciferol 125 MCG (5000 UT) TABS Take 1 tablet by mouth daily.    . furosemide (LASIX) 20 MG tablet Take 20 mg by mouth daily.  11  . lisinopril (PRINIVIL,ZESTRIL) 40 MG tablet Take 1 tablet (40 mg total) by mouth daily. 90 tablet 2  . Probiotic Product (PROBIOTIC-10) CAPS Take 1 capsule by mouth daily.     Marland Kitchen aspirin EC 81 MG tablet Take by mouth.    . carvedilol (COREG) 12.5 MG tablet Take 12.5 mg by mouth 2 (two) times daily with a meal.   11  . Cholecalciferol 50000 units capsule Take 1 capsule (50,000 Units total) by mouth once a week. (Patient not taking: Reported on 11/10/2018) 13 capsule 1  . fluticasone (FLONASE) 50 MCG/ACT nasal spray Place 2 sprays into  both nostrils daily. 16 g 11  . loratadine (CLARITIN) 10 MG tablet Take 1 tablet (10 mg total) by mouth daily. (Patient taking differently: Take 10 mg by mouth daily as needed. ) 90 tablet 1  . meloxicam (MOBIC) 15 MG tablet      Allergies  Allergen Reactions  . Penicillin V Rash    Social History:   reports that she quit smoking about 10 years ago. Her smoking use included cigarettes. She smoked 0.25 packs per day. She has never used smokeless tobacco. She reports current alcohol use. She reports that she does not use drugs.  Family History  Problem Relation Age of Onset  . Cancer Mother        breast dx'ed in 68s   . Breast cancer Mother 28  . Hypertension Father   . Diabetes Father   . Cancer Father        colon cancer   . Cancer Maternal Grandmother        breast dx later in life   . Breast cancer Maternal Grandmother   . Breast cancer Paternal  Grandmother   . Cancer Paternal Grandmother        breast cancer dx'ed later in life     Review of Systems: Noncontributory  PHYSICAL EXAM: Blood pressure (!) 159/78, pulse (!) 58, temperature 99.5 F (37.5 C), temperature source Tympanic, resp. rate 14, height 5\' 6"  (1.676 m), weight 123.4 kg, last menstrual period 11/02/2018, SpO2 100 %. General appearance - alert, well appearing, and in no distress Chest - clear to auscultation, no wheezes, rales or rhonchi, symmetric air entry Heart - normal rate and regular rhythm Abdomen - soft, nontender, nondistended, no masses or organomegaly Pelvic - examination not indicated Extremities - peripheral pulses normal, no pedal edema, no clubbing or cyanosis  Labs: Results for orders placed or performed during the hospital encounter of 11/10/18 (from the past 336 hour(s))  Basic metabolic panel   Collection Time: 11/10/18  9:42 AM  Result Value Ref Range   Sodium 137 135 - 145 mmol/L   Potassium 3.6 3.5 - 5.1 mmol/L   Chloride 107 98 - 111 mmol/L   CO2 25 22 - 32 mmol/L   Glucose, Bld  107 (H) 70 - 99 mg/dL   BUN 11 6 - 20 mg/dL   Creatinine, Ser 0.74 0.44 - 1.00 mg/dL   Calcium 9.0 8.9 - 10.3 mg/dL   GFR calc non Af Amer >60 >60 mL/min   GFR calc Af Amer >60 >60 mL/min   Anion gap 5 5 - 15  CBC   Collection Time: 11/10/18  9:42 AM  Result Value Ref Range   WBC 4.6 4.0 - 10.5 K/uL   RBC 3.87 3.87 - 5.11 MIL/uL   Hemoglobin 12.3 12.0 - 15.0 g/dL   HCT 38.5 36.0 - 46.0 %   MCV 99.5 80.0 - 100.0 fL   MCH 31.8 26.0 - 34.0 pg   MCHC 31.9 30.0 - 36.0 g/dL   RDW 14.1 11.5 - 15.5 %   Platelets 216 150 - 400 K/uL   nRBC 0.0 0.0 - 0.2 %    Imaging Studies: No results found.  Assessment: Patient Active Problem List   Diagnosis Date Noted  . IUD (intrauterine device) in place 10/28/2018  . Vitamin D deficiency 01/29/2018  . Medication monitoring encounter 01/13/2017  . Hx of iron deficiency anemia 01/13/2017  . Left knee pain 12/12/2015  . Atypical squamous cell changes of cervix undetermined significance favor benign 06/13/2015  . Allergic rhinitis, seasonal 06/13/2015  . History of prolonged Q-T interval on ECG 06/13/2015  . Dietary counseling and surveillance 06/13/2015  . CHF NYHA class I (no symptoms from ordinary activities) ([place]) 06/13/2015  . Morbid obesity ([place]) 06/13/2015  . 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 Hypertension goal BP (blood pressure) < 140/90 01/10/2009    Plan: Patient will undergo surgical management with hysteroscopy, removal of IUD, with insertion of IUD.   The risks of surgery were discussed in detail with the patient including but not limited to: bleeding which may require transfusion or reoperation; infection which may require antibiotics; injury to surrounding organs which may involve bowel, bladder, ureters ; need for additional procedures including laparoscopy or laparotomy; thromboembolic phenomenon, surgical site problems and other postoperative/anesthesia complications. Likelihood of success in alleviating the  patient's condition was discussed. Routine postoperative instructions will be reviewed with the patient and her family in detail after surgery.  The patient concurred with the proposed plan, giving informed written consent for the surgery.  Patient has been NPO since last night she will remain  NPO for procedure.  Anesthesia and OR aware. To OR when ready.  ----- Larey Days, MD Attending Obstetrician and Gynecologist [facility], Department of [facility]

## 2018-12-28 ENCOUNTER — Other Ambulatory Visit: Payer: BC Managed Care – PPO

## 2019-02-02 ENCOUNTER — Ambulatory Visit: Payer: BC Managed Care – PPO | Admitting: Internal Medicine

## 2019-02-16 ENCOUNTER — Other Ambulatory Visit: Payer: Self-pay | Admitting: Internal Medicine

## 2019-02-16 DIAGNOSIS — J309 Allergic rhinitis, unspecified: Secondary | ICD-10-CM

## 2019-02-16 MED ORDER — FLUTICASONE PROPIONATE 50 MCG/ACT NA SUSP: 2.0000 | Freq: Every day | NASAL | 11 refills | Status: DC

## 2019-03-17 ENCOUNTER — Other Ambulatory Visit: Payer: Self-pay | Admitting: Internal Medicine

## 2019-03-17 ENCOUNTER — Encounter: Payer: Self-pay | Admitting: Internal Medicine

## 2019-03-17 DIAGNOSIS — B009 Herpesviral infection, unspecified: Secondary | ICD-10-CM

## 2019-03-17 MED ORDER — VALACYCLOVIR HCL 1 G PO TABS: 1000.0000 mg | ORAL_TABLET | Freq: Two times a day (BID) | ORAL | 11 refills | Status: DC

## 2019-06-04 ENCOUNTER — Encounter (INDEPENDENT_AMBULATORY_CARE_PROVIDER_SITE_OTHER): Payer: BC Managed Care – PPO | Admitting: Internal Medicine

## 2019-06-04 ENCOUNTER — Other Ambulatory Visit: Payer: Self-pay | Admitting: Internal Medicine

## 2019-06-04 DIAGNOSIS — J40 Bronchitis, not specified as acute or chronic: Secondary | ICD-10-CM

## 2019-06-04 DIAGNOSIS — R05 Cough: Secondary | ICD-10-CM

## 2019-06-04 DIAGNOSIS — J309 Allergic rhinitis, unspecified: Secondary | ICD-10-CM

## 2019-06-04 DIAGNOSIS — R062 Wheezing: Secondary | ICD-10-CM

## 2019-06-04 DIAGNOSIS — R059 Cough, unspecified: Secondary | ICD-10-CM

## 2019-06-04 MED ORDER — ALBUTEROL SULFATE HFA 108 (90 BASE) MCG/ACT IN AERS: 1.0000 | INHALATION_SPRAY | Freq: Four times a day (QID) | RESPIRATORY_TRACT | 0 refills | Status: DC | PRN

## 2019-06-04 MED ORDER — MONTELUKAST SODIUM 10 MG PO TABS: 10.0000 mg | ORAL_TABLET | Freq: Every day | ORAL | 0 refills | Status: DC

## 2019-06-04 MED ORDER — AZITHROMYCIN 250 MG PO TABS: ORAL_TABLET | ORAL | 0 refills | Status: DC

## 2019-06-04 NOTE — Telephone Encounter (Signed)
Yes I agree I have no other symptoms  No I have not been around anyone or exposed to my knowledge.  Dr. Kelly Services If we evaluate you via mychart we have to bill your insurance, are you agreeable to this?  What other symptoms do you have?  Also I recommend a Chest Xray  Have you been around anyone or exposed to covid?     06/04/19 8:40 AM Dr Olivia Mackie. I have been very congested probably for a couple of months at this point. I am wheezing at times I took some. Mucinex I have no other symptoms. The last time I was like this I had a breathing treatment in your office. What would you advise?   A/P 1. Limited evaluation 2/2 my chart and not seen in person suspect URI/allergies/bronchitis   1. Continue Mucinex DM for cough as needed  2. Warm tea with honey and lemon 3. Sent Zpack antibiotic  3. Albuterol inhaler  4. Singular to take with your nasal saline, flonase and claritin  5. Please get a Chest Xray Monday at [facility] outpatient imaging near [place] you walk in without and appointment   If you are not better let me know early next week of go to the ED  Time spent 5-10 minutes pt agrees to fee  [place]

## 2019-06-08 ENCOUNTER — Ambulatory Visit
Admission: RE | Admit: 2019-06-08 | Discharge: 2019-06-08 | Disposition: A | Discharge status: Home / Self Care | Payer: BC Managed Care – PPO | Source: Ambulatory Visit | Attending: Internal Medicine | Admitting: Internal Medicine

## 2019-06-08 ENCOUNTER — Other Ambulatory Visit: Payer: Self-pay

## 2019-06-08 ENCOUNTER — Ambulatory Visit
Admission: RE | Admit: 2019-06-08 | Discharge: 2019-06-08 | Disposition: A | Discharge status: Home / Self Care | Payer: BC Managed Care – PPO | Attending: Internal Medicine | Admitting: Internal Medicine

## 2019-06-08 DIAGNOSIS — R05 Cough: Secondary | ICD-10-CM

## 2019-06-08 DIAGNOSIS — R062 Wheezing: Secondary | ICD-10-CM | POA: Diagnosis present

## 2019-06-08 DIAGNOSIS — R059 Cough, unspecified: Secondary | ICD-10-CM

## 2019-06-25 ENCOUNTER — Encounter: Payer: Self-pay | Admitting: Internal Medicine

## 2019-06-25 NOTE — Telephone Encounter (Signed)
She has not had appt in office or virtual recently. I would recommend appt before we just prescribe something for her.

## 2019-06-29 ENCOUNTER — Ambulatory Visit (INDEPENDENT_AMBULATORY_CARE_PROVIDER_SITE_OTHER): Payer: BC Managed Care – PPO | Admitting: Internal Medicine

## 2019-06-29 ENCOUNTER — Other Ambulatory Visit: Payer: Self-pay

## 2019-06-29 ENCOUNTER — Encounter: Payer: Self-pay | Admitting: Internal Medicine

## 2019-06-29 VITALS — Ht 66.0 in | Wt 276.0 lb

## 2019-06-29 DIAGNOSIS — R05 Cough: Secondary | ICD-10-CM

## 2019-06-29 DIAGNOSIS — Z0184 Encounter for antibody response examination: Secondary | ICD-10-CM

## 2019-06-29 DIAGNOSIS — L309 Dermatitis, unspecified: Secondary | ICD-10-CM

## 2019-06-29 DIAGNOSIS — R062 Wheezing: Secondary | ICD-10-CM | POA: Diagnosis not present

## 2019-06-29 DIAGNOSIS — Z1211 Encounter for screening for malignant neoplasm of colon: Secondary | ICD-10-CM

## 2019-06-29 DIAGNOSIS — I10 Essential (primary) hypertension: Secondary | ICD-10-CM | POA: Diagnosis not present

## 2019-06-29 DIAGNOSIS — Z1389 Encounter for screening for other disorder: Secondary | ICD-10-CM

## 2019-06-29 DIAGNOSIS — Z1231 Encounter for screening mammogram for malignant neoplasm of breast: Secondary | ICD-10-CM

## 2019-06-29 DIAGNOSIS — R739 Hyperglycemia, unspecified: Secondary | ICD-10-CM

## 2019-06-29 DIAGNOSIS — J40 Bronchitis, not specified as acute or chronic: Secondary | ICD-10-CM | POA: Diagnosis not present

## 2019-06-29 DIAGNOSIS — Z1329 Encounter for screening for other suspected endocrine disorder: Secondary | ICD-10-CM

## 2019-06-29 DIAGNOSIS — Z1159 Encounter for screening for other viral diseases: Secondary | ICD-10-CM

## 2019-06-29 DIAGNOSIS — E559 Vitamin D deficiency, unspecified: Secondary | ICD-10-CM

## 2019-06-29 DIAGNOSIS — R059 Cough, unspecified: Secondary | ICD-10-CM

## 2019-06-29 MED ORDER — HYDROCORTISONE 2.5 % EX OINT: TOPICAL_OINTMENT | Freq: Two times a day (BID) | CUTANEOUS | 0 refills | Status: DC

## 2019-06-29 MED ORDER — BUDESONIDE-FORMOTEROL FUMARATE 80-4.5 MCG/ACT IN AERO: 2.0000 | INHALATION_SPRAY | Freq: Two times a day (BID) | RESPIRATORY_TRACT | 12 refills | Status: DC

## 2019-06-29 MED ORDER — PREDNISONE 20 MG PO TABS: 40.0000 mg | ORAL_TABLET | Freq: Every day | ORAL | 0 refills | Status: DC

## 2019-06-29 MED ORDER — MUPIROCIN 2 % EX OINT: 1.0000 "application " | TOPICAL_OINTMENT | Freq: Two times a day (BID) | CUTANEOUS | 1 refills | Status: DC

## 2019-06-29 MED ORDER — LOSARTAN POTASSIUM 50 MG PO TABS: 50.0000 mg | ORAL_TABLET | Freq: Every day | ORAL | 3 refills | Status: DC

## 2019-06-29 MED ORDER — ALBUTEROL SULFATE HFA 108 (90 BASE) MCG/ACT IN AERS: 1.0000 | INHALATION_SPRAY | Freq: Four times a day (QID) | RESPIRATORY_TRACT | 12 refills | Status: DC | PRN

## 2019-06-29 NOTE — Progress Notes (Signed)
Virtual Visit via Video Note  I connected with Heather Case   on 06/29/19 at  8:10 AM EDT by a video enabled telemedicine application and verified that I am speaking with the correct person using two identifiers.  Location patient:work  Location provider:work or home office Persons participating in the virtual visit: patient, provider  I discussed the limitations of evaluation and management by telemedicine and the availability of in person appointments. The patient expressed understanding and agreed to proceed.   HPI: 1. Chronic bronchitis noted CXR 06/08/19 initially she was better but c/o wheezing qhs cough w/o phelgm, no fever. She is allergic to cats and dust and been around cats but not recently. Denies covid exposure wearing mask. Initially she was better with zpack and albuterol use 2-3 x per day but now sxs returned she took mucinex last night. No h/o asthma, cigarette smoking denies and denies 2nd hand cig use. She is also taking claritin and singulair. No nasal congestion, no chills. Also on lisinopril see below. Denies GERD sx's   2. Of note 10/2018 had to have IUD surgically removed and 10/2018 had abscess to back I&D'ed by surgery and area is dry and itchy and using cetaphil lotion which is not helping   3. HTN on lis 40 mg qd advised this can be cause of cough she is agreeable to change BP was 132/86 on 06/18/2019   ROS: See pertinent positives and negatives per HPI.  Past Medical History:  Diagnosis Date  . Allergic rhinitis, seasonal   . Cardiomyopathy ([place])    post partum  . History of CHF (congestive heart failure)   . Hordeolum externum of left eye   . HSV-2 infection   . HTN, goal below 140/90   . Left ear pain   . Obesity, Class III, BMI 40-49.9 (morbid obesity) ([place])   . Vitamin D deficiency     Past Surgical History:  Procedure Laterality Date  . BREAST BIOPSY Right 1994   Negative  . BREAST LUMPECTOMY     benign (at age 81yr)  . CESAREAN SECTION     x's 2  .  COLONOSCOPY  12/2008   Dr. Kenton Kingfisher, follow-up ASCUS  . HYSTEROSCOPY N/A 11/13/2018   Procedure: HYSTEROSCOPY, remove/ replace Mirena;  Surgeon: Ward, Honor Loh, MD;  Location: [facility] ORS;  Service: Gynecology;  Laterality: N/A;    Family History  Problem Relation Age of Onset  . Cancer Mother        breast dx'ed in 45s   . Breast cancer Mother 51  . Hypertension Father   . Diabetes Father   . Cancer Father        colon cancer   . Cancer Maternal Grandmother        breast dx later in life   . Breast cancer Maternal Grandmother   . Breast cancer Paternal Grandmother   . 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  albuterol (VENTOLIN HFA) 108 (90 Base) MCG/ACT inhaler, Inhale 1-2 puffs into the lungs every 6 (six) hours as needed for wheezing or shortness of breath., Disp: 18 g, Rfl: 12 .  aspirin EC 81 MG tablet, Take by mouth., Disp: , Rfl:  .  carvedilol (COREG) 12.5 MG tablet, Take 12.5 mg by mouth 2 (two) times daily with a meal. , Disp: , Rfl: 11 .  Cholecalciferol 125 MCG (5000 UT) TABS, Take 1 tablet by mouth daily., Disp: , Rfl:  .  Cholecalciferol 50000 units capsule, Take 1 capsule (50,000 Units total) by mouth once a week., Disp: 13 capsule, Rfl: 1 .  fluticasone (FLONASE) 50 MCG/ACT nasal spray, Place 2 sprays into both nostrils daily., Disp: 16 g, Rfl: 11 .  furosemide (LASIX) 20 MG tablet, Take 20 mg by mouth daily., Disp: , Rfl: 11 .  loratadine (CLARITIN) 10 MG tablet, Take 1 tablet (10 mg total) by mouth daily. (Patient taking differently: Take 10 mg by mouth daily as needed. ), Disp: 90 tablet, Rfl: 1 .  meloxicam (MOBIC) 15 MG tablet, , Disp: , Rfl:  .  montelukast (SINGULAIR) 10 MG tablet, Take 1 tablet (10 mg total) by mouth at bedtime., Disp: 30 tablet, Rfl: 0 .  Probiotic Product (PROBIOTIC-10) CAPS, Take 1 capsule by mouth daily. , Disp: , Rfl:  .   valACYclovir (VALTREX) 1000 MG tablet, Take 1 tablet (1,000 mg total) by mouth 2 (two) times daily. X 5 to 10 days as needed, Disp: 60 tablet, Rfl: 11 .  budesonide-formoterol (SYMBICORT) 80-4.5 MCG/ACT inhaler, Inhale 2 puffs into the lungs 2 (two) times daily. Rinse mouth out, Disp: 1 Inhaler, Rfl: 12 .  hydrocortisone 2.5 % ointment, Apply topically 2 (two) times daily. Mid back as needed itching/dry skin, Disp: 60 g, Rfl: 0 .  losartan (COZAAR) 50 MG tablet, Take 1 tablet (50 mg total) by mouth daily., Disp: 90 tablet, Rfl: 3 .  mupirocin ointment (BACTROBAN) 2 %, Apply 1 application topically 2 (two) times daily. Mid back, Disp: 30 g, Rfl: 1 .  predniSONE (DELTASONE) 20 MG tablet, Take 2 tablets (40 mg total) by mouth daily with breakfast., Disp: 10 tablet, Rfl: 0  EXAM:  VITALS per patient if applicable:  GENERAL: alert, oriented, appears well and in no acute distress  HEENT: atraumatic, conjunttiva clear, no obvious abnormalities on inspection of external nose and ears  NECK: normal movements of the head and neck  LUNGS: on inspection no signs of respiratory distress, breathing rate appears normal, no obvious gross SOB, gasping or wheezing  CV: no obvious cyanosis  MS: moves all visible extremities without noticeable abnormality  PSYCH/NEURO: pleasant and cooperative, no obvious depression or anxiety, speech and thought processing grossly intact  ASSESSMENT AND PLAN:  Discussed the following assessment and plan:  Bronchitis - Plan: albuterol (VENTOLIN HFA) 108 (90 Base) MCG/ACT inhaler, predniSONE (DELTASONE) 20 MG tablet, budesonide-formoterol (SYMBICORT) 80-4.5 MCG/ACT inhaler -if not better refer pulm vs allergy  Cough - Plan: albuterol (VENTOLIN HFA) 108 (90 Base) MCG/ACT inhaler rec mucinex/robitussin dm   Wheezing - Plan: albuterol (VENTOLIN HFA) 108 (90 Base) MCG/ACT inhaler See above   Essential hypertension - Plan: losartan (COZAAR) 50 MG tablet d/c lis 40 mg qd    Eczema, unspecified type mid back s/p abscess I&D - Plan: mupirocin ointment (BACTROBAN) 2 %, hydrocortisone 2.5 % ointment -if not better f/u dermatology   HM Flu due  Tdap and hep Bx 3 doses utd   [facility] pap 11/06/18 neg neg HPV   mammo 09/24/18 neg insurance would not cover genetic testing for breast cancer as mom and both grandmothers dx'ed breast cancer at older age per pt -referred today  Dad h/o colon cancer ask pt at f/u age he was dx'ed as she will need colonoscopy 10 years prior to his dxdad dx'ed 37s start normal screening -referred colonoscopy age 25   -established dermatology 2  lesions behind left earappt 10/2018   -we discussed possible serious and likely etiologies, options for evaluation and workup, limitations of telemedicine visit vs in person visit, treatment, treatment risks and precautions. Pt prefers to treat via telemedicine empirically rather then risking or undertaking an in person visit at this moment. Patient agrees to seek prompt in person care if worsening, new symptoms arise, or if is not improving with treatment.   I discussed the assessment and treatment plan with the patient. The patient was provided an opportunity to ask questions and all were answered. The patient agreed with the plan and demonstrated an understanding of the instructions.   The patient was advised to call back or seek an in-person evaluation if the symptoms worsen or if the condition fails to improve as anticipated.  Time spent 25 minutes  Delorise Jackson, MD

## 2019-06-30 ENCOUNTER — Other Ambulatory Visit: Payer: Self-pay | Admitting: Internal Medicine

## 2019-06-30 ENCOUNTER — Encounter: Payer: Self-pay | Admitting: Internal Medicine

## 2019-06-30 DIAGNOSIS — J309 Allergic rhinitis, unspecified: Secondary | ICD-10-CM

## 2019-06-30 MED ORDER — MONTELUKAST SODIUM 10 MG PO TABS: 10.0000 mg | ORAL_TABLET | Freq: Every day | ORAL | 3 refills | Status: DC

## 2019-07-15 ENCOUNTER — Encounter: Payer: Self-pay | Admitting: Internal Medicine

## 2019-07-26 ENCOUNTER — Other Ambulatory Visit: Payer: Self-pay

## 2019-07-26 DIAGNOSIS — Z20822 Contact with and (suspected) exposure to covid-19: Secondary | ICD-10-CM

## 2019-07-27 LAB — NOVEL CORONAVIRUS, NAA: SARS-CoV-2, NAA: NOT DETECTED

## 2019-07-28 ENCOUNTER — Encounter: Payer: Self-pay | Admitting: *Deleted

## 2019-07-28 ENCOUNTER — Telehealth: Payer: Self-pay

## 2019-07-28 NOTE — Telephone Encounter (Signed)
Contacted patient in regards to scheduling her for her colonoscopy.  She stated that she would like to schedule in January.  I asked her to call the office back in December due to insurance changes that take effect at the end of the year.  Thanks [employer]

## 2019-09-29 ENCOUNTER — Other Ambulatory Visit: Payer: BC Managed Care – PPO

## 2019-10-06 ENCOUNTER — Ambulatory Visit: Payer: BC Managed Care – PPO | Admitting: Internal Medicine

## 2019-11-01 ENCOUNTER — Other Ambulatory Visit: Payer: Self-pay

## 2019-11-01 ENCOUNTER — Other Ambulatory Visit (INDEPENDENT_AMBULATORY_CARE_PROVIDER_SITE_OTHER): Payer: BC Managed Care – PPO

## 2019-11-01 ENCOUNTER — Encounter: Payer: Self-pay | Admitting: Internal Medicine

## 2019-11-01 DIAGNOSIS — I10 Essential (primary) hypertension: Secondary | ICD-10-CM | POA: Diagnosis not present

## 2019-11-01 DIAGNOSIS — Z1159 Encounter for screening for other viral diseases: Secondary | ICD-10-CM

## 2019-11-01 DIAGNOSIS — R739 Hyperglycemia, unspecified: Secondary | ICD-10-CM | POA: Diagnosis not present

## 2019-11-01 DIAGNOSIS — Z1329 Encounter for screening for other suspected endocrine disorder: Secondary | ICD-10-CM

## 2019-11-01 DIAGNOSIS — E559 Vitamin D deficiency, unspecified: Secondary | ICD-10-CM

## 2019-11-01 DIAGNOSIS — R7303 Prediabetes: Secondary | ICD-10-CM | POA: Insufficient documentation

## 2019-11-01 DIAGNOSIS — Z0184 Encounter for antibody response examination: Secondary | ICD-10-CM

## 2019-11-01 DIAGNOSIS — Z1389 Encounter for screening for other disorder: Secondary | ICD-10-CM

## 2019-11-01 LAB — COMPREHENSIVE METABOLIC PANEL
ALT: 21 U/L (ref 0–35)
AST: 24 U/L (ref 0–37)
Albumin: 4.1 g/dL (ref 3.5–5.2)
Alkaline Phosphatase: 66 U/L (ref 39–117)
BUN: 15 mg/dL (ref 6–23)
CO2: 27 mEq/L (ref 19–32)
Calcium: 9.2 mg/dL (ref 8.4–10.5)
Chloride: 104 mEq/L (ref 96–112)
Creatinine, Ser: 0.91 mg/dL (ref 0.40–1.20)
GFR: 80.45 mL/min (ref >60.00)
Glucose, Bld: 109 mg/dL — ABNORMAL HIGH (ref 70–99)
Potassium: 3.8 mEq/L (ref 3.5–5.1)
Sodium: 137 mEq/L (ref 135–145)
Total Bilirubin: 0.8 mg/dL (ref 0.2–1.2)
Total Protein: 6.9 g/dL (ref 6.0–8.3)

## 2019-11-01 LAB — CBC WITH DIFFERENTIAL/PLATELET
Basophils Absolute: 0.1 10*3/uL (ref 0.0–0.1)
Basophils Relative: 1.4 % (ref 0.0–3.0)
Eosinophils Absolute: 0.2 10*3/uL (ref 0.0–0.7)
Eosinophils Relative: 3.1 % (ref 0.0–5.0)
HCT: 37.3 % (ref 36.0–46.0)
Hemoglobin: 12.3 g/dL (ref 12.0–15.0)
Lymphocytes Relative: 37 % (ref 12.0–46.0)
Lymphs Abs: 2.1 10*3/uL (ref 0.7–4.0)
MCHC: 32.8 g/dL (ref 30.0–36.0)
MCV: 96.4 fl (ref 78.0–100.0)
Monocytes Absolute: 0.4 10*3/uL (ref 0.1–1.0)
Monocytes Relative: 7.5 % (ref 3.0–12.0)
Neutro Abs: 2.9 10*3/uL (ref 1.4–7.7)
Neutrophils Relative %: 51 % (ref 43.0–77.0)
Platelets: 217 10*3/uL (ref 150.0–400.0)
RBC: 3.87 Mil/uL (ref 3.87–5.11)
RDW: 14.2 % (ref 11.5–15.5)
WBC: 5.6 10*3/uL (ref 4.0–10.5)

## 2019-11-01 LAB — LIPID PANEL
Cholesterol: 165 mg/dL (ref 0–200)
HDL: 62.8 mg/dL (ref >39.00)
LDL Cholesterol: 93 mg/dL (ref 0–99)
NonHDL: 102.52
Total CHOL/HDL Ratio: 3
Triglycerides: 50 mg/dL (ref 0.0–149.0)
VLDL: 10 mg/dL (ref 0.0–40.0)

## 2019-11-01 LAB — TSH: TSH: 1.56 u[IU]/mL (ref 0.35–4.50)

## 2019-11-01 LAB — VITAMIN D 25 HYDROXY (VIT D DEFICIENCY, FRACTURES): VITD: 25.66 ng/mL — ABNORMAL LOW (ref 30.00–100.00)

## 2019-11-01 LAB — HEMOGLOBIN A1C: Hgb A1c MFr Bld: 6.1 % (ref 4.6–6.5)

## 2019-11-02 ENCOUNTER — Telehealth: Payer: Self-pay | Admitting: Gastroenterology

## 2019-11-02 LAB — HEPATITIS B SURFACE ANTIBODY, QUANTITATIVE: Hep B S AB Quant (Post): 132 m[IU]/mL (ref >=10)

## 2019-11-02 LAB — URINALYSIS, ROUTINE W REFLEX MICROSCOPIC
Bilirubin Urine: NEGATIVE
Glucose, UA: NEGATIVE
Hyaline Cast: NONE SEEN /LPF
Ketones, ur: NEGATIVE
Leukocytes,Ua: NEGATIVE
Nitrite: NEGATIVE
Protein, ur: NEGATIVE
Specific Gravity, Urine: 1.021 (ref 1.001–1.03)
pH: 6.5 (ref 5.0–8.0)

## 2019-11-02 NOTE — Telephone Encounter (Signed)
Patient called & would like to schedule a colonoscopy. She is in the work-q.

## 2019-11-02 NOTE — Telephone Encounter (Signed)
LVM returning patients call to schedule her colonoscopy.  Thanks [employer]

## 2019-11-03 ENCOUNTER — Other Ambulatory Visit: Payer: Self-pay

## 2019-11-03 ENCOUNTER — Telehealth: Payer: Self-pay

## 2019-11-03 DIAGNOSIS — Z1211 Encounter for screening for malignant neoplasm of colon: Secondary | ICD-10-CM

## 2019-11-03 NOTE — Telephone Encounter (Signed)
Gastroenterology Pre-Procedure Review  Request Date: Monday 12/03/19 Requesting Physician: Dr. Allen Norris  PATIENT REVIEW QUESTIONS: The patient responded to the following health history questions as indicated:    1. Are you having any GI issues? no 2. Do you have a personal history of Polyps? no 3. Do you have a family history of Colon Cancer or Polyps? no 4. Diabetes Mellitus? no 5. Joint replacements in the past 12 months?IUD removed 2020 6. Major health problems in the past 3 months?no 7. 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 no Aspirin? no  Patient confirms/reports the following medications:  Current Outpatient Medications  Medication Sig Dispense Refill  . albuterol (VENTOLIN HFA) 108 (90 Base) MCG/ACT inhaler Inhale 1-2 puffs into the lungs every 6 (six) hours as needed for wheezing or shortness of breath. 18 g 12  . aspirin EC 81 MG tablet Take by mouth.    . budesonide-formoterol (SYMBICORT) 80-4.5 MCG/ACT inhaler Inhale 2 puffs into the lungs 2 (two) times daily. Rinse mouth out 1 Inhaler 12  . carvedilol (COREG) 12.5 MG tablet Take 12.5 mg by mouth 2 (two) times daily with a meal.   11  . Cholecalciferol 125 MCG (5000 UT) TABS Take 1 tablet by mouth daily.    . Cholecalciferol 50000 units capsule Take 1 capsule (50,000 Units total) by mouth once a week. 13 capsule 1  . fluticasone (FLONASE) 50 MCG/ACT nasal spray Place 2 sprays into both nostrils daily. 16 g 11  . furosemide (LASIX) 20 MG tablet Take 20 mg by mouth daily.  11  . hydrocortisone 2.5 % ointment Apply topically 2 (two) times daily. Mid back as needed itching/dry skin 60 g 0  . loratadine (CLARITIN) 10 MG tablet Take 1 tablet (10 mg total) by mouth daily. (Patient taking  differently: Take 10 mg by mouth daily as needed. ) 90 tablet 1  . losartan (COZAAR) 50 MG tablet Take 1 tablet (50 mg total) by mouth daily. 90 tablet 3  . meloxicam (MOBIC) 15 MG tablet     . montelukast (SINGULAIR) 10 MG tablet Take 1 tablet (10 mg total) by mouth at bedtime. 90 tablet 3  . mupirocin ointment (BACTROBAN) 2 % Apply 1 application topically 2 (two) times daily. Mid back 30 g 1  . predniSONE (DELTASONE) 20 MG tablet Take 2 tablets (40 mg total) by mouth daily with breakfast. 10 tablet 0  . Probiotic Product (PROBIOTIC-10) CAPS Take 1 capsule by mouth daily.     . valACYclovir (VALTREX) 1000 MG tablet Take 1 tablet (1,000 mg total) by mouth 2 (two) times daily. X 5 to 10 days as needed 60 tablet 11   No current facility-administered medications for this visit.    Patient confirms/reports the following allergies:  Allergies  Allergen Reactions  . Penicillin V Rash    No orders of the defined types were placed in this encounter.   AUTHORIZATION INFORMATION Primary Insurance: 1D#: Group #:  Secondary Insurance: 1D#: Group #:  SCHEDULE INFORMATION: Date: 12/03/19 Time: Location:[facility]

## 2019-11-05 ENCOUNTER — Encounter: Payer: Self-pay | Admitting: Internal Medicine

## 2019-11-05 ENCOUNTER — Ambulatory Visit (INDEPENDENT_AMBULATORY_CARE_PROVIDER_SITE_OTHER): Payer: BC Managed Care – PPO | Admitting: Internal Medicine

## 2019-11-05 ENCOUNTER — Other Ambulatory Visit: Payer: Self-pay

## 2019-11-05 VITALS — Ht 66.0 in | Wt 264.0 lb

## 2019-11-05 DIAGNOSIS — Z0000 Encounter for general adult medical examination without abnormal findings: Secondary | ICD-10-CM

## 2019-11-05 DIAGNOSIS — N920 Excessive and frequent menstruation with regular cycle: Secondary | ICD-10-CM

## 2019-11-05 DIAGNOSIS — I10 Essential (primary) hypertension: Secondary | ICD-10-CM

## 2019-11-05 DIAGNOSIS — E559 Vitamin D deficiency, unspecified: Secondary | ICD-10-CM | POA: Diagnosis not present

## 2019-11-05 DIAGNOSIS — R7303 Prediabetes: Secondary | ICD-10-CM

## 2019-11-05 NOTE — Patient Instructions (Signed)
Prediabetes Eating Plan Prediabetes is a condition that causes blood sugar (glucose) levels to be higher than normal. This increases the risk for developing diabetes. In order to prevent diabetes from developing, your health care provider may recommend a diet and other lifestyle changes to help you:  Control your blood glucose levels.  Improve your cholesterol levels.  Manage your blood pressure. Your health care provider may recommend working with a diet and nutrition specialist (dietitian) to make a meal plan that is best for you. What are tips for following this plan? Lifestyle  Set weight loss goals with the help of your health care team. It is recommended that most people with prediabetes lose 7% of their current body weight.  Exercise for at least 30 minutes at least 5 days a week.  Attend a support group or seek ongoing support from a mental health counselor.  Take over-the-counter and prescription medicines only as told by your health care provider. Reading food labels  Read food labels to check the amount of fat, salt (sodium), and sugar in prepackaged foods. Avoid foods that have: ? Saturated fats. ? Trans fats. ? Added sugars.  Avoid foods that have more than 300 milligrams (mg) of sodium per serving. Limit your daily sodium intake to less than 2,300 mg each day. Shopping  Avoid buying pre-made and processed foods. Cooking  Cook with olive oil. Do not use butter, lard, or ghee.  Bake, broil, grill, or boil foods. Avoid frying. Meal planning   Work with your dietitian to develop an eating plan that is right for you. This may include: ? Tracking how many calories you take in. Use a food diary, notebook, or mobile application to track what you eat at each meal. ? Using the glycemic index (GI) to plan your meals. The index tells you how quickly a food will raise your blood glucose. Choose low-GI foods. These foods take a longer time to raise blood glucose.  Consider  following a Mediterranean diet. This diet includes: ? Several servings each day of fresh fruits and vegetables. ? Eating fish at least twice a week. ? Several servings each day of whole grains, beans, nuts, and seeds. ? Using olive oil instead of other fats. ? Moderate alcohol consumption. ? Eating small amounts of red meat and whole-fat dairy.  If you have high blood pressure, you may need to limit your sodium intake or follow a diet such as the DASH eating plan. DASH is an eating plan that aims to lower high blood pressure. What foods are recommended? The items listed below may not be a complete list. Talk with your dietitian about what dietary choices are best for you. Grains Whole grains, such as whole-wheat or whole-grain breads, crackers, cereals, and pasta. Unsweetened oatmeal. Bulgur. Barley. Quinoa. Brown rice. Corn or whole-wheat flour tortillas or taco shells. Vegetables Lettuce. Spinach. Peas. Beets. Cauliflower. Cabbage. Broccoli. Carrots. Tomatoes. Squash. Eggplant. Herbs. Peppers. Onions. Cucumbers. Brussels sprouts. Fruits Berries. Bananas. Apples. Oranges. Grapes. Papaya. Mango. Pomegranate. Kiwi. Grapefruit. Cherries. Meats and other protein foods Seafood. Poultry without skin. Lean cuts of pork and beef. Tofu. Eggs. Nuts. Beans. Dairy Low-fat or fat-free dairy products, such as yogurt, cottage cheese, and cheese. Beverages Water. Tea. Coffee. Sugar-free or diet soda. Seltzer water. Lowfat or no-fat milk. Milk alternatives, such as soy or almond milk. Fats and oils Olive oil. Canola oil. Sunflower oil. Grapeseed oil. Avocado. Walnuts. Sweets and desserts Sugar-free or low-fat pudding. Sugar-free or low-fat ice cream and other frozen treats.   Seasoning and other foods Herbs. Sodium-free spices. Mustard. Relish. Low-fat, low-sugar ketchup. Low-fat, low-sugar barbecue sauce. Low-fat or fat-free mayonnaise. What foods are not recommended? The items listed below may not be a  complete list. Talk with your dietitian about what dietary choices are best for you. Grains Refined white flour and flour products, such as bread, pasta, snack foods, and cereals. Vegetables Canned vegetables. Frozen vegetables with butter or cream sauce. Fruits Fruits canned with syrup. Meats and other protein foods Fatty cuts of meat. Poultry with skin. Breaded or fried meat. Processed meats. Dairy Full-fat yogurt, cheese, or milk. Beverages Sweetened drinks, such as sweet iced tea and soda. Fats and oils Butter. Lard. Ghee. Sweets and desserts Baked goods, such as cake, cupcakes, pastries, cookies, and cheesecake. Seasoning and other foods Spice mixes with added salt. Ketchup. Barbecue sauce. Mayonnaise. Summary  To prevent diabetes from developing, you may need to make diet and other lifestyle changes to help control blood sugar, improve cholesterol levels, and manage your blood pressure.  Set weight loss goals with the help of your health care team. It is recommended that most people with prediabetes lose 7 percent of their current body weight.  Consider following a Mediterranean diet that includes plenty of fresh fruits and vegetables, whole grains, beans, nuts, seeds, fish, lean meat, low-fat dairy, and healthy oils. This information is not intended to replace advice given to you by your health care provider. Make sure you discuss any questions you have with your health care provider. Document Revised: 01/22/2019 Document Reviewed: 12/04/2016 Elsevier Patient Education  Learned.  Budget-Friendly Healthy Eating There are many ways to save money at the grocery store and continue to eat healthy. You can be successful if you:  Plan meals according to your budget.  Make a grocery list and only purchase food according to your grocery list.  Prepare food yourself. What are tips for following this plan?  Reading food labels  Compare food labels between brand name  foods and the store brand. Often the nutritional value is the same, but the store brand is lower cost.  Look for products that do not have added sugar, fat, or salt (sodium). These often cost the same but are healthier for you. Products may be labeled as: ? Sugar-free. ? Nonfat. ? Low-fat. ? Sodium-free. ? Low-sodium.  Look for lean ground beef labeled as at least 92% lean and 8% fat. Shopping  Buy only the items on your grocery list and go only to the areas of the store that have the items on your list.  Use coupons only for foods and brands you normally buy. Avoid buying items you wouldn't normally buy simply because they are on sale.  Check online and in newspapers for weekly deals.  Buy healthy items from the bulk bins when available, such as herbs, spices, flour, pasta, nuts, and dried fruit.  Buy fruits and vegetables that are in season. Prices are usually lower on in-season produce.  Look at the unit price on the price tag. Use it to compare different brands and sizes to find out which item is the best deal.  Choose healthy items that are often low-cost, such as carrots, potatoes, apples, bananas, and oranges. Dried or canned beans are a low-cost protein source.  Buy in bulk and freeze extra food. Items you can buy in bulk include meats, fish, poultry, frozen fruits, and frozen vegetables.  Avoid buying "ready-to-eat" foods, such as pre-cut fruits and vegetables and pre-made salads.  If possible, shop around to discover where you can find the best prices. Consider other retailers such as dollar stores, larger [employer], local fruit and vegetable stands, and farmers markets.  Do not shop when you are hungry. If you shop while hungry, it may be hard to stick to your list and budget.  Resist impulse buying. Use your grocery list as your official plan for the week.  Buy a variety of vegetables and fruits by purchasing fresh, frozen, and canned items.  Look at the top  and bottom shelves for deals. Foods at eye level (eye level of an adult or child) are usually more expensive.  Be efficient with your time when shopping. The more time you spend at the store, the more money you are likely to spend.  To save money when choosing more expensive foods like meats and dairy: ? Choose cheaper cuts of meat, such as bone-in chicken thighs and drumsticks instead of skinless and boneless chicken. When you are ready to prepare the chicken, you can remove the skin yourself to make it healthier. ? Choose lean meats like chicken or [place] instead of beef. ? Choose canned seafood, such as tuna, salmon, or sardines. ? Buy eggs as a low-cost source of protein. ? Buy dried beans and peas, such as lentils, split peas, or kidney beans instead of meats. Dried beans and peas are a good alternative source of protein. ? Buy the larger tubs of yogurt instead of individual-sized containers.  Choose water instead of sodas and other sweetened beverages.  Avoid buying chips, cookies, and other "junk food." These items are usually expensive and not healthy. Cooking  Make extra food and freeze the extras in meal-sized containers or in individual portions for fast meals and snacks.  Pre-cook on days when you have extra time to prepare meals in advance. You can keep these meals in the fridge or freezer and reheat for a quick meal.  When you come home from the grocery store, wash, peel, and cut fruits and vegetables so they are ready to use and eat. This will help reduce food waste. Meal planning  Do not eat out or get fast food. Prepare food at home.  Make a grocery list and make sure to bring it with you to the store. If you have a smart phone, you could use your phone to create your shopping list.  Plan meals and snacks according to a grocery list and budget you create.  Use leftovers in your meal plan for the week.  Look for recipes where you can cook once and make enough food for  two meals.  Include budget-friendly meals like stews, casseroles, and stir-fry dishes.  Try some meatless meals or try "no cook" meals like salads.  Make sure that half your plate is filled with fruits or vegetables. Choose from fresh, frozen, or canned fruits and vegetables. If eating canned, remember to rinse them before eating. This will remove any excess salt added for packaging. Summary  Eating healthy on a budget is possible if you plan your meals according to your budget, purchase according to your budget and grocery list, and prepare food yourself.  Tips for buying more food on a limited budget include buying generic brands, using coupons only for foods you normally buy, and buying healthy items from the bulk bins when available.  Tips for buying cheaper food to replace expensive food include choosing cheaper, lean cuts of meat, and buying dried beans and peas. This information is  not intended to replace advice given to you by your health care provider. Make sure you discuss any questions you have with your health care provider. Document Revised: 10/01/2017 Document Reviewed: 10/01/2017 Elsevier Patient Education  [place].

## 2019-11-05 NOTE — Progress Notes (Signed)
Virtual Visit via Video Note  I connected with Heather Case  on 11/05/19 at  8:10 AM EST by a video enabled telemedicine application and verified that I am speaking with the correct person using two identifiers.  Location patient: home Location provider:work or home office Persons participating in the virtual visit: patient, provider  I discussed the limitations of evaluation and management by telemedicine and the availability of in person appointments. The patient expressed understanding and agreed to proceed.   HPI: Annual doing well  1. HTN on losartan 50 mg qd and lasix 20 mg qd prn BP 132/86 06/2019  2. Prediabetes with labs just started exercising 2 months ago eating chips and crackers at times  3. Vitamin d def 25.66 on D3 1000 mg qd will increase also takes MVT  3. Hematuria was on last day of cycle IUD removed and reinserted last year with heavy menses bleeding through pads and tampons needs to f/u with ob/gyn    ROS: See pertinent positives and negatives per HPI. General: wt stable  Heent: nl hearing  Cv: no chest pain  Lungs: no sob GI: no GI blood  GU: +heavy menses  MSK: no jt pain  Neuro: nl memory Psych: normal mood  Skin: doing better   Past Medical History:  Diagnosis Date  . Allergic rhinitis, seasonal   . Cardiomyopathy ([place])    post partum  . History of CHF (congestive heart failure)   . Hordeolum externum of left eye   . HSV-2 infection   . HTN, goal below 140/90   . Left ear pain   . Obesity, Class III, BMI 40-49.9 (morbid obesity) ([place])   . Vitamin D deficiency     Past Surgical History:  Procedure Laterality Date  . BREAST BIOPSY Right 1994   Negative  . BREAST LUMPECTOMY     benign (at age 31yr)  . CESAREAN SECTION     x's 2  . COLONOSCOPY  12/2008   Dr. Kenton Kingfisher, follow-up ASCUS  . HYSTEROSCOPY N/A 11/13/2018   Procedure: HYSTEROSCOPY, remove/ replace Mirena;  Surgeon: Ward, Honor Loh, MD;  Location: [facility] ORS;  Service: Gynecology;  Laterality:  N/A;    Family History  Problem Relation Age of Onset  . Cancer Mother        breast dx'ed in 52s   . Breast cancer Mother 29  . Hypertension Father   . Diabetes Father   . Cancer Father        colon cancer   . Cancer Maternal Grandmother        breast dx later in life   . Breast cancer Maternal Grandmother   . Breast cancer Paternal Grandmother   . 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  albuterol (VENTOLIN HFA) 108 (90 Base) MCG/ACT inhaler, Inhale 1-2 puffs into the lungs every 6 (six) hours as needed for wheezing or shortness of breath., Disp: 18 g, Rfl: 12 .  budesonide-formoterol (SYMBICORT) 80-4.5 MCG/ACT inhaler, Inhale 2 puffs into the lungs 2 (two) times daily. Rinse mouth out, Disp: 1 Inhaler, Rfl: 12 .  carvedilol (COREG) 12.5 MG tablet, Take 12.5 mg by mouth 2 (two) times daily with a meal. , Disp: , Rfl: 11 .  Cholecalciferol 125 MCG (5000 UT) TABS, Take 1 tablet by mouth daily., Disp: , Rfl:  .  Cholecalciferol  50000 units capsule, Take 1 capsule (50,000 Units total) by mouth once a week., Disp: 13 capsule, Rfl: 1 .  fluticasone (FLONASE) 50 MCG/ACT nasal spray, Place 2 sprays into both nostrils daily., Disp: 16 g, Rfl: 11 .  furosemide (LASIX) 20 MG tablet, Take 20 mg by mouth daily., Disp: , Rfl: 11 .  hydrocortisone 2.5 % ointment, Apply topically 2 (two) times daily. Mid back as needed itching/dry skin, Disp: 60 g, Rfl: 0 .  loratadine (CLARITIN) 10 MG tablet, Take 1 tablet (10 mg total) by mouth daily. (Patient taking differently: Take 10 mg by mouth daily as needed. ), Disp: 90 tablet, Rfl: 1 .  losartan (COZAAR) 50 MG tablet, Take 1 tablet (50 mg total) by mouth daily., Disp: 90 tablet, Rfl: 3 .  meloxicam (MOBIC) 15 MG tablet, , Disp: , Rfl:  .  montelukast (SINGULAIR) 10 MG tablet, Take 1 tablet (10 mg total) by mouth at bedtime., Disp: 90  tablet, Rfl: 3 .  mupirocin ointment (BACTROBAN) 2 %, Apply 1 application topically 2 (two) times daily. Mid back, Disp: 30 g, Rfl: 1 .  predniSONE (DELTASONE) 20 MG tablet, Take 2 tablets (40 mg total) by mouth daily with breakfast., Disp: 10 tablet, Rfl: 0 .  Probiotic Product (PROBIOTIC-10) CAPS, Take 1 capsule by mouth daily. , Disp: , Rfl:  .  valACYclovir (VALTREX) 1000 MG tablet, Take 1 tablet (1,000 mg total) by mouth 2 (two) times daily. X 5 to 10 days as needed, Disp: 60 tablet, Rfl: 11  EXAM:  VITALS per patient if applicable:  GENERAL: alert, oriented, appears well and in no acute distress  HEENT: atraumatic, conjunttiva clear, no obvious abnormalities on inspection of external nose and ears  NECK: normal movements of the head and neck  LUNGS: on inspection no signs of respiratory distress, breathing rate appears normal, no obvious gross SOB, gasping or wheezing  CV: no obvious cyanosis  MS: moves all visible extremities without noticeable abnormality  PSYCH/NEURO: pleasant and cooperative, no obvious depression or anxiety, speech and thought processing grossly intact  ASSESSMENT AND PLAN:  Discussed the following assessment and plan:  Annual physical exam Flu had 07/2019 at work  Tdap and hep Bx 3 doses utd, hep B immune  [facility] pap 11/06/18 neg neg HPV   mammo 09/24/18 neg insurance would not cover genetic testing for breast cancer as mom and both grandmothers dx'ed breast cancer at older age per pt -referred today sch 11/15/19   Dad h/o colon cancer ask pt at f/u age he was dx'ed as she will need colonoscopy 10 years prior to his dxdad dx'ed 62s start normal screening -referred colonoscopy age 62 having 12/03/19   -established dermatology 2 lesions behind left earappt 10/2018, has not seen  Essential hypertension - Plan: Lipid panel, Comprehensive metabolic panel, CBC with Differential/Platelet Monitor BP and consider inc losartan to 100 mg qd    Vitamin D deficiency - Plan: VITAMIN D 25 Hydroxy (Vit-D Deficiency, Fractures) rec D3 4000 to 5000 IU qd   Prediabetes - Plan: Hemoglobin A1c Healthy diet and exercise     -we discussed possible serious and likely etiologies, options for evaluation and workup, limitations of telemedicine visit vs in person visit, treatment, treatment risks and precautions. Pt prefers to treat via telemedicine empirically rather then risking or undertaking an in person visit at this moment. Patient agrees to seek prompt in person care if worsening, new symptoms arise, or if is not improving with treatment.   I  discussed the assessment and treatment plan with the patient. The patient was provided an opportunity to ask questions and all were answered. The patient agreed with the plan and demonstrated an understanding of the instructions.   The patient was advised to call back or seek an in-person evaluation if the symptoms worsen or if the condition fails to improve as anticipated.  Time spent 20 minutes  Delorise Jackson, MD

## 2019-11-15 ENCOUNTER — Ambulatory Visit
Admission: RE | Admit: 2019-11-15 | Discharge: 2019-11-15 | Disposition: A | Discharge status: Home / Self Care | Payer: BC Managed Care – PPO | Source: Ambulatory Visit | Attending: Internal Medicine | Admitting: Internal Medicine

## 2019-11-15 DIAGNOSIS — Z1231 Encounter for screening mammogram for malignant neoplasm of breast: Secondary | ICD-10-CM

## 2019-11-26 ENCOUNTER — Telehealth: Payer: Self-pay | Admitting: Gastroenterology

## 2019-11-26 NOTE — Telephone Encounter (Signed)
Patient called to cancel her colonoscopy on 12-03-2019 with Dr Allen Norris

## 2019-11-30 NOTE — Telephone Encounter (Signed)
Colonoscopy canceled with Dr. Allen Norris for 12/03/19 per pts request.  Telephone message.  Thanks,  [employer], [place]

## 2019-12-03 ENCOUNTER — Encounter: Admission: RE | Payer: Self-pay | Source: Home / Self Care

## 2019-12-03 ENCOUNTER — Ambulatory Visit
Admission: RE | Admit: 2019-12-03 | Payer: BC Managed Care – PPO | Source: Home / Self Care | Admitting: Gastroenterology

## 2019-12-03 SURGERY — COLONOSCOPY WITH PROPOFOL
Anesthesia: General

## 2020-05-03 ENCOUNTER — Other Ambulatory Visit: Payer: BC Managed Care – PPO

## 2020-05-03 ENCOUNTER — Other Ambulatory Visit: Payer: Self-pay

## 2020-05-04 ENCOUNTER — Other Ambulatory Visit (INDEPENDENT_AMBULATORY_CARE_PROVIDER_SITE_OTHER): Payer: BC Managed Care – PPO

## 2020-05-04 DIAGNOSIS — E559 Vitamin D deficiency, unspecified: Secondary | ICD-10-CM | POA: Diagnosis not present

## 2020-05-04 DIAGNOSIS — I10 Essential (primary) hypertension: Secondary | ICD-10-CM

## 2020-05-04 DIAGNOSIS — R7303 Prediabetes: Secondary | ICD-10-CM | POA: Diagnosis not present

## 2020-05-04 LAB — CBC WITH DIFFERENTIAL/PLATELET
Basophils Absolute: 0.2 10*3/uL — ABNORMAL HIGH (ref 0.0–0.1)
Basophils Relative: 2.7 % (ref 0.0–3.0)
Eosinophils Absolute: 0.2 10*3/uL (ref 0.0–0.7)
Eosinophils Relative: 1.9 % (ref 0.0–5.0)
HCT: 39.5 % (ref 36.0–46.0)
Hemoglobin: 13.2 g/dL (ref 12.0–15.0)
Lymphocytes Relative: 28.6 % (ref 12.0–46.0)
Lymphs Abs: 2.4 10*3/uL (ref 0.7–4.0)
MCHC: 33.4 g/dL (ref 30.0–36.0)
MCV: 95.9 fl (ref 78.0–100.0)
Monocytes Absolute: 0.7 10*3/uL (ref 0.1–1.0)
Monocytes Relative: 8.4 % (ref 3.0–12.0)
Neutro Abs: 4.9 10*3/uL (ref 1.4–7.7)
Neutrophils Relative %: 58.4 % (ref 43.0–77.0)
Platelets: 223 10*3/uL (ref 150.0–400.0)
RBC: 4.12 Mil/uL (ref 3.87–5.11)
RDW: 14.6 % (ref 11.5–15.5)
WBC: 8.4 10*3/uL (ref 4.0–10.5)

## 2020-05-04 LAB — COMPREHENSIVE METABOLIC PANEL
ALT: 18 U/L (ref 0–35)
AST: 19 U/L (ref 0–37)
Albumin: 4.1 g/dL (ref 3.5–5.2)
Alkaline Phosphatase: 67 U/L (ref 39–117)
BUN: 9 mg/dL (ref 6–23)
CO2: 27 mEq/L (ref 19–32)
Calcium: 9 mg/dL (ref 8.4–10.5)
Chloride: 105 mEq/L (ref 96–112)
Creatinine, Ser: 0.82 mg/dL (ref 0.40–1.20)
GFR: 90.52 mL/min (ref >60.00)
Glucose, Bld: 105 mg/dL — ABNORMAL HIGH (ref 70–99)
Potassium: 3.9 mEq/L (ref 3.5–5.1)
Sodium: 137 mEq/L (ref 135–145)
Total Bilirubin: 0.9 mg/dL (ref 0.2–1.2)
Total Protein: 6.5 g/dL (ref 6.0–8.3)

## 2020-05-04 LAB — LIPID PANEL
Cholesterol: 160 mg/dL (ref 0–200)
HDL: 63 mg/dL (ref >39.00)
LDL Cholesterol: 87 mg/dL (ref 0–99)
NonHDL: 97.37
Total CHOL/HDL Ratio: 3
Triglycerides: 51 mg/dL (ref 0.0–149.0)
VLDL: 10.2 mg/dL (ref 0.0–40.0)

## 2020-05-04 LAB — VITAMIN D 25 HYDROXY (VIT D DEFICIENCY, FRACTURES): VITD: 29.9 ng/mL — ABNORMAL LOW (ref 30.00–100.00)

## 2020-05-04 LAB — HEMOGLOBIN A1C: Hgb A1c MFr Bld: 6.2 % (ref 4.6–6.5)

## 2020-05-09 ENCOUNTER — Ambulatory Visit (INDEPENDENT_AMBULATORY_CARE_PROVIDER_SITE_OTHER): Payer: BC Managed Care – PPO | Admitting: Internal Medicine

## 2020-05-09 ENCOUNTER — Ambulatory Visit: Payer: BC Managed Care – PPO | Admitting: Internal Medicine

## 2020-05-09 ENCOUNTER — Other Ambulatory Visit: Payer: Self-pay

## 2020-05-09 ENCOUNTER — Encounter: Payer: Self-pay | Admitting: Internal Medicine

## 2020-05-09 VITALS — BP 130/88 | HR 86 | Temp 98.3°F | Ht 66.0 in | Wt 298.0 lb

## 2020-05-09 DIAGNOSIS — I10 Essential (primary) hypertension: Secondary | ICD-10-CM

## 2020-05-09 DIAGNOSIS — Z6841 Body Mass Index (BMI) 40.0 and over, adult: Secondary | ICD-10-CM

## 2020-05-09 DIAGNOSIS — Z1211 Encounter for screening for malignant neoplasm of colon: Secondary | ICD-10-CM

## 2020-05-09 DIAGNOSIS — E559 Vitamin D deficiency, unspecified: Secondary | ICD-10-CM

## 2020-05-09 DIAGNOSIS — J40 Bronchitis, not specified as acute or chronic: Secondary | ICD-10-CM

## 2020-05-09 DIAGNOSIS — J309 Allergic rhinitis, unspecified: Secondary | ICD-10-CM

## 2020-05-09 DIAGNOSIS — Z1389 Encounter for screening for other disorder: Secondary | ICD-10-CM

## 2020-05-09 DIAGNOSIS — R062 Wheezing: Secondary | ICD-10-CM

## 2020-05-09 DIAGNOSIS — R05 Cough: Secondary | ICD-10-CM

## 2020-05-09 DIAGNOSIS — Z1329 Encounter for screening for other suspected endocrine disorder: Secondary | ICD-10-CM

## 2020-05-09 DIAGNOSIS — R7303 Prediabetes: Secondary | ICD-10-CM

## 2020-05-09 DIAGNOSIS — R059 Cough, unspecified: Secondary | ICD-10-CM

## 2020-05-09 MED ORDER — LOSARTAN POTASSIUM 100 MG PO TABS: 100.0000 mg | ORAL_TABLET | Freq: Every day | ORAL | 3 refills | Status: DC

## 2020-05-09 MED ORDER — FUROSEMIDE 20 MG PO TABS: 20.0000 mg | ORAL_TABLET | Freq: Every day | ORAL | 11 refills | Status: DC

## 2020-05-09 MED ORDER — BUDESONIDE-FORMOTEROL FUMARATE 80-4.5 MCG/ACT IN AERO: 2.0000 | INHALATION_SPRAY | Freq: Two times a day (BID) | RESPIRATORY_TRACT | 12 refills | Status: DC

## 2020-05-09 MED ORDER — MONTELUKAST SODIUM 10 MG PO TABS: 10.0000 mg | ORAL_TABLET | Freq: Every day | ORAL | 3 refills | Status: DC

## 2020-05-09 MED ORDER — LORATADINE 10 MG PO TABS: 10.0000 mg | ORAL_TABLET | Freq: Every day | ORAL | 3 refills | Status: DC | PRN

## 2020-05-09 MED ORDER — ALBUTEROL SULFATE HFA 108 (90 BASE) MCG/ACT IN AERS: 1.0000 | INHALATION_SPRAY | Freq: Four times a day (QID) | RESPIRATORY_TRACT | 12 refills | Status: DC | PRN

## 2020-05-09 MED ORDER — CARVEDILOL 12.5 MG PO TABS: 12.5000 mg | ORAL_TABLET | Freq: Two times a day (BID) | ORAL | 3 refills | Status: DC

## 2020-05-09 NOTE — Progress Notes (Signed)
Patient flagged: Current status:  PATIENT IS OVERDUE FOR BMI FOLLOW UP PLAN BMI is estimated to be 48.1 based on the last recorded weight and height

## 2020-05-09 NOTE — Patient Instructions (Addendum)
Vitamin D3 5000 IU day with 1/7 days 10K IU if needed     Budget-Friendly Healthy Eating There are many ways to save money at the grocery store and continue to eat healthy. You can be successful if you:  Plan meals according to your budget.  Make a grocery list and only purchase food according to your grocery list.  Prepare food yourself. What are tips for following this plan?  Reading food labels  Compare food labels between brand name foods and the store brand. Often the nutritional value is the same, but the store brand is lower cost.  Look for products that do not have added sugar, fat, or salt (sodium). These often cost the same but are healthier for you. Products may be labeled as: ? Sugar-free. ? Nonfat. ? Low-fat. ? Sodium-free. ? Low-sodium.  Look for lean ground beef labeled as at least 92% lean and 8% fat. Shopping  Buy only the items on your grocery list and go only to the areas of the store that have the items on your list.  Use coupons only for foods and brands you normally buy. Avoid buying items you wouldn't normally buy simply because they are on sale.  Check online and in newspapers for weekly deals.  Buy healthy items from the bulk bins when available, such as herbs, spices, flour, pasta, nuts, and dried fruit.  Buy fruits and vegetables that are in season. Prices are usually lower on in-season produce.  Look at the unit price on the price tag. Use it to compare different brands and sizes to find out which item is the best deal.  Choose healthy items that are often low-cost, such as carrots, potatoes, apples, bananas, and oranges. Dried or canned beans are a low-cost protein source.  Buy in bulk and freeze extra food. Items you can buy in bulk include meats, fish, poultry, frozen fruits, and frozen vegetables.  Avoid buying "ready-to-eat" foods, such as pre-cut fruits and vegetables and pre-made salads.  If possible, shop around to discover where you  can find the best prices. Consider other retailers such as dollar stores, larger [employer], local fruit and vegetable stands, and farmers markets.  Do not shop when you are hungry. If you shop while hungry, it may be hard to stick to your list and budget.  Resist impulse buying. Use your grocery list as your official plan for the week.  Buy a variety of vegetables and fruits by purchasing fresh, frozen, and canned items.  Look at the top and bottom shelves for deals. Foods at eye level (eye level of an adult or child) are usually more expensive.  Be efficient with your time when shopping. The more time you spend at the store, the more money you are likely to spend.  To save money when choosing more expensive foods like meats and dairy: ? Choose cheaper cuts of meat, such as bone-in chicken thighs and drumsticks instead of skinless and boneless chicken. When you are ready to prepare the chicken, you can remove the skin yourself to make it healthier. ? Choose lean meats like chicken or [place] instead of beef. ? Choose canned seafood, such as tuna, salmon, or sardines. ? Buy eggs as a low-cost source of protein. ? Buy dried beans and peas, such as lentils, split peas, or kidney beans instead of meats. Dried beans and peas are a good alternative source of protein. ? Buy the larger tubs of yogurt instead of individual-sized containers.  Choose water instead  of sodas and other sweetened beverages.  Avoid buying chips, cookies, and other "junk food." These items are usually expensive and not healthy. Cooking  Make extra food and freeze the extras in meal-sized containers or in individual portions for fast meals and snacks.  Pre-cook on days when you have extra time to prepare meals in advance. You can keep these meals in the fridge or freezer and reheat for a quick meal.  When you come home from the grocery store, wash, peel, and cut fruits and vegetables so they are ready to use and  eat. This will help reduce food waste. Meal planning  Do not eat out or get fast food. Prepare food at home.  Make a grocery list and make sure to bring it with you to the store. If you have a smart phone, you could use your phone to create your shopping list.  Plan meals and snacks according to a grocery list and budget you create.  Use leftovers in your meal plan for the week.  Look for recipes where you can cook once and make enough food for two meals.  Include budget-friendly meals like stews, casseroles, and stir-fry dishes.  Try some meatless meals or try "no cook" meals like salads.  Make sure that half your plate is filled with fruits or vegetables. Choose from fresh, frozen, or canned fruits and vegetables. If eating canned, remember to rinse them before eating. This will remove any excess salt added for packaging. Summary  Eating healthy on a budget is possible if you plan your meals according to your budget, purchase according to your budget and grocery list, and prepare food yourself.  Tips for buying more food on a limited budget include buying generic brands, using coupons only for foods you normally buy, and buying healthy items from the bulk bins when available.  Tips for buying cheaper food to replace expensive food include choosing cheaper, lean cuts of meat, and buying dried beans and peas. This information is not intended to replace advice given to you by your health care provider. Make sure you discuss any questions you have with your health care provider. Document Revised: 10/01/2017 Document Reviewed: 10/01/2017 Elsevier Patient Education  2020 [employer].  Prediabetes Eating Plan Prediabetes is a condition that causes blood sugar (glucose) levels to be higher than normal. This increases the risk for developing diabetes. In order to prevent diabetes from developing, your health care provider may recommend a diet and other lifestyle changes to help  you:  Control your blood glucose levels.  Improve your cholesterol levels.  Manage your blood pressure. Your health care provider may recommend working with a diet and nutrition specialist (dietitian) to make a meal plan that is best for you. What are tips for following this plan? Lifestyle  Set weight loss goals with the help of your health care team. It is recommended that most people with prediabetes lose 7% of their current body weight.  Exercise for at least 30 minutes at least 5 days a week.  Attend a support group or seek ongoing support from a mental health counselor.  Take over-the-counter and prescription medicines only as told by your health care provider. Reading food labels  Read food labels to check the amount of fat, salt (sodium), and sugar in prepackaged foods. Avoid foods that have: ? Saturated fats. ? Trans fats. ? Added sugars.  Avoid foods that have more than 300 milligrams (mg) of sodium per serving. Limit your daily sodium intake to  less than 2,300 mg each day. Shopping  Avoid buying pre-made and processed foods. Cooking  Cook with olive oil. Do not use butter, lard, or ghee.  Bake, broil, grill, or boil foods. Avoid frying. Meal planning   Work with your dietitian to develop an eating plan that is right for you. This may include: ? Tracking how many calories you take in. Use a food diary, notebook, or mobile application to track what you eat at each meal. ? Using the glycemic index (GI) to plan your meals. The index tells you how quickly a food will raise your blood glucose. Choose low-GI foods. These foods take a longer time to raise blood glucose.  Consider following a Mediterranean diet. This diet includes: ? Several servings each day of fresh fruits and vegetables. ? Eating fish at least twice a week. ? Several servings each day of whole grains, beans, nuts, and seeds. ? Using olive oil instead of other fats. ? Moderate alcohol  consumption. ? Eating small amounts of red meat and whole-fat dairy.  If you have high blood pressure, you may need to limit your sodium intake or follow a diet such as the DASH eating plan. DASH is an eating plan that aims to lower high blood pressure. What foods are recommended? The items listed below may not be a complete list. Talk with your dietitian about what dietary choices are best for you. Grains Whole grains, such as whole-wheat or whole-grain breads, crackers, cereals, and pasta. Unsweetened oatmeal. Bulgur. Barley. Quinoa. Brown rice. Corn or whole-wheat flour tortillas or taco shells. Vegetables Lettuce. Spinach. Peas. Beets. Cauliflower. Cabbage. Broccoli. Carrots. Tomatoes. Squash. Eggplant. Herbs. Peppers. Onions. Cucumbers. Brussels sprouts. Fruits Berries. Bananas. Apples. Oranges. Grapes. Papaya. Mango. Pomegranate. Kiwi. Grapefruit. Cherries. Meats and other protein foods Seafood. Poultry without skin. Lean cuts of pork and beef. Tofu. Eggs. Nuts. Beans. Dairy Low-fat or fat-free dairy products, such as yogurt, cottage cheese, and cheese. Beverages Water. Tea. Coffee. Sugar-free or diet soda. Seltzer water. Lowfat or no-fat milk. Milk alternatives, such as soy or almond milk. Fats and oils Olive oil. Canola oil. Sunflower oil. Grapeseed oil. Avocado. Walnuts. Sweets and desserts Sugar-free or low-fat pudding. Sugar-free or low-fat ice cream and other frozen treats. Seasoning and other foods Herbs. Sodium-free spices. Mustard. Relish. Low-fat, low-sugar ketchup. Low-fat, low-sugar barbecue sauce. Low-fat or fat-free mayonnaise. What foods are not recommended? The items listed below may not be a complete list. Talk with your dietitian about what dietary choices are best for you. Grains Refined white flour and flour products, such as bread, pasta, snack foods, and cereals. Vegetables Canned vegetables. Frozen vegetables with butter or cream sauce. Fruits Fruits canned  with syrup. Meats and other protein foods Fatty cuts of meat. Poultry with skin. Breaded or fried meat. Processed meats. Dairy Full-fat yogurt, cheese, or milk. Beverages Sweetened drinks, such as sweet iced tea and soda. Fats and oils Butter. Lard. Ghee. Sweets and desserts Baked goods, such as cake, cupcakes, pastries, cookies, and cheesecake. Seasoning and other foods Spice mixes with added salt. Ketchup. Barbecue sauce. Mayonnaise. Summary  To prevent diabetes from developing, you may need to make diet and other lifestyle changes to help control blood sugar, improve cholesterol levels, and manage your blood pressure.  Set weight loss goals with the help of your health care team. It is recommended that most people with prediabetes lose 7 percent of their current body weight.  Consider following a Mediterranean diet that includes plenty of fresh fruits and vegetables,  whole grains, beans, nuts, seeds, fish, lean meat, low-fat dairy, and healthy oils. This information is not intended to replace advice given to you by your health care provider. Make sure you discuss any questions you have with your health care provider. Document Revised: 01/22/2019 Document Reviewed: 12/04/2016 Elsevier Patient Education  2020 [employer].

## 2020-05-09 NOTE — Progress Notes (Signed)
Chief Complaint  Patient presents with  . Follow-up   F/u  1. HTN BP sl elevated today on coreg 12.5 mg bid, lasix 20 mg qd, losartan 50 mg qd  2. Prediabetes boxing 2x per week and walking some 3. Vit D def on D3 4000 IU qd  Review of Systems  Constitutional: Negative for weight loss.  HENT: Negative for hearing loss.   Eyes: Negative for blurred vision.  Respiratory: Negative for shortness of breath.   Cardiovascular: Negative for chest pain.  Gastrointestinal: Negative for abdominal pain.  Musculoskeletal: Negative for falls.  Skin: Negative for rash.  Neurological: Negative for headaches.  Psychiatric/Behavioral: Negative for depression.   Past Medical History:  Diagnosis Date  . Allergic rhinitis, seasonal   . Cardiomyopathy ([place])    post partum  . History of CHF (congestive heart failure)   . Hordeolum externum of left eye   . HSV-2 infection   . HTN, goal below 140/90   . Left ear pain   . Obesity, Class III, BMI 40-49.9 (morbid obesity) ([place])   . Vitamin D deficiency    Past Surgical History:  Procedure Laterality Date  . BREAST EXCISIONAL BIOPSY Right 1994   Negative  . CESAREAN SECTION     x's 2  . COLONOSCOPY  12/2008   Dr. Kenton Kingfisher, follow-up ASCUS  . HYSTEROSCOPY N/A 11/13/2018   Procedure: HYSTEROSCOPY, remove/ replace Mirena;  Surgeon: Ward, Honor Loh, MD;  Location: [facility] ORS;  Service: Gynecology;  Laterality: N/A;   Family History  Problem Relation Age of Onset  . Cancer Mother        breast dx'ed in 8s   . Breast cancer Mother 28  . Hypertension Father   . Diabetes Father   . Cancer Father        colon cancer   . Cancer Maternal Grandmother        breast dx later in life   . Breast cancer Maternal Grandmother   . Breast cancer Paternal Grandmother   . Cancer Paternal Grandmother        breast cancer dx'ed later in life    Social History   Socioeconomic History  . Marital status: Unknown    Spouse name: Not on file  . Number of children:  Not on file  . Years of education: Not on file  . Highest education level: Not on file  Occupational History  . Occupation: buyer for [employer]  Tobacco Use  . Smoking status: Former Smoker    Packs/day: 0.25    Types: Cigarettes    Quit date: 11/13/2008    Years since quitting: 11.4  . Smokeless tobacco: Never Used  . Tobacco comment: quit in 2010. Used to smoke 1/2 PPD.  Vaping Use  . Vaping Use: Never used  Substance and Sexual Activity  . Alcohol use: Yes    Alcohol/week: 0.0 standard drinks    Comment: occasional  . Drug use: No  . Sexual activity: Yes    Partners: Male    Birth control/protection: Condom, I.U.D.  Other Topics Concern  . 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 Difficulty of Paying Living Expenses:   Food Insecurity:   . Worried About Charity fundraiser in the Last Year:   . Arboriculturist in the Last Year:   Transportation Needs:   . Lack of Transportation (  Medical):   Marland Kitchen Lack of Transportation (Non-Medical):   Physical Activity:   . Days of Exercise per Week:   . Minutes of Exercise per Session:   Stress:   . Feeling of Stress :   Social Connections:   . Frequency of Communication with Friends and Family:   . Frequency of Social Gatherings with Friends and Family:   . Attends Religious Services:   . Active Member of Clubs or Organizations:   . Attends Archivist Meetings:   Marland Kitchen Marital Status:   Intimate Partner Violence:   . Fear of Current or Ex-Partner:   . Emotionally Abused:   Marland Kitchen Physically Abused:   . Sexually Abused:    Current Meds  Medication Sig  . albuterol (VENTOLIN HFA) 108 (90 Base) MCG/ACT inhaler Inhale 1-2 puffs into the lungs every 6 (six) hours as needed for wheezing or shortness of breath.  . budesonide-formoterol (SYMBICORT) 80-4.5 MCG/ACT inhaler Inhale 2 puffs into the lungs 2 (two) times daily. Rinse mouth out  .  carvedilol (COREG) 12.5 MG tablet Take 1 tablet (12.5 mg total) by mouth 2 (two) times daily with a meal.  . Cholecalciferol 125 MCG (5000 UT) TABS Take 1 tablet by mouth daily.  . Cholecalciferol 50000 units capsule Take 1 capsule (50,000 Units total) by mouth once a week.  . fluticasone (FLONASE) 50 MCG/ACT nasal spray Place 2 sprays into both nostrils daily.  . furosemide (LASIX) 20 MG tablet Take 1 tablet (20 mg total) by mouth daily.  Marland Kitchen loratadine (CLARITIN) 10 MG tablet Take 1 tablet (10 mg total) by mouth daily as needed.  Marland Kitchen losartan (COZAAR) 100 MG tablet Take 1 tablet (100 mg total) by mouth daily.  . montelukast (SINGULAIR) 10 MG tablet Take 1 tablet (10 mg total) by mouth at bedtime.  . Probiotic Product (PROBIOTIC-10) CAPS Take 1 capsule by mouth daily.   . valACYclovir (VALTREX) 1000 MG tablet Take 1 tablet (1,000 mg total) by mouth 2 (two) times daily. X 5 to 10 days as needed  . [DISCONTINUED] albuterol (VENTOLIN HFA) 108 (90 Base) MCG/ACT inhaler Inhale 1-2 puffs into the lungs every 6 (six) hours as needed for wheezing or shortness of breath.  . [DISCONTINUED] budesonide-formoterol (SYMBICORT) 80-4.5 MCG/ACT inhaler Inhale 2 puffs into the lungs 2 (two) times daily. Rinse mouth out  . [DISCONTINUED] carvedilol (COREG) 12.5 MG tablet Take 12.5 mg by mouth 2 (two) times daily with a meal.   . [DISCONTINUED] furosemide (LASIX) 20 MG tablet Take 20 mg by mouth daily.  . [DISCONTINUED] loratadine (CLARITIN) 10 MG tablet Take 1 tablet (10 mg total) by mouth daily. (Patient taking differently: Take 10 mg by mouth daily as needed. )  . [DISCONTINUED] losartan (COZAAR) 50 MG tablet Take 1 tablet (50 mg total) by mouth daily.  . [DISCONTINUED] montelukast (SINGULAIR) 10 MG tablet Take 1 tablet (10 mg total) by mouth at bedtime.   Allergies  Allergen Reactions  . Penicillin V Rash   Recent Results (from the past 2160 hour(s))  VITAMIN D 25 Hydroxy (Vit-D Deficiency, Fractures)      Status: Abnormal   Collection Time: 05/04/20  9:14 AM  Result Value Ref Range   VITD 29.90 (L) 30.00 - 100.00 ng/mL  Hemoglobin A1c     Status: None   Collection Time: 05/04/20  9:14 AM  Result Value Ref Range   Hgb A1c MFr Bld 6.2 4.6 - 6.5 %    Comment: Glycemic Control Guidelines for People with Diabetes:Non  Diabetic:  <6%Goal of Therapy: <7%Additional Action Suggested:  >8%   CBC with Differential/Platelet     Status: Abnormal   Collection Time: 05/04/20  9:14 AM  Result Value Ref Range   WBC 8.4 4.0 - 10.5 K/uL   RBC 4.12 3.87 - 5.11 Mil/uL   Hemoglobin 13.2 12.0 - 15.0 g/dL   HCT 39.5 36 - 46 %   MCV 95.9 78.0 - 100.0 fl   MCHC 33.4 30.0 - 36.0 g/dL   RDW 14.6 11.5 - 15.5 %   Platelets 223.0 150 - 400 K/uL   Neutrophils Relative % 58.4 43 - 77 %   Lymphocytes Relative 28.6 12 - 46 %   Monocytes Relative 8.4 3 - 12 %   Eosinophils Relative 1.9 0 - 5 %   Basophils Relative 2.7 0 - 3 %   Neutro Abs 4.9 1.4 - 7.7 K/uL   Lymphs Abs 2.4 0.7 - 4.0 K/uL   Monocytes Absolute 0.7 0 - 1 K/uL   Eosinophils Absolute 0.2 0 - 0 K/uL   Basophils Absolute 0.2 (H) 0 - 0 K/uL  Comprehensive metabolic panel     Status: Abnormal   Collection Time: 05/04/20  9:14 AM  Result Value Ref Range   Sodium 137 135 - 145 mEq/L   Potassium 3.9 3.5 - 5.1 mEq/L   Chloride 105 96 - 112 mEq/L   CO2 27 19 - 32 mEq/L   Glucose, Bld 105 (H) 70 - 99 mg/dL   BUN 9 6 - 23 mg/dL   Creatinine, Ser 0.82 0.40 - 1.20 mg/dL   Total Bilirubin 0.9 0.2 - 1.2 mg/dL   Alkaline Phosphatase 67 39 - 117 U/L   AST 19 0 - 37 U/L   ALT 18 0 - 35 U/L   Total Protein 6.5 6.0 - 8.3 g/dL   Albumin 4.1 3.5 - 5.2 g/dL   GFR 90.52 >60.00 mL/min   Calcium 9.0 8.4 - 10.5 mg/dL  Lipid panel     Status: None   Collection Time: 05/04/20  9:14 AM  Result Value Ref Range   Cholesterol 160 0 - 200 mg/dL    Comment: ATP III Classification       Desirable:  < 200 mg/dL               Borderline High:  200 - 239 mg/dL          High:  >  = 240 mg/dL   Triglycerides 51.0 0 - 149 mg/dL    Comment: Normal:  <150 mg/dLBorderline High:  150 - 199 mg/dL   HDL 63.00 >39.00 mg/dL   VLDL 10.2 0.0 - 40.0 mg/dL   LDL Cholesterol 87 0 - 99 mg/dL   Total CHOL/HDL Ratio 3     Comment:                Men          Women1/2 Average Risk     3.4          3.3Average Risk          5.0          4.42X Average Risk          9.6          7.13X Average Risk          15.0          11.0  NonHDL 97.37     Comment: NOTE:  Non-HDL goal should be 30 mg/dL higher than patient's LDL goal (i.e. LDL goal of < 70 mg/dL, would have non-HDL goal of < 100 mg/dL)   Objective  Body mass index is 48.1 kg/m. Wt Readings from Last 3 Encounters:  05/09/20 (!) 298 lb (135.2 kg)  11/05/19 264 lb (119.7 kg)  06/29/19 276 lb (125.2 kg)   Temp Readings from Last 3 Encounters:  05/09/20 98.3 F (36.8 C) (Oral)  11/13/18 (!) 97 F (36.1 C) (Temporal)  11/10/18 98.6 F (37 C) (Oral)   BP Readings from Last 3 Encounters:  05/09/20 (!) 130/88  11/13/18 (!) 156/92  11/10/18 130/72   Pulse Readings from Last 3 Encounters:  05/09/20 86  11/13/18 (!) 53  11/10/18 70    Physical Exam Vitals and nursing note reviewed.  Constitutional:      Appearance: Normal appearance. She is well-developed and well-groomed. She is morbidly obese.  HENT:     Head: Normocephalic and atraumatic.  Eyes:     Conjunctiva/sclera: Conjunctivae normal.     Pupils: Pupils are equal, round, and reactive to light.  Cardiovascular:     Rate and Rhythm: Normal rate and regular rhythm.     Heart sounds: Normal heart sounds. No murmur heard.   Pulmonary:     Effort: Pulmonary effort is normal.     Breath sounds: Normal breath sounds.  Skin:    General: Skin is warm and dry.  Neurological:     General: No focal deficit present.     Mental Status: She is alert and oriented to person, place, and time. Mental status is at baseline.     Gait: Gait normal.   Psychiatric:        Attention and Perception: Attention and perception normal.        Mood and Affect: Mood and affect normal.        Speech: Speech normal.        Behavior: Behavior normal. Behavior is cooperative.        Thought Content: Thought content normal.        Cognition and Memory: Cognition and memory normal.        Judgment: Judgment normal.     Assessment  Plan  Essential hypertension - Plan: losartan (COZAAR) 100 MG tablet, Comprehensive metabolic panel, Lipid panel, CBC with Differential/Platelet  Vitamin D deficiency - Plan: Vitamin D (25 hydroxy) rec D3 5000 IU qd and 1x per week 10K IU   Prediabetes, morbid obesity - Plan: Hemoglobin A1c rec healthy diet and exercise   Allergic rhinitis, unspecified seasonality, unspecified trigger - Plan: montelukast (SINGULAIR) 10 MG tablet  Bronchitis, chronic - Plan: budesonide-formoterol (SYMBICORT) 80-4.5 MCG/ACT inhaler, albuterol (VENTOLIN HFA) 108 (90 Base) MCG/ACT inhaler  HM Fluhad 07/2019 at work  Tdap and hep Bx 3 doses utd, hep B immune covid 2/2 pfizer   [facility]ap 11/06/18 neg neg HPV  mammo 12/12/19neg insurance would not cover genetic testing for breast cancer as mom and both grandmothers dx'ed breast cancer at older age per pt -mammo neg 11/15/19   Dad h/o colon cancer ask pt at f/u age he was dx'ed as she will need colonoscopy 10 years prior to his dxdad dx'ed 49s start normal screening -referred colonoscopy age 34having 12/03/19  -referred leb IG  -establisheddermatology 2 lesions behind left earappt 10/2018, has not seen    Provider: Dr. Olivia Mackie McLean-Scocuzza-Internal Medicine

## 2020-05-12 ENCOUNTER — Telehealth: Payer: Self-pay | Admitting: Internal Medicine

## 2020-05-12 NOTE — Telephone Encounter (Signed)
Faxed request for refill or new prescription ot [facility] for Losartan Potassium 50 mg tab

## 2020-11-06 ENCOUNTER — Other Ambulatory Visit (INDEPENDENT_AMBULATORY_CARE_PROVIDER_SITE_OTHER): Payer: BC Managed Care – PPO

## 2020-11-06 ENCOUNTER — Other Ambulatory Visit: Payer: Self-pay

## 2020-11-06 ENCOUNTER — Other Ambulatory Visit: Payer: Self-pay | Admitting: Internal Medicine

## 2020-11-06 DIAGNOSIS — R7303 Prediabetes: Secondary | ICD-10-CM

## 2020-11-06 DIAGNOSIS — I10 Essential (primary) hypertension: Secondary | ICD-10-CM

## 2020-11-06 DIAGNOSIS — E559 Vitamin D deficiency, unspecified: Secondary | ICD-10-CM | POA: Diagnosis not present

## 2020-11-06 DIAGNOSIS — Z1389 Encounter for screening for other disorder: Secondary | ICD-10-CM

## 2020-11-06 DIAGNOSIS — Z1329 Encounter for screening for other suspected endocrine disorder: Secondary | ICD-10-CM | POA: Diagnosis not present

## 2020-11-06 DIAGNOSIS — Z1231 Encounter for screening mammogram for malignant neoplasm of breast: Secondary | ICD-10-CM

## 2020-11-06 LAB — LIPID PANEL
Cholesterol: 170 mg/dL (ref 0–200)
HDL: 66.3 mg/dL (ref >39.00)
LDL Cholesterol: 91 mg/dL (ref 0–99)
NonHDL: 103.93
Total CHOL/HDL Ratio: 3
Triglycerides: 63 mg/dL (ref 0.0–149.0)
VLDL: 12.6 mg/dL (ref 0.0–40.0)

## 2020-11-06 LAB — COMPREHENSIVE METABOLIC PANEL
ALT: 39 U/L — ABNORMAL HIGH (ref 0–35)
AST: 36 U/L (ref 0–37)
Albumin: 4.4 g/dL (ref 3.5–5.2)
Alkaline Phosphatase: 60 U/L (ref 39–117)
BUN: 10 mg/dL (ref 6–23)
CO2: 27 mEq/L (ref 19–32)
Calcium: 9.4 mg/dL (ref 8.4–10.5)
Chloride: 103 mEq/L (ref 96–112)
Creatinine, Ser: 0.95 mg/dL (ref 0.40–1.20)
GFR: 71.48 mL/min (ref >60.00)
Glucose, Bld: 99 mg/dL (ref 70–99)
Potassium: 4.2 mEq/L (ref 3.5–5.1)
Sodium: 139 mEq/L (ref 135–145)
Total Bilirubin: 0.8 mg/dL (ref 0.2–1.2)
Total Protein: 7 g/dL (ref 6.0–8.3)

## 2020-11-06 LAB — CBC WITH DIFFERENTIAL/PLATELET
Basophils Absolute: 0.1 10*3/uL (ref 0.0–0.1)
Basophils Relative: 0.9 % (ref 0.0–3.0)
Eosinophils Absolute: 0.3 10*3/uL (ref 0.0–0.7)
Eosinophils Relative: 4.2 % (ref 0.0–5.0)
HCT: 37.9 % (ref 36.0–46.0)
Hemoglobin: 12.4 g/dL (ref 12.0–15.0)
Lymphocytes Relative: 34.9 % (ref 12.0–46.0)
Lymphs Abs: 2.1 10*3/uL (ref 0.7–4.0)
MCHC: 32.8 g/dL (ref 30.0–36.0)
MCV: 95.6 fl (ref 78.0–100.0)
Monocytes Absolute: 0.5 10*3/uL (ref 0.1–1.0)
Monocytes Relative: 8.5 % (ref 3.0–12.0)
Neutro Abs: 3.1 10*3/uL (ref 1.4–7.7)
Neutrophils Relative %: 51.5 % (ref 43.0–77.0)
Platelets: 265 10*3/uL (ref 150.0–400.0)
RBC: 3.96 Mil/uL (ref 3.87–5.11)
RDW: 14.1 % (ref 11.5–15.5)
WBC: 6.1 10*3/uL (ref 4.0–10.5)

## 2020-11-06 LAB — VITAMIN D 25 HYDROXY (VIT D DEFICIENCY, FRACTURES): VITD: 32.26 ng/mL (ref 30.00–100.00)

## 2020-11-06 LAB — HEMOGLOBIN A1C: Hgb A1c MFr Bld: 6.1 % (ref 4.6–6.5)

## 2020-11-06 LAB — TSH: TSH: 2.26 u[IU]/mL (ref 0.35–4.50)

## 2020-11-06 NOTE — Addendum Note (Signed)
Addended by: Leeanne Rio on: 11/06/2020 08:33 AM   Modules accepted: Orders

## 2020-11-09 ENCOUNTER — Ambulatory Visit (INDEPENDENT_AMBULATORY_CARE_PROVIDER_SITE_OTHER): Payer: BC Managed Care – PPO | Admitting: Internal Medicine

## 2020-11-09 ENCOUNTER — Encounter: Payer: Self-pay | Admitting: Internal Medicine

## 2020-11-09 ENCOUNTER — Other Ambulatory Visit: Payer: Self-pay

## 2020-11-09 VITALS — BP 130/80 | HR 69 | Temp 98.0°F | Ht 66.0 in | Wt 291.0 lb

## 2020-11-09 DIAGNOSIS — Z0000 Encounter for general adult medical examination without abnormal findings: Secondary | ICD-10-CM | POA: Diagnosis not present

## 2020-11-09 DIAGNOSIS — Z23 Encounter for immunization: Secondary | ICD-10-CM

## 2020-11-09 DIAGNOSIS — Z1211 Encounter for screening for malignant neoplasm of colon: Secondary | ICD-10-CM

## 2020-11-09 DIAGNOSIS — R748 Abnormal levels of other serum enzymes: Secondary | ICD-10-CM

## 2020-11-09 DIAGNOSIS — N920 Excessive and frequent menstruation with regular cycle: Secondary | ICD-10-CM | POA: Diagnosis not present

## 2020-11-09 NOTE — Progress Notes (Signed)
Chief Complaint  Patient presents with  . Follow-up  . Immunizations   Annual  1. htn controlled coreg 12.5 mg bid lasix 20 mg qd losartan 100 mg qd  2. Menorrhagia f/u Dr. Leonides Schanz considering ablation vs hysterectomy will consider start with ablation  3rd IUD came out with blood clots 09/2020 and saw ob/gyn 10/25/20 to disc options  Review of Systems  Constitutional: Negative for weight loss.  HENT: Negative for hearing loss.   Eyes: Negative for blurred vision.  Respiratory: Negative for shortness of breath.   Cardiovascular: Negative for chest pain and leg swelling.  Gastrointestinal: Negative for abdominal pain.  Musculoskeletal: Negative for falls.  Skin: Negative for rash.  Neurological: Negative for headaches.  Psychiatric/Behavioral: Negative for depression.   Past Medical History:  Diagnosis Date  . Allergic rhinitis, seasonal   . Cardiomyopathy ([place])    post partum  . History of CHF (congestive heart failure)   . Hordeolum externum of left eye   . HSV-2 infection   . HTN, goal below 140/90   . Left ear pain   . Obesity, Class III, BMI 40-49.9 (morbid obesity) ([place])   . Vitamin D deficiency    Past Surgical History:  Procedure Laterality Date  . BREAST EXCISIONAL BIOPSY Right 1994   Negative  . CESAREAN SECTION     x's 2  . COLONOSCOPY  12/2008   Dr. Kenton Kingfisher, follow-up ASCUS  . HYSTEROSCOPY N/A 11/13/2018   Procedure: HYSTEROSCOPY, remove/ replace Mirena;  Surgeon: Ward, Honor Loh, MD;  Location: [facility] ORS;  Service: Gynecology;  Laterality: N/A;   Family History  Problem Relation Age of Onset  . Cancer Mother        breast dx'ed in 74s   . Breast cancer Mother 62  . Hypertension Father   . Diabetes Father   . Cancer Father        prostate cancer   . Cancer Maternal Grandmother        breast dx later in life   . Breast cancer Maternal Grandmother   . Breast cancer Paternal Grandmother   . Cancer Paternal Grandmother        breast cancer dx'ed later in life     Social History   Socioeconomic History  . Marital status: Unknown    Spouse name: Not on file  . Number of children: Not on file  . Years of education: Not on file  . Highest education level: Not on file  Occupational History  . 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 Smoking status: Former Smoker    Packs/day: 0.25    Types: Cigarettes    Quit date: 11/13/2008    Years since quitting: 11.9  . Smokeless tobacco: Never Used  . Tobacco comment: quit in 2010. Used to smoke 1/2 PPD.  Vaping Use  . Vaping Use: Never used  Substance and Sexual Activity  . Alcohol use: Yes    Alcohol/week: 0.0 standard drinks    Comment: occasional  . Drug use: No  . Sexual activity: Yes    Partners: Male    Birth control/protection: Condom, I.U.D.  Other Topics Concern  . 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 albuterol (VENTOLIN HFA) 108 (90 Base) MCG/ACT inhaler Inhale 1-2 puffs into the lungs every 6 (six) hours as needed for wheezing or shortness of breath.  . budesonide-formoterol (SYMBICORT) 80-4.5 MCG/ACT inhaler Inhale 2 puffs into the lungs 2 (two) times daily. Rinse mouth out  . carvedilol (COREG) 12.5 MG tablet Take 1 tablet (12.5 mg total) by mouth 2 (two) times daily with a meal.  . Cholecalciferol 125 MCG (5000 UT) TABS Take 1 tablet by mouth daily.  . fluticasone (FLONASE) 50 MCG/ACT nasal spray Place 2 sprays into both nostrils daily.  . furosemide (LASIX) 20 MG tablet Take 1 tablet (20 mg total) by mouth daily.  Marland Kitchen loratadine (CLARITIN) 10 MG tablet Take 1 tablet (10 mg total) by mouth daily as needed.  Marland Kitchen losartan (COZAAR) 100 MG tablet Take  1 tablet (100 mg total) by mouth daily.  . montelukast (SINGULAIR) 10 MG tablet Take 1 tablet (10 mg total) by mouth at bedtime.  . Probiotic Product (PROBIOTIC-10) CAPS Take 1 capsule by mouth daily.    Allergies  Allergen Reactions  . Penicillin V Rash   Recent Results (from the past 2160 hour(s))  Vitamin D (25 hydroxy)     Status: None   Collection Time: 11/06/20  8:18 AM  Result Value Ref Range   VITD 32.26 30.00 - 100.00 ng/mL  TSH     Status: None   Collection Time: 11/06/20  8:18 AM  Result Value Ref Range   TSH 2.26 0.35 - 4.50 uIU/mL  Hemoglobin A1c     Status: None   Collection Time: 11/06/20  8:18 AM  Result Value Ref Range   Hgb A1c MFr Bld 6.1 4.6 - 6.5 %    Comment: Glycemic Control Guidelines for People with Diabetes:Non Diabetic:  <6%Goal of Therapy: <7%Additional Action Suggested:  >8%   CBC with Differential/Platelet     Status: None   Collection Time: 11/06/20  8:18 AM  Result Value Ref Range   WBC 6.1 4.0 - 10.5 K/uL   RBC 3.96 3.87 - 5.11 Mil/uL   Hemoglobin 12.4 12.0 - 15.0 g/dL   HCT 37.9 36.0 - 46.0 %   MCV 95.6 78.0 - 100.0 fl   MCHC 32.8 30.0 - 36.0 g/dL   RDW 14.1 11.5 - 15.5 %   Platelets 265.0 150.0 - 400.0 K/uL   Neutrophils Relative % 51.5 43.0 - 77.0 %   Lymphocytes Relative 34.9 12.0 - 46.0 %   Monocytes Relative 8.5 3.0 - 12.0 %   Eosinophils Relative 4.2 0.0 - 5.0 %   Basophils Relative 0.9 0.0 - 3.0 %   Neutro Abs 3.1 1.4 - 7.7 K/uL   Lymphs Abs 2.1 0.7 - 4.0 K/uL   Monocytes Absolute 0.5 0.1 - 1.0 K/uL   Eosinophils Absolute 0.3 0.0 - 0.7 K/uL   Basophils Absolute 0.1 0.0 - 0.1 K/uL  Lipid panel     Status: None   Collection Time: 11/06/20  8:18 AM  Result Value Ref Range   Cholesterol 170 0 - 200 mg/dL    Comment: ATP III Classification       Desirable:  < 200 mg/dL               Borderline High:  200 - 239 mg/dL          High:  > = 240 mg/dL   Triglycerides 63.0 0.0 - 149.0 mg/dL  Comment: Normal:  <150 mg/dLBorderline High:   150 - 199 mg/dL   HDL 66.30 >39.00 mg/dL   VLDL 12.6 0.0 - 40.0 mg/dL   LDL Cholesterol 91 0 - 99 mg/dL   Total CHOL/HDL Ratio 3     Comment:                Men          Women1/2 Average Risk     3.4          3.3Average Risk          5.0          4.42X Average Risk          9.6          7.13X Average Risk          15.0          11.0                       NonHDL 103.93     Comment: NOTE:  Non-HDL goal should be 30 mg/dL higher than patient's LDL goal (i.e. LDL goal of < 70 mg/dL, would have non-HDL goal of < 100 mg/dL)  Comprehensive metabolic panel     Status: Abnormal   Collection Time: 11/06/20  8:18 AM  Result Value Ref Range   Sodium 139 135 - 145 mEq/L   Potassium 4.2 3.5 - 5.1 mEq/L   Chloride 103 96 - 112 mEq/L   CO2 27 19 - 32 mEq/L   Glucose, Bld 99 70 - 99 mg/dL   BUN 10 6 - 23 mg/dL   Creatinine, Ser 0.95 0.40 - 1.20 mg/dL   Total Bilirubin 0.8 0.2 - 1.2 mg/dL   Alkaline Phosphatase 60 39 - 117 U/L   AST 36 0 - 37 U/L   ALT 39 (H) 0 - 35 U/L   Total Protein 7.0 6.0 - 8.3 g/dL   Albumin 4.4 3.5 - 5.2 g/dL   GFR 71.48 >60.00 mL/min    Comment: Calculated using the CKD-EPI Creatinine Equation (2021)   Calcium 9.4 8.4 - 10.5 mg/dL   Objective  Body mass index is 46.97 kg/m. Wt Readings from Last 3 Encounters:  11/09/20 291 lb (132 kg)  05/09/20 (!) 298 lb (135.2 kg)  11/05/19 264 lb (119.7 kg)   Temp Readings from Last 3 Encounters:  11/09/20 98 F (36.7 C) (Oral)  05/09/20 98.3 F (36.8 C) (Oral)  11/13/18 (!) 97 F (36.1 C) (Temporal)   BP Readings from Last 3 Encounters:  11/09/20 130/80  05/09/20 (!) 130/88  11/13/18 (!) 156/92   Pulse Readings from Last 3 Encounters:  11/09/20 69  05/09/20 86  11/13/18 (!) 53    Physical Exam Vitals and nursing note reviewed.  Constitutional:      Appearance: Normal appearance. She is well-developed and well-groomed. She is morbidly obese.  HENT:     Head: Normocephalic and atraumatic.  Eyes:      Conjunctiva/sclera: Conjunctivae normal.     Pupils: Pupils are equal, round, and reactive to light.  Cardiovascular:     Rate and Rhythm: Normal rate and regular rhythm.     Heart sounds: Normal heart sounds. No murmur heard.   Pulmonary:     Effort: Pulmonary effort is normal.     Breath sounds: Normal breath sounds.  Skin:    General: Skin is warm and dry.  Neurological:     General: No focal  deficit present.     Mental Status: She is alert and oriented to person, place, and time. Mental status is at baseline.     Gait: Gait normal.  Psychiatric:        Attention and Perception: Attention and perception normal.        Mood and Affect: Mood and affect normal.        Speech: Speech normal.        Behavior: Behavior normal. Behavior is cooperative.        Thought Content: Thought content normal.        Cognition and Memory: Cognition and memory normal.        Judgment: Judgment normal.     Assessment  Plan  Annual physical exam Needs flu shot - Plan: Flu Vaccine QUAD 6+ mos PF IM (Fluarix Quad PF) Fluhad given today  Tdap and hep Bx 3 doses utd, hep B immune covid 3/3  pfizer   [facility]Npap 11/06/18 neg neg HPV May do ablation if fails hysterectomy as of 11/09/20   mammo 12/12/19neg insurance would not cover genetic testing for breast cancer as mom and both grandmothers dx'ed breast cancer at older age per pt -mammo neg 2/1/21sch 11/27/20   Dad h/o prostate cancer -referred colonoscopy age 59having 12/03/19 -referred leb IG  -establisheddermatology 2 lesions behind left earappt 10/2018, has not seen  cards 08/2021 dr. Clayborn Bigness   rec healthy diet and exercise   Menorrhagia with regular cycle Disc ablation to try and if fails hysterectomy Dr. Leonides Schanz   Elevated liver enzymes - Plan: Hepatic function 1 month consider [place] in future    Provider: Dr. Olivia Mackie McLean-Scocuzza-Internal Medicine

## 2020-11-09 NOTE — Patient Instructions (Signed)
Colonoscopy, Adult A colonoscopy is a procedure to look at the entire large intestine. This procedure is done using a long, thin, flexible tube that has a camera on the end. You may have a colonoscopy:  As a part of normal colorectal screening.  If you have certain symptoms, such as: ? A low number of red blood cells in your blood (anemia). ? Diarrhea that does not go away. ? Pain in your abdomen. ? Blood in your stool. A colonoscopy can help screen for and diagnose medical problems, including:  Tumors.  Extra tissue that grows where mucus forms (polyps).  Inflammation.  Areas of bleeding. Tell your health care provider about:  Any allergies you have.  All medicines you are taking, including vitamins, herbs, eye drops, creams, and over-the-counter medicines.  Any problems you or family members have had with anesthetic medicines.  Any blood disorders you have.  Any surgeries you have had.  Any medical conditions you have.  Any problems you have had with having bowel movements.  Whether you are pregnant or may be pregnant. What are the risks? Generally, this is a safe procedure. However, problems may occur, including:  Bleeding.  Damage to your intestine.  Allergic reactions to medicines given during the procedure.  Infection. This is rare. What happens before the procedure? Eating and drinking restrictions Follow instructions from your health care provider about eating or drinking restrictions, which may include:  A few days before the procedure: ? Follow a low-fiber diet. ? Avoid nuts, seeds, dried fruit, raw fruits, and vegetables.  1-3 days before the procedure: ? Eat only gelatin dessert or ice pops. ? Drink only clear liquids, such as water, clear juice, clear broth or bouillon, black coffee or tea, or clear soft drinks or sports drinks. ? Avoid liquids that contain red or purple dye.  The day of the procedure: ? Do not eat solid foods. You may  continue to drink clear liquids until up to 2 hours before the procedure. ? Do not eat or drink anything starting 2 hours before the procedure, or within the time period that your health care provider recommends. Bowel prep If you were prescribed a bowel prep to take by mouth (orally) to clean out your colon:  Take it as told by your health care provider. Starting the day before your procedure, you will need to drink a large amount of liquid medicine. The liquid will cause you to have many bowel movements of loose stool until your stool becomes almost clear or light green.  If your skin or the opening between the buttocks (anus) gets irritated from diarrhea, you may relieve the irritation using: ? Wipes with medicine in them, such as adult wet wipes with aloe and vitamin E. ? A product to soothe skin, such as petroleum jelly.  If you vomit while drinking the bowel prep: ? Take a break for up to 60 minutes. ? Begin the bowel prep again. ? Call your health care provider if you keep vomiting or you cannot take the bowel prep without vomiting.  To clean out your colon, you may also be given: ? Laxative medicines. These help you have a bowel movement. ? Instructions for enema use. An enema is liquid medicine injected into your rectum. Medicines Ask your health care provider about:  Changing or stopping your regular medicines or supplements. This is especially important if you are taking iron supplements, diabetes medicines, or blood thinners.  Taking medicines such as aspirin and ibuprofen. These medicines  can thin your blood. Do not take these medicines unless your health care provider tells you to take them.  Taking over-the-counter medicines, vitamins, herbs, and supplements. General instructions  Ask your health care provider what steps will be taken to help prevent infection. These may include washing skin with a germ-killing soap.  Plan to have someone take you home from the hospital  or clinic. What happens during the procedure?  An IV will be inserted into one of your veins.  You may be given one or more of the following: ? A medicine to help you relax (sedative). ? A medicine to numb the area (local anesthetic). ? A medicine to make you fall asleep (general anesthetic). This is rarely needed.  You will lie on your side with your knees bent.  The tube will: ? Have oil or gel put on it (be lubricated). ? Be inserted into your anus. ? Be gently eased through all parts of your large intestine.  Air will be sent into your colon to keep it open. This may cause some pressure or cramping.  Images will be taken with the camera and will appear on a screen.  A small tissue sample may be removed to be looked at under a microscope (biopsy). The tissue may be sent to a lab for testing if any signs of problems are found.  If small polyps are found, they may be removed and checked for cancer cells.  When the procedure is finished, the tube will be removed. The procedure may vary among health care providers and hospitals.   What happens after the procedure?  Your blood pressure, heart rate, breathing rate, and blood oxygen level will be monitored until you leave the hospital or clinic.  You may have a small amount of blood in your stool.  You may pass gas and have mild cramping or bloating in your abdomen. This is caused by the air that was used to open your colon during the exam.  Do not drive for 24 hours after the procedure.  It is up to you to get the results of your procedure. Ask your health care provider, or the department that is doing the procedure, when your results will be ready. Summary  A colonoscopy is a procedure to look at the entire large intestine.  Follow instructions from your health care provider about eating and drinking before the procedure.  If you were prescribed an oral bowel prep to clean out your colon, take it as told by your health care  provider.  During the colonoscopy, a flexible tube with a camera on its end is inserted into the anus and then passed into the other parts of the large intestine. This information is not intended to replace advice given to you by your health care provider. Make sure you discuss any questions you have with your health care provider. Document Revised: 04/23/2019 Document Reviewed: 04/23/2019 Elsevier Patient Education  2021 [place].  Endometrial Ablation Endometrial ablation is a procedure that destroys the thin inner layer of the lining of the uterus (endometrium). This procedure may be done:  To stop heavy menstrual periods.  To stop bleeding that is causing anemia.  To control irregular bleeding.  To treat bleeding caused by small tumors (fibroids) in the endometrium. This procedure is often done as an alternative to major surgery, such as removal of the uterus and cervix (hysterectomy). As a result of this procedure:  You may not be able to have children. However, if  you have not yet gone through menopause: ? You may still have a small chance of getting pregnant. ? You will need to use a reliable method of birth control after the procedure to prevent pregnancy.  You may stop having a menstrual period, or you may have only a small amount of bleeding during your period. Menstruation may return several years after the procedure. Tell a health care provider about:  Any allergies you have.  All medicines you are taking, including vitamins, herbs, eye drops, creams, and over-the-counter medicines.  Any problems you or family members have had with the use of anesthetic medicines.  Any blood disorders you have.  Any surgeries you have had.  Any medical conditions you have.  Whether you are pregnant or may be pregnant. What are the risks? Generally, this is a safe procedure. However, problems may occur, including:  A hole (perforation) in the uterus or bowel.  Infection in  the uterus, bladder, or vagina.  Bleeding.  Allergic reaction to medicines.  Damage to nearby structures or organs.  An air bubble in the lung (air embolus).  Problems with pregnancy.  Failure of the procedure.  Decreased ability to diagnose cancer in the endometrium. Scar tissue forms after the procedure, making it more difficult to get a sample of the uterine lining. What happens before the procedure? Medicines Ask your health care provider about:  Changing or stopping your regular medicines. This is especially important if you take diabetes medicines or blood thinners.  Taking medicines such as aspirin and ibuprofen. These medicines can thin your blood. Do not take these medicines before your procedure if your doctor tells you not to take them.  Taking over-the-counter medicines, vitamins, herbs, and supplements. Tests  You will have tests of your endometrium to make sure there are no precancerous cells or cancer cells present.  You may have an ultrasound of the uterus. General instructions  Do not use any products that contain nicotine or tobacco for at least 4 weeks before the procedure. These include cigarettes, chewing tobacco, and vaping devices, such as e-cigarettes. If you need help quitting, ask your health care provider.  You may be given medicines to thin the endometrium.  Ask your health care provider what steps will be taken to help prevent infection. These steps may include: ? Removing hair at the surgery site. ? Washing skin with a germ-killing soap. ? Taking antibiotic medicine.  Plan to have a responsible adult take you home from the hospital or clinic.  Plan to have a responsible adult care for you for the time you are told after you leave the hospital or clinic. This is important. What happens during the procedure?  You will lie on an exam table with your feet and legs supported as in a pelvic exam.  An IV will be inserted into one of your  veins.  You will be given a medicine to help you relax (sedative).  A surgical tool with a light and camera (resectoscope) will be inserted into your vagina and moved into your uterus. This allows your surgeon to see inside your uterus.  Endometrial tissue will be destroyed and removed, using one of the following methods: ? Radiofrequency. This uses an electrical current to destroy the endometrium. ? Cryotherapy. This uses extreme cold to freeze the endometrium. ? Heated fluid. This uses a heated salt and water (saline) solution to destroy the endometrium. ? Microwave. This uses high-energy microwaves to heat up the endometrium and destroy it. ? Thermal  balloon. This involves inserting a catheter with a balloon tip into the uterus. The balloon tip is filled with heated fluid to destroy the endometrium. The procedure may vary among health care providers and hospitals.   What happens after the procedure?  Your blood pressure, heart rate, breathing rate, and blood oxygen level will be monitored until you leave the hospital or clinic.  You may have vaginal bleeding for 4-6 weeks after the procedure. You may also have: ? Cramps. ? A thin, watery vaginal discharge that is light pink or brown. ? A need to urinate more than usual. ? Nausea.  If you were given a sedative during the procedure, it can affect you for several hours. Do not drive or operate machinery until your health care provider says that it is safe.  Do not have sex or insert anything into your vagina until your health care provider says it is safe. Summary  Endometrial ablation is done to treat many causes of heavy menstrual bleeding. The procedure destroys the thin inner layer of the lining of the uterus (endometrium).  This procedure is often done as an alternative to major surgery, such as removal of the uterus and cervix (hysterectomy).  Plan to have a responsible adult take you home from the hospital or clinic. This  information is not intended to replace advice given to you by your health care provider. Make sure you discuss any questions you have with your health care provider. Document Revised: 04/20/2020 Document Reviewed: 04/20/2020 Elsevier Patient Education  [place].

## 2020-11-27 ENCOUNTER — Other Ambulatory Visit: Payer: Self-pay

## 2020-11-27 ENCOUNTER — Ambulatory Visit
Admission: RE | Admit: 2020-11-27 | Discharge: 2020-11-27 | Disposition: A | Discharge status: Home / Self Care | Payer: BC Managed Care – PPO | Source: Ambulatory Visit | Attending: Internal Medicine | Admitting: Internal Medicine

## 2020-11-27 DIAGNOSIS — Z1231 Encounter for screening mammogram for malignant neoplasm of breast: Secondary | ICD-10-CM | POA: Insufficient documentation

## 2020-12-11 ENCOUNTER — Other Ambulatory Visit (INDEPENDENT_AMBULATORY_CARE_PROVIDER_SITE_OTHER): Payer: BC Managed Care – PPO

## 2020-12-11 ENCOUNTER — Other Ambulatory Visit: Payer: Self-pay

## 2020-12-11 DIAGNOSIS — R748 Abnormal levels of other serum enzymes: Secondary | ICD-10-CM

## 2020-12-11 LAB — HEPATIC FUNCTION PANEL
ALT: 20 U/L (ref 0–35)
AST: 20 U/L (ref 0–37)
Albumin: 4 g/dL (ref 3.5–5.2)
Alkaline Phosphatase: 52 U/L (ref 39–117)
Bilirubin, Direct: 0.1 mg/dL (ref 0.0–0.3)
Total Bilirubin: 0.6 mg/dL (ref 0.2–1.2)
Total Protein: 6.6 g/dL (ref 6.0–8.3)

## 2021-01-12 ENCOUNTER — Other Ambulatory Visit: Payer: Self-pay | Admitting: Obstetrics and Gynecology

## 2021-01-16 ENCOUNTER — Inpatient Hospital Stay: Admission: RE | Admit: 2021-01-16 | Payer: BC Managed Care – PPO | Source: Ambulatory Visit

## 2021-01-18 ENCOUNTER — Other Ambulatory Visit: Payer: Self-pay

## 2021-01-18 ENCOUNTER — Other Ambulatory Visit
Admission: RE | Admit: 2021-01-18 | Discharge: 2021-01-18 | Disposition: A | Discharge status: Home / Self Care | Payer: BC Managed Care – PPO | Source: Ambulatory Visit | Attending: Obstetrics and Gynecology | Admitting: Obstetrics and Gynecology

## 2021-01-18 DIAGNOSIS — Z20822 Contact with and (suspected) exposure to covid-19: Secondary | ICD-10-CM | POA: Diagnosis not present

## 2021-01-18 DIAGNOSIS — Z01818 Encounter for other preprocedural examination: Secondary | ICD-10-CM | POA: Diagnosis not present

## 2021-01-19 ENCOUNTER — Other Ambulatory Visit: Payer: Self-pay

## 2021-01-19 ENCOUNTER — Encounter
Admission: RE | Admit: 2021-01-19 | Discharge: 2021-01-19 | Disposition: A | Discharge status: Home / Self Care | Payer: BC Managed Care – PPO | Source: Ambulatory Visit | Attending: Obstetrics and Gynecology | Admitting: Obstetrics and Gynecology

## 2021-01-19 HISTORY — DX: Heart failure, unspecified: I50.9

## 2021-01-19 HISTORY — DX: Bronchitis, not specified as acute or chronic: J40

## 2021-01-19 LAB — SARS CORONAVIRUS 2 (TAT 6-24 HRS): SARS Coronavirus 2: NEGATIVE

## 2021-01-19 NOTE — Patient Instructions (Signed)
Your procedure is scheduled on: 01/22/21 Report to [facility]. To find out your arrival time please call [phone] between 1PM - 3PM on 01/19/21.  Remember: Instructions that are not followed completely may result in serious medical risk, up to and including death, or upon the discretion of your surgeon and anesthesiologist your surgery may need to be rescheduled.     _X__ 1. Do not eat food after midnight the night before your procedure.                 No gum chewing or hard candies. You may drink clear liquids up to 2 hours                 before you are scheduled to arrive for your surgery- DO not drink clear                 liquids within 2 hours of the start of your surgery.                 Clear Liquids include:  water, apple juice without pulp, clear carbohydrate                 drink such as Clearfast or Gatorade, Black Coffee or Tea (Do not add                 anything to coffee or tea). Diabetics water only  __X__2.  On the morning of surgery brush your teeth with toothpaste and water, you                 may rinse your mouth with mouthwash if you wish.  Do not swallow any              toothpaste of mouthwash.     _X__ 3.  No Alcohol for 24 hours before or after surgery.   _X__ 4.  Do Not Smoke or use e-cigarettes For 24 Hours Prior to Your Surgery.                 Do not use any chewable tobacco products for at least 6 hours prior to                 surgery.  ____  5.  Bring all medications with you on the day of surgery if instructed.   __X__  6.  Notify your doctor if there is any change in your medical condition      (cold, fever, infections).     Do not wear jewelry, make-up, hairpins, clips or nail polish. Do not wear lotions, powders, or perfumes.  Do not shave 48 hours prior to surgery. Men may shave face and neck. Do not bring valuables to the hospital.    [facility] is not responsible for any belongings or  valuables.  Contacts, dentures/partials or body piercings may not be worn into surgery. Bring a case for your contacts, glasses or hearing aids, a denture cup will be supplied. Leave your suitcase in the car. After surgery it may be brought to your room. For patients admitted to the hospital, discharge time is determined by your treatment team.   Patients discharged the day of surgery will not be allowed to drive home.   Please read over the following fact sheets that you were given:     __X__ Take these medicines the morning of surgery with A SIP OF WATER:  1. carvedilol (COREG) 12.5 MG tablet  2. loratadine (CLARITIN) 10 MG tablet  3.   4.  5.  6.  ____ Fleet Enema (as directed)   __X__ Use CHG Soap/SAGE wipes as directed  __X__ Use inhalers on the day of surgery  ____ Stop metformin/Janumet/Farxiga 2 days prior to surgery    ____ Take 1/2 of usual insulin dose the night before surgery. No insulin the morning          of surgery.   ____ Stop Blood Thinners Coumadin/Plavix/Xarelto/Pleta/Pradaxa/Eliquis/Effient/Aspirin  on   Or contact your Surgeon, Cardiologist or Medical Doctor regarding  ability to stop your blood thinners  __X__ Stop Anti-inflammatories 7 days before surgery such as Advil, Ibuprofen, Motrin,  BC or Goodies Powder, Naprosyn, Naproxen, Aleve, Aspirin    __X__ Stop all herbal supplements, fish oil or vitamin E until after surgery.    ____ Bring C-Pap to the hospital.

## 2021-01-22 ENCOUNTER — Encounter: Payer: Self-pay | Admitting: Internal Medicine

## 2021-01-22 ENCOUNTER — Ambulatory Visit: Payer: BC Managed Care – PPO

## 2021-01-22 ENCOUNTER — Encounter
Admission: RE | Disposition: A | Discharge status: Home / Self Care | Payer: Self-pay | Source: Home / Self Care | Attending: Obstetrics and Gynecology

## 2021-01-22 ENCOUNTER — Ambulatory Visit
Admission: RE | Admit: 2021-01-22 | Discharge: 2021-01-22 | Disposition: A | Discharge status: Home / Self Care | Payer: BC Managed Care – PPO | Attending: Obstetrics and Gynecology | Admitting: Obstetrics and Gynecology

## 2021-01-22 ENCOUNTER — Encounter: Payer: Self-pay | Admitting: Obstetrics and Gynecology

## 2021-01-22 DIAGNOSIS — N946 Dysmenorrhea, unspecified: Secondary | ICD-10-CM | POA: Diagnosis present

## 2021-01-22 DIAGNOSIS — D251 Intramural leiomyoma of uterus: Secondary | ICD-10-CM | POA: Insufficient documentation

## 2021-01-22 DIAGNOSIS — N920 Excessive and frequent menstruation with regular cycle: Secondary | ICD-10-CM | POA: Diagnosis not present

## 2021-01-22 DIAGNOSIS — Z79899 Other long term (current) drug therapy: Secondary | ICD-10-CM | POA: Insufficient documentation

## 2021-01-22 DIAGNOSIS — I110 Hypertensive heart disease with heart failure: Secondary | ICD-10-CM | POA: Insufficient documentation

## 2021-01-22 DIAGNOSIS — Z30432 Encounter for removal of intrauterine contraceptive device: Secondary | ICD-10-CM | POA: Insufficient documentation

## 2021-01-22 DIAGNOSIS — I509 Heart failure, unspecified: Secondary | ICD-10-CM | POA: Diagnosis not present

## 2021-01-22 DIAGNOSIS — Z880 Allergy status to penicillin: Secondary | ICD-10-CM | POA: Insufficient documentation

## 2021-01-22 DIAGNOSIS — N838 Other noninflammatory disorders of ovary, fallopian tube and broad ligament: Secondary | ICD-10-CM | POA: Insufficient documentation

## 2021-01-22 DIAGNOSIS — D252 Subserosal leiomyoma of uterus: Secondary | ICD-10-CM | POA: Diagnosis not present

## 2021-01-22 DIAGNOSIS — Z7982 Long term (current) use of aspirin: Secondary | ICD-10-CM | POA: Insufficient documentation

## 2021-01-22 DIAGNOSIS — N736 Female pelvic peritoneal adhesions (postinfective): Secondary | ICD-10-CM | POA: Diagnosis not present

## 2021-01-22 DIAGNOSIS — D649 Anemia, unspecified: Secondary | ICD-10-CM

## 2021-01-22 DIAGNOSIS — N72 Inflammatory disease of cervix uteri: Secondary | ICD-10-CM | POA: Diagnosis not present

## 2021-01-22 DIAGNOSIS — Z302 Encounter for sterilization: Secondary | ICD-10-CM | POA: Insufficient documentation

## 2021-01-22 DIAGNOSIS — Z6841 Body Mass Index (BMI) 40.0 and over, adult: Secondary | ICD-10-CM | POA: Diagnosis not present

## 2021-01-22 DIAGNOSIS — Z7951 Long term (current) use of inhaled steroids: Secondary | ICD-10-CM | POA: Insufficient documentation

## 2021-01-22 DIAGNOSIS — Z793 Long term (current) use of hormonal contraceptives: Secondary | ICD-10-CM | POA: Diagnosis not present

## 2021-01-22 HISTORY — PX: IUD REMOVAL: SHX5392

## 2021-01-22 HISTORY — PX: LAPAROSCOPIC BILATERAL SALPINGECTOMY: SHX5889

## 2021-01-22 HISTORY — PX: DILITATION & CURRETTAGE/HYSTROSCOPY WITH NOVASURE ABLATION: SHX5568

## 2021-01-22 LAB — CBC WITH DIFFERENTIAL/PLATELET
Abs Immature Granulocytes: 0.01 10*3/uL (ref 0.00–0.07)
Basophils Absolute: 0 10*3/uL (ref 0.0–0.1)
Basophils Relative: 1 %
Eosinophils Absolute: 0.1 10*3/uL (ref 0.0–0.5)
Eosinophils Relative: 3 %
HCT: 31.5 % — ABNORMAL LOW (ref 36.0–46.0)
Hemoglobin: 10.2 g/dL — ABNORMAL LOW (ref 12.0–15.0)
Immature Granulocytes: 0 %
Lymphocytes Relative: 26 %
Lymphs Abs: 1.5 10*3/uL (ref 0.7–4.0)
MCH: 30.8 pg (ref 26.0–34.0)
MCHC: 32.4 g/dL (ref 30.0–36.0)
MCV: 95.2 fL (ref 80.0–100.0)
Monocytes Absolute: 0.4 10*3/uL (ref 0.1–1.0)
Monocytes Relative: 7 %
Neutro Abs: 3.6 10*3/uL (ref 1.7–7.7)
Neutrophils Relative %: 63 %
Platelets: 231 10*3/uL (ref 150–400)
RBC: 3.31 MIL/uL — ABNORMAL LOW (ref 3.87–5.11)
RDW: 13.5 % (ref 11.5–15.5)
WBC: 5.7 10*3/uL (ref 4.0–10.5)
nRBC: 0 % (ref 0.0–0.2)

## 2021-01-22 LAB — POCT PREGNANCY, URINE: Preg Test, Ur: NEGATIVE

## 2021-01-22 SURGERY — SALPINGECTOMY, BILATERAL, LAPAROSCOPIC
Anesthesia: General

## 2021-01-22 MED ORDER — CHLORHEXIDINE GLUCONATE 0.12 % MT SOLN
15.0000 mL | Freq: Once | OROMUCOSAL | Status: AC
  Administered 2021-01-22: 15 mL via OROMUCOSAL

## 2021-01-22 MED ORDER — LIDOCAINE HCL (PF) 2 % IJ SOLN
INTRAMUSCULAR | Status: AC
  Filled 2021-01-22: qty 5

## 2021-01-22 MED ORDER — DEXTROSE 5 % IV SOLN
INTRAVENOUS | Status: DC | PRN
  Administered 2021-01-22: 3 g via INTRAVENOUS

## 2021-01-22 MED ORDER — EPHEDRINE 5 MG/ML INJ
INTRAVENOUS | Status: AC
  Filled 2021-01-22: qty 10

## 2021-01-22 MED ORDER — CEFAZOLIN SODIUM 1 G IJ SOLR
INTRAMUSCULAR | Status: AC
  Filled 2021-01-22: qty 30

## 2021-01-22 MED ORDER — LACTATED RINGERS IV SOLN: INTRAVENOUS | Status: DC

## 2021-01-22 MED ORDER — OXYCODONE HCL 5 MG PO TABS: 5.0000 mg | ORAL_TABLET | ORAL | Status: DC | PRN

## 2021-01-22 MED ORDER — PROPOFOL 10 MG/ML IV BOLUS
INTRAVENOUS | Status: DC | PRN
  Administered 2021-01-22: 180 mg via INTRAVENOUS

## 2021-01-22 MED ORDER — FAMOTIDINE 20 MG PO TABS
20.0000 mg | ORAL_TABLET | Freq: Once | ORAL | Status: AC
  Administered 2021-01-22: 20 mg via ORAL

## 2021-01-22 MED ORDER — BUPIVACAINE HCL (PF) 0.5 % IJ SOLN
INTRAMUSCULAR | Status: AC
  Filled 2021-01-22: qty 30

## 2021-01-22 MED ORDER — ONDANSETRON HCL 4 MG/2ML IJ SOLN: 4.0000 mg | Freq: Once | INTRAMUSCULAR | Status: DC | PRN

## 2021-01-22 MED ORDER — LIDOCAINE HCL (CARDIAC) PF 100 MG/5ML IV SOSY
PREFILLED_SYRINGE | INTRAVENOUS | Status: DC | PRN
  Administered 2021-01-22: 60 mg via INTRAVENOUS

## 2021-01-22 MED ORDER — DEXMEDETOMIDINE (PRECEDEX) IN NS 20 MCG/5ML (4 MCG/ML) IV SYRINGE
PREFILLED_SYRINGE | INTRAVENOUS | Status: DC | PRN
  Administered 2021-01-22: 8 ug via INTRAVENOUS

## 2021-01-22 MED ORDER — ROCURONIUM BROMIDE 10 MG/ML (PF) SYRINGE
PREFILLED_SYRINGE | INTRAVENOUS | Status: AC
  Filled 2021-01-22: qty 10

## 2021-01-22 MED ORDER — PHENYLEPHRINE HCL (PRESSORS) 10 MG/ML IV SOLN
INTRAVENOUS | Status: DC | PRN
  Administered 2021-01-22 (×2): 100 ug via INTRAVENOUS

## 2021-01-22 MED ORDER — ACETAMINOPHEN 10 MG/ML IV SOLN
INTRAVENOUS | Status: AC
  Filled 2021-01-22: qty 100

## 2021-01-22 MED ORDER — FENTANYL CITRATE (PF) 100 MCG/2ML IJ SOLN
INTRAMUSCULAR | Status: AC
  Administered 2021-01-22: 25 ug via INTRAVENOUS
  Filled 2021-01-22: qty 2

## 2021-01-22 MED ORDER — DEXAMETHASONE SODIUM PHOSPHATE 10 MG/ML IJ SOLN
INTRAMUSCULAR | Status: DC | PRN
  Administered 2021-01-22: 10 mg via INTRAVENOUS

## 2021-01-22 MED ORDER — GABAPENTIN 800 MG PO TABS: 800.0000 mg | ORAL_TABLET | Freq: Every day | ORAL | 0 refills | Status: DC

## 2021-01-22 MED ORDER — FENTANYL CITRATE (PF) 100 MCG/2ML IJ SOLN
INTRAMUSCULAR | Status: DC | PRN
  Administered 2021-01-22: 50 ug via INTRAVENOUS

## 2021-01-22 MED ORDER — IBUPROFEN 800 MG PO TABS: 800.0000 mg | ORAL_TABLET | Freq: Three times a day (TID) | ORAL | 1 refills | Status: AC

## 2021-01-22 MED ORDER — ONDANSETRON HCL 4 MG/2ML IJ SOLN
INTRAMUSCULAR | Status: AC
  Filled 2021-01-22: qty 2

## 2021-01-22 MED ORDER — PROPOFOL 10 MG/ML IV BOLUS
INTRAVENOUS | Status: AC
  Filled 2021-01-22: qty 20

## 2021-01-22 MED ORDER — FAMOTIDINE 20 MG PO TABS
ORAL_TABLET | ORAL | Status: AC
  Filled 2021-01-22: qty 1

## 2021-01-22 MED ORDER — ONDANSETRON HCL 4 MG/2ML IJ SOLN
INTRAMUSCULAR | Status: DC | PRN
  Administered 2021-01-22: 4 mg via INTRAVENOUS

## 2021-01-22 MED ORDER — OXYCODONE HCL 5 MG PO TABS: 5.0000 mg | ORAL_TABLET | ORAL | 0 refills | Status: DC | PRN

## 2021-01-22 MED ORDER — ROCURONIUM BROMIDE 100 MG/10ML IV SOLN
INTRAVENOUS | Status: DC | PRN
  Administered 2021-01-22: 10 mg via INTRAVENOUS
  Administered 2021-01-22: 50 mg via INTRAVENOUS

## 2021-01-22 MED ORDER — PHENYLEPHRINE HCL (PRESSORS) 10 MG/ML IV SOLN
INTRAVENOUS | Status: AC
  Filled 2021-01-22: qty 1

## 2021-01-22 MED ORDER — SUGAMMADEX SODIUM 500 MG/5ML IV SOLN
INTRAVENOUS | Status: DC | PRN
  Administered 2021-01-22: 300 mg via INTRAVENOUS

## 2021-01-22 MED ORDER — KETAMINE HCL 10 MG/ML IJ SOLN
INTRAMUSCULAR | Status: DC | PRN
  Administered 2021-01-22: 25 mg via INTRAVENOUS

## 2021-01-22 MED ORDER — MIDAZOLAM HCL 2 MG/2ML IJ SOLN
INTRAMUSCULAR | Status: DC | PRN
  Administered 2021-01-22: 2 mg via INTRAVENOUS

## 2021-01-22 MED ORDER — KETAMINE HCL 50 MG/5ML IJ SOSY
PREFILLED_SYRINGE | INTRAMUSCULAR | Status: AC
  Filled 2021-01-22: qty 5

## 2021-01-22 MED ORDER — ACETAMINOPHEN 500 MG PO TABS: 1000.0000 mg | ORAL_TABLET | Freq: Four times a day (QID) | ORAL | 0 refills | Status: AC

## 2021-01-22 MED ORDER — DOCUSATE SODIUM 100 MG PO CAPS: 100.0000 mg | ORAL_CAPSULE | Freq: Two times a day (BID) | ORAL | 0 refills | Status: DC

## 2021-01-22 MED ORDER — FENTANYL CITRATE (PF) 100 MCG/2ML IJ SOLN
INTRAMUSCULAR | Status: AC
  Filled 2021-01-22: qty 2

## 2021-01-22 MED ORDER — ORAL CARE MOUTH RINSE: 15.0000 mL | Freq: Once | OROMUCOSAL | Status: AC

## 2021-01-22 MED ORDER — GLYCOPYRROLATE 0.2 MG/ML IJ SOLN
INTRAMUSCULAR | Status: DC | PRN
  Administered 2021-01-22 (×2): .1 mg via INTRAVENOUS

## 2021-01-22 MED ORDER — DEXAMETHASONE SODIUM PHOSPHATE 10 MG/ML IJ SOLN
INTRAMUSCULAR | Status: AC
  Filled 2021-01-22: qty 1

## 2021-01-22 MED ORDER — KETOROLAC TROMETHAMINE 30 MG/ML IJ SOLN
INTRAMUSCULAR | Status: AC
  Filled 2021-01-22: qty 1

## 2021-01-22 MED ORDER — CHLORHEXIDINE GLUCONATE 0.12 % MT SOLN
OROMUCOSAL | Status: AC
  Filled 2021-01-22: qty 15

## 2021-01-22 MED ORDER — OXYCODONE HCL 5 MG PO TABS
ORAL_TABLET | ORAL | Status: AC
  Filled 2021-01-22: qty 1

## 2021-01-22 MED ORDER — GLYCOPYRROLATE 0.2 MG/ML IJ SOLN
INTRAMUSCULAR | Status: AC
  Filled 2021-01-22: qty 1

## 2021-01-22 MED ORDER — EPHEDRINE SULFATE 50 MG/ML IJ SOLN
INTRAMUSCULAR | Status: DC | PRN
  Administered 2021-01-22 (×4): 5 mg via INTRAVENOUS

## 2021-01-22 MED ORDER — KETOROLAC TROMETHAMINE 30 MG/ML IJ SOLN
INTRAMUSCULAR | Status: DC | PRN
  Administered 2021-01-22: 30 mg via INTRAVENOUS

## 2021-01-22 MED ORDER — FENTANYL CITRATE (PF) 100 MCG/2ML IJ SOLN
25.0000 ug | INTRAMUSCULAR | Status: DC | PRN
  Administered 2021-01-22: 25 ug via INTRAVENOUS

## 2021-01-22 MED ORDER — BUPIVACAINE HCL 0.5 % IJ SOLN
INTRAMUSCULAR | Status: DC | PRN
  Administered 2021-01-22: 25 mL

## 2021-01-22 MED ORDER — MIDAZOLAM HCL 2 MG/2ML IJ SOLN
INTRAMUSCULAR | Status: AC
  Filled 2021-01-22: qty 2

## 2021-01-22 MED ORDER — SUCCINYLCHOLINE CHLORIDE 200 MG/10ML IV SOSY
PREFILLED_SYRINGE | INTRAVENOUS | Status: AC
  Filled 2021-01-22: qty 10

## 2021-01-22 MED ORDER — SODIUM CHLORIDE 0.9 % IV SOLN
INTRAVENOUS | Status: DC | PRN
  Administered 2021-01-22: 20 ug/min via INTRAVENOUS

## 2021-01-22 MED ORDER — ACETAMINOPHEN 10 MG/ML IV SOLN
INTRAVENOUS | Status: DC | PRN
  Administered 2021-01-22: 1000 mg via INTRAVENOUS

## 2021-01-22 SURGICAL SUPPLY — 75 items
ABLATOR SURESOUND NOVASURE (ABLATOR) ×3 IMPLANT
ADH SKN CLS APL DERMABOND .7 (GAUZE/BANDAGES/DRESSINGS) ×2
APL PRP STRL LF DISP 70% ISPRP (MISCELLANEOUS) ×2
APL SRG 38 LTWT LNG FL B (MISCELLANEOUS)
APPLICATOR ARISTA FLEXITIP XL (MISCELLANEOUS) IMPLANT
BAG DRN RND TRDRP ANRFLXCHMBR (UROLOGICAL SUPPLIES) ×2
BAG PRESSURE INFUS OVAL 1000CC (MISCELLANEOUS) ×3 IMPLANT
BAG PRSS INFS LRG BLB STPCK NT (MISCELLANEOUS) ×2
BAG SPEC RTRVL LRG 6X4 10 (ENDOMECHANICALS)
BAG URINE DRAIN 2000ML AR STRL (UROLOGICAL SUPPLIES) ×3 IMPLANT
BARRIER ADHS 3X4 INTERCEED (GAUZE/BANDAGES/DRESSINGS) ×3 IMPLANT
BLADE SURG SZ11 CARB STEEL (BLADE) ×3 IMPLANT
BRR ADH 4X3 ABS CNTRL BYND (GAUZE/BANDAGES/DRESSINGS) ×2
CATH FOLEY 2WAY  5CC 16FR (CATHETERS) ×3
CATH FOLEY 2WAY 5CC 16FR (CATHETERS) ×2
CATH ROBINSON RED A/P 16FR (CATHETERS) IMPLANT
CATH URTH 16FR FL 2W BLN LF (CATHETERS) ×2 IMPLANT
CHLORAPREP W/TINT 26 (MISCELLANEOUS) ×3 IMPLANT
CORD MONOPOLAR M/FML 12FT (MISCELLANEOUS) ×3 IMPLANT
COVER WAND RF STERILE (DRAPES) ×3 IMPLANT
DERMABOND ADVANCED (GAUZE/BANDAGES/DRESSINGS) ×1
DERMABOND ADVANCED .7 DNX12 (GAUZE/BANDAGES/DRESSINGS) ×2 IMPLANT
DRAPE GENERAL ENDO 106X123.5 (DRAPES) ×3 IMPLANT
DRAPE STERI POUCH LG 24X46 STR (DRAPES) IMPLANT
DRAPE UNDER BUTTOCK W/FLU (DRAPES) ×3 IMPLANT
GAUZE PETROLATUM 1 X8 (GAUZE/BANDAGES/DRESSINGS) ×3 IMPLANT
GLOVE SURG ENC MOIS LTX SZ7 (GLOVE) ×6 IMPLANT
GLOVE SURG UNDER LTX SZ7.5 (GLOVE) ×3 IMPLANT
GOWN STRL REUS W/ TWL LRG LVL3 (GOWN DISPOSABLE) ×4 IMPLANT
GOWN STRL REUS W/TWL LRG LVL3 (GOWN DISPOSABLE) ×6
HEMOSTAT ARISTA ABSORB 3G PWDR (HEMOSTASIS) IMPLANT
IRRIGATION STRYKERFLOW (MISCELLANEOUS) IMPLANT
IRRIGATOR STRYKERFLOW (MISCELLANEOUS)
IV LACTATED RINGERS 1000ML (IV SOLUTION) ×3 IMPLANT
IV NS 1000ML (IV SOLUTION) ×3
IV NS 1000ML BAXH (IV SOLUTION) ×2 IMPLANT
IV NS IRRIG 3000ML ARTHROMATIC (IV SOLUTION) ×3 IMPLANT
KIT PINK PAD W/HEAD ARE REST (MISCELLANEOUS) ×3
KIT PINK PAD W/HEAD ARM REST (MISCELLANEOUS) ×2 IMPLANT
KIT PROCEDURE FLUENT (KITS) ×3 IMPLANT
KIT TURNOVER CYSTO (KITS) ×3 IMPLANT
L-HOOK LAP DISP 36CM (ELECTROSURGICAL)
LABEL OR SOLS (LABEL) ×3 IMPLANT
LHOOK LAP DISP 36CM (ELECTROSURGICAL) IMPLANT
LIGASURE VESSEL 5MM BLUNT TIP (ELECTROSURGICAL) ×3 IMPLANT
MANIFOLD NEPTUNE II (INSTRUMENTS) ×3 IMPLANT
NEEDLE FILTER BLUNT 18X 1/2SAF (NEEDLE) ×1
NEEDLE FILTER BLUNT 18X1 1/2 (NEEDLE) ×2 IMPLANT
NS IRRIG 1000ML POUR BTL (IV SOLUTION) ×3 IMPLANT
NS IRRIG 500ML POUR BTL (IV SOLUTION) IMPLANT
PACK DNC HYST (MISCELLANEOUS) ×3 IMPLANT
PACK GYN LAPAROSCOPIC (MISCELLANEOUS) ×3 IMPLANT
PAD OB MATERNITY 4.3X12.25 (PERSONAL CARE ITEMS) ×3 IMPLANT
PAD PREP 24X41 OB/GYN DISP (PERSONAL CARE ITEMS) ×3 IMPLANT
PENCIL ELECTRO HAND CTR (MISCELLANEOUS) IMPLANT
POUCH SPECIMEN RETRIEVAL 10MM (ENDOMECHANICALS) IMPLANT
SCISSORS METZENBAUM CVD 33 (INSTRUMENTS) ×3 IMPLANT
SEAL ROD LENS SCOPE MYOSURE (ABLATOR) ×3 IMPLANT
SET TUBE SMOKE EVAC HIGH FLOW (TUBING) ×3 IMPLANT
SLEEVE ENDOPATH XCEL 5M (ENDOMECHANICALS) ×6 IMPLANT
SOL PREP PROV IODINE SCRUB 4OZ (MISCELLANEOUS) ×3 IMPLANT
STRIP CLOSURE SKIN 1/4X4 (GAUZE/BANDAGES/DRESSINGS) ×3 IMPLANT
SUT MNCRL 4-0 (SUTURE) ×3
SUT MNCRL 4-0 27XMFL (SUTURE) ×2
SUT MNCRL AB 4-0 PS2 18 (SUTURE) ×3 IMPLANT
SUT VIC AB 2-0 UR6 27 (SUTURE) ×3 IMPLANT
SUT VIC AB 4-0 SH 27 (SUTURE) ×3
SUT VIC AB 4-0 SH 27XANBCTRL (SUTURE) ×2 IMPLANT
SUTURE MNCRL 4-0 27XMF (SUTURE) ×2 IMPLANT
SYR 50ML LL SCALE MARK (SYRINGE) IMPLANT
SYR 5ML LL (SYRINGE) ×3 IMPLANT
TOWEL OR 17X26 4PK STRL BLUE (TOWEL DISPOSABLE) ×3 IMPLANT
TROCAR XCEL NON-BLD 5MMX100MML (ENDOMECHANICALS) ×3 IMPLANT
TUBING ART PRESS 48 MALE/FEM (TUBING) IMPLANT
TUBING CONNECTING 10 (TUBING) ×3 IMPLANT

## 2021-01-22 NOTE — Anesthesia Postprocedure Evaluation (Signed)
Anesthesia Post Note  Patient: Heather Case  Procedure(s) Performed: LAPAROSCOPIC BILATERAL SALPINGECTOMY (Bilateral ) DILATATION & CURETTAGE/HYSTEROSCOPY (N/A ) INTRAUTERINE DEVICE (IUD) REMOVAL (N/A )  Patient location during evaluation: PACU Anesthesia Type: General Level of consciousness: awake and alert Pain management: pain level controlled Vital Signs Assessment: post-procedure vital signs reviewed and stable Respiratory status: spontaneous breathing and respiratory function stable Cardiovascular status: stable Anesthetic complications: no   No complications documented.   Last Vitals:  Vitals:   01/22/21 1241 01/22/21 1321  BP:  125/61  Pulse: 63 65  Resp: 17 16  Temp: (!) 36.1 C   SpO2: 93%     Last Pain:  Vitals:   01/22/21 1321  TempSrc:   PainSc: 3                  Fotios Amos K

## 2021-01-22 NOTE — Anesthesia Preprocedure Evaluation (Signed)
Anesthesia Evaluation  Patient identified by MRN, date of birth, ID band Patient awake    Reviewed: Allergy & Precautions, NPO status , Patient's Chart, lab work & pertinent test results  History of Anesthesia Complications Negative for: history of anesthetic complications  Airway Mallampati: III       Dental   Pulmonary asthma (exercise induced) , neg sleep apnea, neg COPD, Not current smoker, former smoker,           Cardiovascular hypertension, Pt. on medications +CHF (pregnancy induced cardiomyopathy)  (-) Past MI (-) dysrhythmias (-) Valvular Problems/Murmurs     Neuro/Psych neg Seizures    GI/Hepatic Neg liver ROS, neg GERD  ,  Endo/Other  neg diabetesMorbid obesity  Renal/GU negative Renal ROS     Musculoskeletal   Abdominal   Peds  Hematology   Anesthesia Other Findings   Reproductive/Obstetrics                             Anesthesia Physical Anesthesia Plan  ASA: III  Anesthesia Plan: General   Post-op Pain Management:    Induction: Intravenous  PONV Risk Score and Plan: 3  Airway Management Planned: Oral ETT  Additional Equipment:   Intra-op Plan:   Post-operative Plan:   Informed Consent: I have reviewed the patients History and Physical, chart, labs and discussed the procedure including the risks, benefits and alternatives for the proposed anesthesia with the patient or authorized representative who has indicated his/her understanding and acceptance.       Plan Discussed with:   Anesthesia Plan Comments:         Anesthesia Quick Evaluation

## 2021-01-22 NOTE — Op Note (Addendum)
Operative Report  Indications: menorrhagia, dysmenorrhea , failed medical management with IUD and OCPs, chronic medical conditions affecting care  Pre-operative Diagnosis: abnormal uterine bleeding, undesired fertility    Post-operative Diagnosis: suspected adenomyosis, submucosal, intramural and subserosal fibroids, suspected endometriosis. -significant pelvic adhesive disease -BMI 46.6 Modifier 22  Procedure: 1. Exam under anesthesia 2. Laparoscopic bilateral tubal ligation 3. Fractional D&C with endocervical curettage  4. Hysteroscopy 5. Hysteroscopic IUD removal 6. Laparoscopic lysis of adhesions 7. Laparoscopic excision of endometriosis  Did not perform Novasure or Myosure   Surgeon: Angelina Pih, MD  Assistant(s):  Laverta Baltimore, MD   Anesthesia: General LMA anesthesia  Anesthesiologist: Gunnar Fusi, MD Anesthesiologist: Gunnar Fusi, MD CRNA: Jerrye Noble, CRNA Student Nurse Anesthetist: Joellyn Quails, RN  Estimated Blood Loss:  33ml         Intraoperative medications: Toradol, IV acetominophen         Total IV Fluids: 1260ml  Urine Output: 390ml         Specimens: Endocervical curettings, endometrial curettings, left and right fallopian tubes, endometriosis excision         Complications:  None; patient tolerated the procedure well.         Disposition: PACU - hemodynamically stable.         Condition: stable  Findings: Uterus measuring 6 cm by sound; normal cervix, vagina, perineum.  Dense adhesions between anterior uterus and abdominal wall, omental adhesions in the lower abdomin. Old endometriosis scarring noted along right pelvis and in posterior cul de sac. Endo blebs on left ovary and tube. Adhesions significant between uterus and left anterior side wall.  Normal upper abdomen.  Cervical length: 4.5 cm Uterine cavity length: 5.5 cm Uterine cavity width: n/a  The IUD was wrapped behind a large submucosal fibroid  that entirely filled the endometrial cavity. The left fallopian tubal ostia was visible but the right was not. The fibroid base circumscribed approximately 40% of the cavity, not on a thin stalk at all. Removing the IUD took many hysteroscopic tries, and appeared to be embedded.  Indication for procedure/Consents: 50 y.o. [id number]  here for scheduled surgery for the aforementioned diagnoses.    Risks of surgery were discussed with the patient including but not limited to: bleeding which may require transfusion; infection which may require antibiotics; injury to uterus or surrounding organs; intrauterine scarring which may impair future fertility; need for additional procedures including laparotomy or laparoscopy; and other postoperative/anesthesia complications. Written informed consent was obtained.    Procedure Details:   The patient was taken to the operating room where general anesthesia was administered and was found to be adequate. After a formal and adequate timeout was performed, she was placed in the dorsal lithotomy position and examined with the above findings. She was then prepped and draped in the sterile manner. Her bladder was catheterized for an estimated amount of clear, yellow urine. A weighed speculum was then placed in the patient's vagina and a single tooth tenaculum was applied to the anterior lip of the cervix. The speculum was removed from the vagina.   Attention was then turned to the patient's abdomen where a 5-mm skin incision was made in the umbilical fold.  The Optiview 5-mm trocar and sleeve were then advanced without difficulty with the laparoscope under direct visualization into the abdomen.  The abdomen was then insufflated with carbon dioxide gas and adequate pneumoperitoneum was obtained.  A survey of the patient's pelvis and abdomen revealed above anatomy.  Adhesions were taken down using Ligasure device, careful to avoid damage to uterus, bladder and bowels. This  part of the case comprised a significant portion of the surgical time, >50%.  Once they were freed, the fallopian tubes were observed and found to be normal in appearance. A 90mm port was placed in each lower quadrant under direct visualization. A Ligasure device was then advanced through the operative port and used to coagulate and excise the distal portion of the Fallopian tube, including the fimbriated ends.  Good blanching and coagulation was noted at the site of the application.  There was no bleeding noted in the mesosalpinx.  A similar process was carried out on the right fallopian tube allowing for bilateral tubal sterilization.   Good hemostasis was noted overall.   Interceed was draped over the anterior and lateral uterus, after assuring hemostasis.  Local analgesia was drizzled on the operative sites.The instruments were then removed from the patient's abdomen and the fascial incision was repaired with 0 Vicryl, and the skin was closed with Dermabond.   Attention was turned to the cervix. Visualization was difficult due to pelvic laxity, and a second set of hands were called into the room.  An endocervical currettage was performed. Her cervix was serially dilated to 15 [place] using Hanks dilators.The hysteroscope was introduced to reveal the above findings.The hysteroscope was also used to determine the level of the internal os, and measurements were confirmed. The uterine cavity was carefully examined. The IUD was difficult to remove and took multiple tries. The fluent management system was deployed to assist in visualization. The large fibroid was noted. The IUD was successfully removed.  the left ostum was recognized, the fibroid concealed the right, and the fibroid was attached approximately 40% of the circumference of the cavity.  The decision was made to defer hysteroscopic removal of the fibroid, given poor visualization due to bleeding, no prior consent and the likelihood that the  adhesiolysis may help resolve her pain but the fibroids and suspected adenomyosis will continue to give her bleeding even if we were successful today. We can plan for that surgery if desired, with vasopressin and Lysteda on hand.   The patient will be discharged to home as per PACU criteria.  Routine postoperative instructions given.  She was prescribed Percocet, Ibuprofen, gabapentin and Colace.  She will follow up in the clinic in two weeks for postoperative evaluation.

## 2021-01-22 NOTE — H&P (Signed)
Chief Complaint:   Heather Case is a 50 y.o. female here for Pre Op Consulting (sign consents)  History of Present Illness: Patient presents for a preoperative visit to schedule a Novasure Endometrial Ablation with Hysteroscopy, IUD Removal, and with Laparoscopic Bilateral Salpingectomy. Surgery indications: longstanding menorrhagia, dysmenorrhea, s/p failed OCPs & IUDs. She is currently on OCPs for symptom control, temporarily prior to surgery.  Medical clearance from Dr. Clayborn Bigness has been completed. D/t CHF, hx of postpartum cardiomyopathy. Patient is on a beta-blocker (Carvedilol), ACE inhibitor (Lisinopril), and diuretic (Lasix).   Body mass index is 45.81 kg/m.  Workup has included: Pap smear: 10/2018 neg/neg EMBx 11/2020: Fragments of benign lower uterine segment endometrium and endocervical epithelium admixed with mucus and blood. TVUS 10/2019: Uterus anteverted, 10.4 cm x 4.4 cm x 5.7 cm; Endometrium=5.55mm IUD seen posterior & Rt of endometrium Free fluid seen in PCDS Lt simple ovarian cyst=2.7cm; Rt complex septated ovarian cyst=2.6cm; septation=0.34cm *limited visualization due to body habitus  Pertinent Hx: -Hx of D&C, hysteroscopy -C/S x2  Past Medical History: has a past medical history of CHF (congestive heart failure) (CMS-HCC), History of abnormal cervical Pap smear, Hypertension, Obesity, Postpartum cardiomyopathy, and Shortness of breath.  Past Surgical History: has a past surgical history that includes IUD insertion (12/2013); Mastectomy partial / lumpectomy (Right, 1993); Cesarean section; Colposcopy; Hysteroscopy (11/2018); and Combined hysteroscopy diagnostic / D&C. Family History: family history includes Breast cancer in her maternal grandmother, mother, and paternal grandmother; High blood pressure (Hypertension) in her father. Social History: reports that she has never smoked. She has never used smokeless tobacco. She reports current alcohol use. She reports that she  does not use drugs. OB/GYN History:  OB History  Gravida  2  Para  2  Term  2  Preterm   AB   Living  2   SAB   IAB   Ectopic   Molar   Multiple   Live Births  2     Allergies: is allergic to penicillins. Medications:  Current Outpatient Medications:  . albuterol 90 mcg/actuation inhaler, INHALE 1 2 PUFFS INTO THE LUNGS EVERY 6 (SIX) HOURS AS NEEDED FOR WHEEZING OR SHORTNESS OF BREATH., Disp: , Rfl:  . aspirin 81 MG EC tablet, Take 81 mg by mouth once daily., Disp: , Rfl:  . carvediloL (COREG) 12.5 MG tablet, TAKE 1 TABLET BY MOUTH TWICE A DAY WITH MEALS, Disp: 180 tablet, Rfl: 0 . cholecalciferol, vitamin D3, 125 mcg (5,000 unit) Tab, Take by mouth, Disp: , Rfl:  . ergocalciferol, vitamin D2, 50,000 unit capsule, Take 50,000 Units by mouth once a week, Disp: , Rfl:  . fluticasone (FLONASE) 50 mcg/actuation nasal spray, Place 1 spray into both nostrils 2 (two) times daily, Disp: 16 g, Rfl: 0 . FUROsemide (LASIX) 20 MG tablet, Take 1 tablet (20 mg total) by mouth once daily, Disp: 90 tablet, Rfl: 3 . Lacto no.41-B.bifid-B.animalis (ADVANCED PROBIOTIC-10) 13 mg (3 billion cell) Cap, Take by mouth, Disp: , Rfl:  . levonorgestreL (MIRENA 52 MG) 20 mcg/24 hr (6 years) IUD, Insert 1 each into the uterus once Follow package directions., Disp: , Rfl:  . loratadine (CLARITIN) 10 mg tablet, TAKE 1 TABLET BY MOUTH EVERY DAY, Disp: 90 tablet, Rfl: 0 . losartan (COZAAR) 100 MG tablet, Take by mouth once daily, Disp: , Rfl:  . norgestimate-ethinyl estradioL (SPRINTEC 0.25/35, 28,) 0.25-35 mg-mcg tablet, Take 1 tab po daily, skip placebo pills and take continuously, Disp: 84 tablet, Rfl: 3 . SYMBICORT 80-4.5 mcg/actuation inhaler, INHALE  2 PUFFS INTO THE LUNGS 2 (TWO) TIMES DAILY. RINSE MOUTH OUT, Disp: , Rfl:  . chlorhexidine (PERIDEX) 0.12 % solution, as directed (Patient not taking: Reported on 01/08/2021 ), Disp: , Rfl:  . meloxicam (MOBIC) 15 MG tablet, daily (Patient not  taking: Reported on 01/08/2021 ), Disp: , Rfl:  . montelukast (SINGULAIR) 10 mg tablet, Take by mouth (Patient not taking: Reported on 01/08/2021 ), Disp: , Rfl:  . norethindrone (AYGESTIN) 5 mg tablet, Take 1 tablet (5 mg total) by mouth once daily for 10 days, Disp: 10 tablet, Rfl: 0 . triamcinolone 0.1 % cream, , Disp: , Rfl:  . valACYclovir (VALTREX) 1000 MG tablet, Take by mouth (Patient not taking: Reported on 01/08/2021 ), Disp: , Rfl:   Review of Systems: No SOB, no palpitations or chest pain, no new lower extremity edema, no nausea or vomiting or bowel or bladder complaints. See HPI for gyn specific ROS.  Exam:   BP 120/66  Ht 167.6 cm (5\' 6" )  Wt (!) 128.7 kg (283 lb 12.8 oz)  BMI 45.81 kg/m   Constitutional: General appearance: Well nourished, well developed female in no acute distress.  Neuro/psych: Normal mood and affect. No gross motor deficits. Neck: Supple, normal appearance.  Respiratory: Normal respiratory effort, no use of accessory muscles Skin: No visible rashes or external lesions  Impression:   The primary encounter diagnosis was Preoperative clearance. Diagnoses of Excessive or frequent menstruation and Primary dysmenorrhea were also pertinent to this visit.  Plan:   1. Menorrhagia, Dysmenorrhea Consents signed today. -Will plan for Laparoscopic Bilateral Salpingectomy, IUD Removal, Hysteroscopy, Novasure Endometrial Ablation, for treatment of her menorrhagia & dysmenorrhea.  -Risks of surgery were discussed with the patient including but not limited to: bleeding which may require transfusion; infection which may require antibiotics; injury to uterus or surrounding organs; intrauterine scarring which may impair future fertility; need for additional procedures including laparotomy or laparoscopy; and other postoperative/anesthesia complications. Written informed consent was obtained. She has been counseled on risks of surgical sterilization including bleeding,  infection, pain, injury during procedure, risk of need for further procedures/surgeries due to injury or abnormalities at the time of surgery, thromboembolic events, exacerbation of ongoing medical conditions, risk of ectopic pregnancy, risk of failure of procedure to prevent pregnancy, medication reactions as well as the risk of anesthesia. Patient verbalizes understanding. Consent form signed. Preoperative and postoperative instructions provided. Written and verbal education provided. No barriers to learning.  Will schedule as soon as we can.  -Will send in her OCP since her refill is not due but she needs more pill for temporary bleeding control. She is aware of the risks of estrogen containing OCPs, but the benefit for her temporarily outweigh the risks.  She is aware that this may not help her pain, and will likely but not definitively help her bleeding. Definitive surgery is always an option.  Patient to remain on beta-blocker per Dr. Clayborn Bigness (cardio). She was instructed to take her beta-blocker pre-operatively on the morning of surgery with a small sip of water.  Diagnoses and all orders for this visit:  Preoperative clearance  Excessive or frequent menstruation  Primary dysmenorrhea  Other orders - norgestimate-ethinyl estradioL (SPRINTEC 0.25/35, 28,) 0.25-35 mg-mcg tablet; Take 1 tab po daily, skip placebo pills and take continuously  Return for Postop check.

## 2021-01-22 NOTE — Anesthesia Procedure Notes (Signed)
Procedure Name: Intubation Date/Time: 01/22/2021 9:27 AM Performed by: Joellyn Quails, RN Pre-anesthesia Checklist: Patient identified, Emergency Drugs available, Suction available and Patient being monitored Patient Re-evaluated:Patient Re-evaluated prior to induction Oxygen Delivery Method: Circle system utilized Preoxygenation: Pre-oxygenation with 100% oxygen Induction Type: IV induction Ventilation: Mask ventilation without difficulty Laryngoscope Size: McGraph and 4 Grade View: Grade II Tube type: Oral Tube size: 7.0 mm Number of attempts: 2 Airway Equipment and Method: Stylet,  Oral airway and Video-laryngoscopy Placement Confirmation: ETT inserted through vocal cords under direct vision,  positive ETCO2 and breath sounds checked- equal and bilateral Secured at: 20 cm Tube secured with: Tape Dental Injury: Teeth and Oropharynx as per pre-operative assessment  Comments: Initial attempt DVL with MAC 4, grade 3 view. Successful intubation with McGraph 4.

## 2021-01-22 NOTE — Discharge Instructions (Addendum)
Laparoscopic Ovarian Surgery Discharge Instructions  For the next three days, take ibuprofen and acetaminophen on a schedule, every 8 hours. You can take them together or you can intersperse them, and take one every four hours. I also gave you gabapentin for nighttime, to help you sleep and also to control pain. Take gabapentin medicines at night for at least the next 3 nights. You also have a narcotic, oxycodone, to take as needed if the above medicines don't help.  Postop constipation is a major cause of pain. Stay well hydrated, walk as you tolerate, and take over the counter senna as well as stool softeners if you need them.   RISKS AND COMPLICATIONS   Infection.  Bleeding.  Injury to surrounding organs.  Anesthetic side effects.   PROCEDURE   You may be given a medicine to help you relax (sedative) before the procedure. You will be given a medicine to make you sleep (general anesthetic) during the procedure.  A tube will be put down your throat to help your breath while under general anesthesia.  Several small cuts (incisions) are made in the lower abdominal area and one incision is made near the belly button.  Your abdominal area will be inflated with a safe gas (carbon dioxide). This helps give the surgeon room to operate, visualize, and helps the surgeon avoid other organs.  A thin, lighted tube (laparoscope) with a camera attached is inserted into your abdomen through the incision near the belly button. Other small instruments may also be inserted through other abdominal incisions.  The ovary is located and are removed.  After the ovary is removed, the gas is released from the abdomen.  The incisions will be closed with stitches (sutures), and Dermabond. A bandage may be placed over the incisions.  AFTER THE PROCEDURE   You will also have some mild abdominal discomfort for 3-7 days. You will be given pain medicine to ease any discomfort.  As long as there are no  problems, you may be allowed to go home. Someone will need to drive you home and be with you for at least 24 hours once home.  You may have some mild discomfort in the throat. This is from the tube placed in your throat while you were sleeping.  You may experience discomfort in the shoulder area from some trapped air between the liver and diaphragm. This sensation is normal and will slowly go away on its own.  HOME CARE INSTRUCTIONS   Take all medicines as directed.  Only take over-the-counter or prescription medicines for pain, discomfort, or fever as directed by your caregiver.  Resume daily activities as directed.  Showers are preferred over baths for 2 weeks.  You may resume sexual activities in 1 week or as you feel you would like to.  Do not drive while taking narcotics.  SEEK MEDICAL CARE IF: .  There is increasing abdominal pain.  You feel lightheaded or faint.  You have the chills.  You have an oral temperature above 102 F (38.9 C).  There is pus-like (purulent) drainage from any of the wounds.  You are unable to pass gas or have a bowel movement.  You feel sick to your stomach (nauseous) or throw up (vomit) and can't control it with your medicines.  MAKE SURE YOU:   Understand these instructions.  Will watch your condition.  Will get help right away if you are not doing well or get worse.  ExitCare Patient Information 2013 [place].  AMBULATORY SURGERY  DISCHARGE INSTRUCTIONS   1) The drugs that you were given will stay in your system until tomorrow so for the next 24 hours you should not:  A) Drive an automobile B) Make any legal decisions C) Drink any alcoholic beverage   2) You may resume regular meals tomorrow.  Today it is better to start with liquids and gradually work up to solid foods.  You may eat anything you prefer, but it is better to start with liquids, then soup and crackers, and gradually work up to solid  foods.   3) Please notify your doctor immediately if you have any unusual bleeding, trouble breathing, redness and pain at the surgery site, drainage, fever, or pain not relieved by medication.    4) Additional Instructions:        Please contact your physician with any problems or Same Day Surgery at [phone], Monday through Friday 6 am to 4 pm, or [place] at [facility] number at [phone].

## 2021-01-22 NOTE — Transfer of Care (Signed)
Immediate Anesthesia Transfer of Care Note  Patient: Heather Case  Procedure(s) Performed: LAPAROSCOPIC BILATERAL SALPINGECTOMY (Bilateral ) DILATATION & CURETTAGE/HYSTEROSCOPY (N/A ) INTRAUTERINE DEVICE (IUD) REMOVAL (N/A )  Patient Location: PACU  Anesthesia Type:General  Level of Consciousness: awake and patient cooperative  Airway & Oxygen Therapy: Patient Spontanous Breathing and Patient connected to face mask oxygen  Post-op Assessment: Report given to RN, Post -op Vital signs reviewed and stable and Patient moving all extremities  Post vital signs: Reviewed and stable  Last Vitals:  Vitals Value Taken Time  BP 106/47 01/22/21 1150  Temp 36.3 C 01/22/21 1150  Pulse 56 01/22/21 1156  Resp 17 01/22/21 1156  SpO2 100 % 01/22/21 1156  Vitals shown include unvalidated device data.  Last Pain:  Vitals:   01/22/21 1150  TempSrc:   PainSc: Asleep         Complications: No complications documented.

## 2021-01-23 ENCOUNTER — Encounter: Payer: Self-pay | Admitting: Obstetrics and Gynecology

## 2021-01-23 LAB — TYPE AND SCREEN
ABO/RH(D): AB POS
Antibody Screen: NEGATIVE

## 2021-01-23 LAB — SURGICAL PATHOLOGY

## 2021-01-23 NOTE — Telephone Encounter (Signed)
Reviewed her cbc.  Decreased hgb. Probably due to heavy periods.  Confirm no GI issues.  I would like to recheck and cbc and iron studies.  If agreeable, please schedule a f/u non fasting lab appt over the next week.  I have placed order for labs.

## 2021-01-24 ENCOUNTER — Other Ambulatory Visit (INDEPENDENT_AMBULATORY_CARE_PROVIDER_SITE_OTHER): Payer: BC Managed Care – PPO

## 2021-01-24 ENCOUNTER — Other Ambulatory Visit: Payer: Self-pay

## 2021-01-24 DIAGNOSIS — D649 Anemia, unspecified: Secondary | ICD-10-CM | POA: Diagnosis not present

## 2021-01-24 DIAGNOSIS — Z1389 Encounter for screening for other disorder: Secondary | ICD-10-CM

## 2021-01-24 LAB — IBC + FERRITIN
Ferritin: 10 ng/mL (ref 10.0–291.0)
Iron: 25 ug/dL — ABNORMAL LOW (ref 42–145)
Saturation Ratios: 6.3 % — ABNORMAL LOW (ref 20.0–50.0)
Transferrin: 283 mg/dL (ref 212.0–360.0)

## 2021-01-24 LAB — CBC WITH DIFFERENTIAL/PLATELET
Basophils Absolute: 0 10*3/uL (ref 0.0–0.1)
Basophils Relative: 0.7 % (ref 0.0–3.0)
Eosinophils Absolute: 0.1 10*3/uL (ref 0.0–0.7)
Eosinophils Relative: 1.5 % (ref 0.0–5.0)
HCT: 31 % — ABNORMAL LOW (ref 36.0–46.0)
Hemoglobin: 10.3 g/dL — ABNORMAL LOW (ref 12.0–15.0)
Lymphocytes Relative: 42.5 % (ref 12.0–46.0)
Lymphs Abs: 2.7 10*3/uL (ref 0.7–4.0)
MCHC: 33.3 g/dL (ref 30.0–36.0)
MCV: 93.8 fl (ref 78.0–100.0)
Monocytes Absolute: 0.6 10*3/uL (ref 0.1–1.0)
Monocytes Relative: 9.5 % (ref 3.0–12.0)
Neutro Abs: 2.9 10*3/uL (ref 1.4–7.7)
Neutrophils Relative %: 45.8 % (ref 43.0–77.0)
Platelets: 260 10*3/uL (ref 150.0–400.0)
RBC: 3.31 Mil/uL — ABNORMAL LOW (ref 3.87–5.11)
RDW: 14 % (ref 11.5–15.5)
WBC: 6.4 10*3/uL (ref 4.0–10.5)

## 2021-01-24 LAB — VITAMIN B12: Vitamin B-12: 159 pg/mL — ABNORMAL LOW (ref 211–911)

## 2021-01-24 NOTE — Addendum Note (Signed)
Addended by: Leeanne Rio on: 01/24/2021 02:01 PM   Modules accepted: Orders

## 2021-01-24 NOTE — Addendum Note (Signed)
Addended by: Leeanne Rio on: 01/24/2021 02:31 PM   Modules accepted: Orders

## 2021-01-25 ENCOUNTER — Other Ambulatory Visit: Payer: Self-pay | Admitting: Internal Medicine

## 2021-01-25 DIAGNOSIS — D649 Anemia, unspecified: Secondary | ICD-10-CM

## 2021-01-25 NOTE — Progress Notes (Signed)
Order placed for f/u labs.  

## 2021-02-02 ENCOUNTER — Other Ambulatory Visit: Payer: Self-pay

## 2021-02-02 ENCOUNTER — Ambulatory Visit (INDEPENDENT_AMBULATORY_CARE_PROVIDER_SITE_OTHER): Payer: BC Managed Care – PPO

## 2021-02-02 VITALS — Temp 96.6°F

## 2021-02-02 DIAGNOSIS — E538 Deficiency of other specified B group vitamins: Secondary | ICD-10-CM

## 2021-02-02 MED ORDER — CYANOCOBALAMIN 1000 MCG/ML IJ SOLN
1000.0000 ug | Freq: Once | INTRAMUSCULAR | Status: AC
  Administered 2021-02-02: 1000 ug via INTRAMUSCULAR

## 2021-02-02 NOTE — Progress Notes (Signed)
Patient came in today for 1st B-12 injection in left deltoid. Patient tolerated well.

## 2021-02-13 ENCOUNTER — Other Ambulatory Visit: Payer: Self-pay

## 2021-02-13 ENCOUNTER — Ambulatory Visit (INDEPENDENT_AMBULATORY_CARE_PROVIDER_SITE_OTHER): Payer: BC Managed Care – PPO

## 2021-02-13 DIAGNOSIS — E538 Deficiency of other specified B group vitamins: Secondary | ICD-10-CM | POA: Diagnosis not present

## 2021-02-13 MED ORDER — CYANOCOBALAMIN 1000 MCG/ML IJ SOLN
1000.0000 ug | Freq: Once | INTRAMUSCULAR | Status: AC
  Administered 2021-02-13: 1000 ug via INTRAMUSCULAR

## 2021-02-13 NOTE — Progress Notes (Signed)
Patient presented for B 12 injection to left deltoid, patient voiced no concerns nor showed any signs of distress during injection. 

## 2021-02-20 ENCOUNTER — Ambulatory Visit (INDEPENDENT_AMBULATORY_CARE_PROVIDER_SITE_OTHER): Payer: BC Managed Care – PPO

## 2021-02-20 ENCOUNTER — Other Ambulatory Visit: Payer: Self-pay

## 2021-02-20 DIAGNOSIS — E538 Deficiency of other specified B group vitamins: Secondary | ICD-10-CM

## 2021-02-20 MED ORDER — CYANOCOBALAMIN 1000 MCG/ML IJ SOLN
1000.0000 ug | Freq: Once | INTRAMUSCULAR | Status: AC
  Administered 2021-02-20: 1000 ug via INTRAMUSCULAR

## 2021-02-20 NOTE — Progress Notes (Signed)
Patient presented for B 12 injection to right deltoid, patient voiced no concerns nor showed any signs of distress during injection. 

## 2021-02-27 ENCOUNTER — Ambulatory Visit (INDEPENDENT_AMBULATORY_CARE_PROVIDER_SITE_OTHER): Payer: BC Managed Care – PPO

## 2021-02-27 ENCOUNTER — Other Ambulatory Visit: Payer: Self-pay

## 2021-02-27 ENCOUNTER — Telehealth: Payer: Self-pay | Admitting: Internal Medicine

## 2021-02-27 DIAGNOSIS — E538 Deficiency of other specified B group vitamins: Secondary | ICD-10-CM

## 2021-02-27 MED ORDER — CYANOCOBALAMIN 1000 MCG/ML IJ SOLN
1000.0000 ug | Freq: Once | INTRAMUSCULAR | Status: AC
  Administered 2021-02-27: 1000 ug via INTRAMUSCULAR

## 2021-02-27 NOTE — Progress Notes (Signed)
Patient presented for B 12 injection to left deltoid, patient voiced no concerns nor showed any signs of distress during injection. 

## 2021-02-27 NOTE — Telephone Encounter (Signed)
Pt called back returning your call °

## 2021-02-27 NOTE — Telephone Encounter (Signed)
PT would like to know if they need to go ahead and schedule their next B12 shot for next week or schedule labs to be done.

## 2021-02-27 NOTE — Telephone Encounter (Signed)
Left message to return call.  Patient was needing b12 injections weekly for 4 weeks and then b12 shots once monthly. Looks as though Patient has only had 3 weekly injections. If this is verified Patient will need another shot a week from 02/20/21 and then another one month later.

## 2021-02-27 NOTE — Telephone Encounter (Signed)
When does Patient need to repeat B12 labs? Last done 01/2021.

## 2021-03-01 NOTE — Telephone Encounter (Signed)
Left message to return call 

## 2021-03-01 NOTE — Telephone Encounter (Signed)
Pt called back. °

## 2021-03-02 NOTE — Telephone Encounter (Signed)
Patient returned office phone call about her B12.

## 2021-03-05 ENCOUNTER — Other Ambulatory Visit: Payer: Self-pay | Admitting: Internal Medicine

## 2021-03-05 DIAGNOSIS — E538 Deficiency of other specified B group vitamins: Secondary | ICD-10-CM

## 2021-03-05 DIAGNOSIS — Z1389 Encounter for screening for other disorder: Secondary | ICD-10-CM

## 2021-03-05 DIAGNOSIS — R7303 Prediabetes: Secondary | ICD-10-CM

## 2021-03-05 DIAGNOSIS — I10 Essential (primary) hypertension: Secondary | ICD-10-CM

## 2021-03-05 NOTE — Telephone Encounter (Signed)
B12 Q30 days  Will repeat B 12 with labs fasting 05/08/21  You cant get too much B12 so I am not worried about this for now weekly x 1 month should be adequate now every 30 days   Make sure pt calls [facility] to get colonoscopy rescheduled this is impt

## 2021-03-05 NOTE — Telephone Encounter (Signed)
I called and spoke with the patient and informed her to call [facility] to reschedule her colonoscopy and she understood. She stated she received a call from [place] about her B12 being done monthly now.  Kyran Connaughton,cma

## 2021-03-26 ENCOUNTER — Other Ambulatory Visit: Payer: Self-pay | Admitting: Internal Medicine

## 2021-04-03 ENCOUNTER — Ambulatory Visit (INDEPENDENT_AMBULATORY_CARE_PROVIDER_SITE_OTHER): Payer: BC Managed Care – PPO

## 2021-04-03 ENCOUNTER — Other Ambulatory Visit: Payer: Self-pay

## 2021-04-03 DIAGNOSIS — E538 Deficiency of other specified B group vitamins: Secondary | ICD-10-CM

## 2021-04-03 MED ORDER — CYANOCOBALAMIN 1000 MCG/ML IJ SOLN
1000.0000 ug | Freq: Once | INTRAMUSCULAR | Status: AC
  Administered 2021-04-03: 1000 ug via INTRAMUSCULAR

## 2021-04-03 NOTE — Progress Notes (Signed)
Patient presented for B 12 injection to left deltoid, patient voiced no concerns nor showed any signs of distress during injection. 

## 2021-04-04 ENCOUNTER — Other Ambulatory Visit: Payer: Self-pay | Admitting: Internal Medicine

## 2021-04-04 DIAGNOSIS — I10 Essential (primary) hypertension: Secondary | ICD-10-CM

## 2021-04-05 ENCOUNTER — Other Ambulatory Visit: Payer: Self-pay | Admitting: Internal Medicine

## 2021-04-05 DIAGNOSIS — J309 Allergic rhinitis, unspecified: Secondary | ICD-10-CM

## 2021-05-01 ENCOUNTER — Other Ambulatory Visit: Payer: Self-pay | Admitting: Obstetrics and Gynecology

## 2021-05-09 ENCOUNTER — Ambulatory Visit (INDEPENDENT_AMBULATORY_CARE_PROVIDER_SITE_OTHER): Payer: BC Managed Care – PPO | Admitting: Internal Medicine

## 2021-05-09 ENCOUNTER — Other Ambulatory Visit: Payer: Self-pay

## 2021-05-09 ENCOUNTER — Encounter: Payer: Self-pay | Admitting: Internal Medicine

## 2021-05-09 VITALS — BP 124/80 | HR 84 | Temp 98.1°F | Ht 66.0 in | Wt 293.4 lb

## 2021-05-09 DIAGNOSIS — D509 Iron deficiency anemia, unspecified: Secondary | ICD-10-CM | POA: Diagnosis not present

## 2021-05-09 DIAGNOSIS — R059 Cough, unspecified: Secondary | ICD-10-CM

## 2021-05-09 DIAGNOSIS — I10 Essential (primary) hypertension: Secondary | ICD-10-CM | POA: Diagnosis not present

## 2021-05-09 DIAGNOSIS — R7303 Prediabetes: Secondary | ICD-10-CM

## 2021-05-09 DIAGNOSIS — E559 Vitamin D deficiency, unspecified: Secondary | ICD-10-CM

## 2021-05-09 DIAGNOSIS — Z01818 Encounter for other preprocedural examination: Secondary | ICD-10-CM

## 2021-05-09 DIAGNOSIS — J40 Bronchitis, not specified as acute or chronic: Secondary | ICD-10-CM

## 2021-05-09 DIAGNOSIS — E538 Deficiency of other specified B group vitamins: Secondary | ICD-10-CM | POA: Diagnosis not present

## 2021-05-09 DIAGNOSIS — J309 Allergic rhinitis, unspecified: Secondary | ICD-10-CM

## 2021-05-09 DIAGNOSIS — R062 Wheezing: Secondary | ICD-10-CM

## 2021-05-09 MED ORDER — CYANOCOBALAMIN 1000 MCG/ML IJ SOLN: 1000.0000 ug | INTRAMUSCULAR | 4 refills | Status: DC

## 2021-05-09 MED ORDER — CARVEDILOL 12.5 MG PO TABS: 12.5000 mg | ORAL_TABLET | Freq: Two times a day (BID) | ORAL | 3 refills | Status: DC

## 2021-05-09 MED ORDER — CYANOCOBALAMIN 1000 MCG/ML IJ SOLN
1000.0000 ug | INTRAMUSCULAR | Status: DC
  Administered 2021-05-09: 1000 ug via INTRAMUSCULAR

## 2021-05-09 MED ORDER — "SYRINGE/NEEDLE (DISP) 25G X 1-1/2"" 5 ML MISC": 1.0000 | 4 refills | Status: DC

## 2021-05-09 MED ORDER — ALBUTEROL SULFATE HFA 108 (90 BASE) MCG/ACT IN AERS: 1.0000 | INHALATION_SPRAY | Freq: Four times a day (QID) | RESPIRATORY_TRACT | 12 refills | Status: DC | PRN

## 2021-05-09 MED ORDER — FUROSEMIDE 20 MG PO TABS: 20.0000 mg | ORAL_TABLET | Freq: Every day | ORAL | 11 refills | Status: DC

## 2021-05-09 MED ORDER — FLUTICASONE PROPIONATE 50 MCG/ACT NA SUSP: 2.0000 | Freq: Every day | NASAL | 11 refills | Status: DC

## 2021-05-09 MED ORDER — BUDESONIDE-FORMOTEROL FUMARATE 80-4.5 MCG/ACT IN AERO: 2.0000 | INHALATION_SPRAY | Freq: Two times a day (BID) | RESPIRATORY_TRACT | 12 refills | Status: DC

## 2021-05-09 NOTE — Patient Instructions (Signed)
Vitamin B12 Deficiency Vitamin B12 deficiency means that your body does not have enough vitamin B12. The body needs this vitamin: To make red blood cells. To make genes (DNA). To help the nerves work. If you do not have enough vitamin B12 in your body, you can have healthproblems. What are the causes? Not eating enough foods that contain vitamin B12. Not being able to absorb vitamin B12 from the food that you eat. Certain digestive system diseases. A condition in which the body does not make enough of a certain protein, which results in too few red blood cells (pernicious anemia). Having a surgery in which part of the stomach or small intestine is removed. Taking medicines that make it hard for the body to absorb vitamin B12. These medicines include: Heartburn medicines. Some antibiotic medicines. Other medicines that are used to treat certain conditions. What increases the risk? Being older than age 97. Eating a vegetarian or vegan diet, especially while you are pregnant. Eating a poor diet while you are pregnant. Taking certain medicines. Having alcoholism. What are the signs or symptoms? In some cases, there are no symptoms. If the condition leads to too few blood cells or nerve damage, symptoms can occur, such as: Feeling weak. Feeling tired (fatigued). Not being hungry. Weight loss. A loss of feeling (numbness) or tingling in your hands and feet. Redness and burning of the tongue. Being mixed up (confused) or having memory problems. Sadness (depression). Problems with your senses. This can include color blindness, ringing in the ears, or loss of taste. Watery poop (diarrhea) or trouble pooping (constipation). Trouble walking. If anemia is very bad, symptoms can include: Being short of breath. Being dizzy. Having a very fast heartbeat. How is this treated? Changing the way you eat and drink, such as: Eating more foods that contain vitamin B12. Drinking little or no  alcohol. Getting vitamin B12 shots. Taking vitamin B12 supplements. Your doctor will tell you the dose that is best for you. Follow these instructions at home: Eating and drinking  Eat lots of healthy foods that contain vitamin B12. These include: Meats and poultry, such as beef, pork, chicken, [place], and organ meats, such as liver. Seafood, such as clams, rainbow trout, salmon, tuna, and haddock. Eggs. Cereal and dairy products that have vitamin B12 added to them. Check the label. The items listed above may not be a complete list of what you can eat and drink. Contact a dietitian for more options. General instructions Get any shots as told by your doctor. Take supplements only as told by your doctor. Do not drink alcohol if your doctor tells you not to. In some cases, you may only be asked to limit alcohol use. Keep all follow-up visits as told by your doctor. This is important. Contact a doctor if: Your symptoms come back. Get help right away if: You have trouble breathing. You have a very fast heartbeat. You have chest pain. You get dizzy. You pass out. Summary Vitamin B12 deficiency means that your body is not getting enough vitamin B12. In some cases, there are no symptoms of this condition. Treatment may include making a change in the way you eat and drink, getting vitamin B12 shots, or taking supplements. Eat lots of healthy foods that contain vitamin B12. This information is not intended to replace advice given to you by your health care provider. Make sure you discuss any questions you have with your healthcare provider. Document Revised: 06/09/2018 Document Reviewed: 06/09/2018 Elsevier Patient Education  2022  [employer]  Iron Deficiency Anemia, Adult Iron deficiency anemia is when you do not have enough red blood cells or hemoglobin in your blood. This happens because you have too little iron in your body. Hemoglobin carries oxygen to parts of the body. Anemia can  cause yourbody to not get enough oxygen. What are the causes? Not eating enough foods that have iron in them. The body not being able to take in iron well. Needing more iron due to pregnancy or heavy menstrual periods, for females. Cancer. Bleeding in the bowels. Many blood draws. What increases the risk? Being pregnant. Being a teenage girl going through a growth spurt. What are the signs or symptoms? Pale skin, lips, and nails. Weakness, dizziness, and getting tired easily. Headache. Feeling like you cannot breathe well when moving (shortness of breath). Cold hands and feet. Fast heartbeat or a heartbeat that is not regular. Feeling grouchy (irritable) or breathing fast. These are more common in very bad anemia. Mild anemia may not cause any symptoms. How is this treated? This condition is treated by finding out why you do not have enough iron and then getting more iron. It may include: Adding foods to your diet that have a lot of iron. Taking iron pills (supplements). If you are pregnant or breastfeeding, you may need to take extra iron. Your diet often does not provide the amount of iron that you need. Getting more vitamin C in your diet. Vitamin C helps your body take in iron. You may need to take iron pills with a glass of orange juice or vitamin C pills. Medicines to make heavy menstrual periods lighter. Surgery. You may need blood tests to see if treatment is working. If the treatment doesnot seem to be working, you may need more tests. Follow these instructions at home: Medicines Take over-the-counter and prescription medicines only as told by your doctor. This includes iron pills and vitamins. Take iron pills when your stomach is empty. If you cannot handle this, take them with food. Do not drink milk or take antacids at the same time as your iron pills. Iron pills may turn your poop (stool)black. If you cannot handle taking iron pills by mouth, ask your doctor about  getting iron through: An IV tube. A shot (injection) into a muscle. Eating and drinking  Talk with your doctor before changing the foods you eat. He or she may tell you to eat foods that have a lot of iron, such as: Liver. Low-fat (lean) beef. Breads and cereals that have iron added to them. Eggs. Dried fruit. Dark green, leafy vegetables. Eat fresh fruits and vegetables that are high in vitamin C. They help your body use iron. Foods with a lot of vitamin C include: Oranges. Peppers. Tomatoes. Mangoes. Drink enough fluid to keep your pee (urine) pale yellow.  Managing constipation If you are taking iron pills, they may cause trouble pooping (constipation). To prevent or treat trouble pooping, you may need to: Take over-the-counter or prescription medicines. Eat foods that are high in fiber. These include beans, whole grains, and fresh fruits and vegetables. Limit foods that are high in fat and sugar. These include fried or sweet foods. General instructions Return to your normal activities as told by your doctor. Ask your doctor what activities are safe for you. Keep yourself clean, and keep things clean around you. Keep all follow-up visits as told by your doctor. This is important. Contact a doctor if: You feel like you may vomit (nauseous), or  you vomit. You feel weak. You are sweating for no reason. You have trouble pooping, such as: Pooping less than 3 times a week. Straining to poop. Having poop that is hard, dry, or larger than normal. Feeling full or bloated. Pain in the lower belly. Not feeling better after pooping. Get help right away if: You pass out (faint). You have chest pain. You have trouble breathing that: Is very bad. Gets worse with physical activity. You have a fast heartbeat, or a heartbeat that does not feel regular. You get light-headed when getting up from sitting or lying down. These symptoms may be an emergency. Do not wait to see if the  symptoms will go away. Get medical help right away. Call your local emergency services (911 in the [place] Do not drive yourself to the hospital. Summary Iron deficiency anemia is when you have too little iron in your body. This condition is treated by finding out why you do not have enough iron in your body and then getting more iron. Take over-the-counter and prescription medicines only as told by your doctor. Eat fresh fruits and vegetables that are high in vitamin C. Get help right away if you cannot breathe well. This information is not intended to replace advice given to you by your health care provider. Make sure you discuss any questions you have with your healthcare provider. Document Revised: 06/08/2019 Document Reviewed: 06/08/2019 Elsevier Patient Education  [place].

## 2021-05-09 NOTE — Progress Notes (Signed)
Chief Complaint  Patient presents with   Follow-up   F/u  Chf stable and htn controlled on coreg 12.5 mg bid, lasix 20 mg qd, losartan 100 mg qd sch colonoscopy 9/16 and hysterectomy 07/06/21 will need cards clearance   B12 given today will do at home   Review of Systems  Constitutional:  Negative for weight loss.  HENT:  Negative for hearing loss.   Eyes:  Negative for blurred vision.  Respiratory:  Negative for shortness of breath.   Cardiovascular:  Negative for chest pain.  Gastrointestinal:  Negative for abdominal pain.  Genitourinary:        DUB  Musculoskeletal:  Negative for falls and joint pain.  Skin:  Negative for rash.  Psychiatric/Behavioral:  Negative for depression.   Past Medical History:  Diagnosis Date   Allergic rhinitis, seasonal    Bronchitis    Cardiomyopathy ([place])    post partum   CHF (congestive heart failure) ([place])    pregnancy related 2010   Endometriosis    Fibroid    History of CHF (congestive heart failure)    Hordeolum externum of left eye    HSV-2 infection    HTN, goal below 140/90    Left ear pain    Obesity, Class III, BMI 40-49.9 (morbid obesity) ([place])    Vitamin D deficiency    Past Surgical History:  Procedure Laterality Date   BREAST EXCISIONAL BIOPSY Right 1994   Negative   CESAREAN SECTION     x's 2   COLONOSCOPY  12/2008   Dr. Kenton Kingfisher, follow-up ASCUS   DILITATION & CURRETTAGE/HYSTROSCOPY WITH NOVASURE ABLATION N/A 01/22/2021   Procedure: DILATATION & CURETTAGE/HYSTEROSCOPY;  Surgeon: Benjaman Kindler, MD;  Location: [facility];  Service: Gynecology;  Laterality: N/A;   HYSTEROSCOPY N/A 11/13/2018   Procedure: HYSTEROSCOPY, remove/ replace Mirena;  Surgeon: Ward, Honor Loh, MD;  Location: [facility];  Service: Gynecology;  Laterality: N/A;   IUD REMOVAL N/A 01/22/2021   Procedure: INTRAUTERINE DEVICE (IUD) REMOVAL;  Surgeon: Benjaman Kindler, MD;  Location: [facility];  Service: Gynecology;  Laterality: N/A;   LAPAROSCOPIC BILATERAL  SALPINGECTOMY Bilateral 01/22/2021   Procedure: LAPAROSCOPIC BILATERAL SALPINGECTOMY;  Surgeon: Benjaman Kindler, MD;  Location: [facility];  Service: Gynecology;  Laterality: Bilateral;   Family History  Problem Relation Age of Onset   Cancer Mother        breast dx'ed in 50s    Breast cancer Mother 75   Hypertension Father    Diabetes Father    Cancer Father        prostate cancer    Cancer Maternal Grandmother        breast dx later in life    Breast cancer Maternal Grandmother    Breast cancer Paternal Grandmother    Cancer Paternal Grandmother        breast cancer dx'ed later in life    Social History   Socioeconomic History   Marital status: Unknown    Spouse name: Not on file   Number of children: Not on file   Years of education: Not on file   Highest education level: Not on file  Occupational History   Occupation: buyer for [employer]  Tobacco Use   Smoking status: Former    Packs/day: 0.25    Types: Cigarettes    Quit date: 11/13/2008    Years since quitting: 12.4   Smokeless tobacco: Never   Tobacco comments:    quit in 2010. Used to smoke 1/2 PPD.  Vaping Use   Vaping Use: Never used  Substance and Sexual Activity   Alcohol use: Yes    Alcohol/week: 0.0 standard drinks    Comment: occasional   Drug use: No   Sexual activity: Yes    Partners: Male    Birth control/protection: Condom, I.U.D.  Other Topics Concern   Not on file  Social History Narrative   Works [employer], former [employer] training    Social Determinants of Health   Financial Resource Strain: Not on file  Food Insecurity: Not on file  Transportation Needs: Not on file  Physical Activity: Not on file  Stress: Not on file  Social Connections: Not on file  Intimate Partner Violence: Not on file   Current Meds  Medication Sig   Cholecalciferol (VITAMIN D) 50 MCG (2000 UT) tablet Take 2,000 Units by mouth daily.   cyanocobalamin (,VITAMIN B-12,) 1000 MCG/ML injection Inject 1 mL (1,000 mcg total) into  the muscle every 30 (thirty) days.   Ferrous Sulfate (IRON) 325 (65 Fe) MG TABS Take by mouth.   loratadine (CLARITIN) 10 MG tablet TAKE 1 TABLET BY MOUTH EVERY DAY AS NEEDED   losartan (COZAAR) 100 MG tablet TAKE 1 TABLET BY MOUTH EVERY DAY   montelukast (SINGULAIR) 10 MG tablet TAKE 1 TABLET BY MOUTH EVERYDAY AT BEDTIME   Multiple Vitamin (MULTIVITAMIN WITH MINERALS) TABS tablet Take 1 tablet by mouth daily.   Polyethylene Glycol 400 (BLINK TEARS) 0.25 % SOLN Place 1 drop into both eyes daily as needed (dry eyes).   Probiotic Product (PROBIOTIC-10) CAPS Take 1 capsule by mouth daily.    Syringe/Needle, Disp, 25G X 1-1/2" 5 ML MISC 1 Device by Does not apply route every 30 (thirty) days.   [DISCONTINUED] albuterol (VENTOLIN HFA) 108 (90 Base) MCG/ACT inhaler Inhale 1-2 puffs into the lungs every 6 (six) hours as needed for wheezing or shortness of breath.   [DISCONTINUED] budesonide-formoterol (SYMBICORT) 80-4.5 MCG/ACT inhaler Inhale 2 puffs into the lungs 2 (two) times daily. Rinse mouth out (Patient taking differently: Inhale 2 puffs into the lungs 2 (two) times daily as needed (shortness of breath). Rinse mouth out)   [DISCONTINUED] carvedilol (COREG) 12.5 MG tablet Take 1 tablet (12.5 mg total) by mouth 2 (two) times daily with a meal.   [DISCONTINUED] fluticasone (FLONASE) 50 MCG/ACT nasal spray Place 2 sprays into both nostrils daily. (Patient taking differently: Place 2 sprays into both nostrils daily as needed for allergies.)   [DISCONTINUED] furosemide (LASIX) 20 MG tablet Take 1 tablet (20 mg total) by mouth daily.   Current Facility-Administered Medications for the 05/09/21 encounter (Office Visit) with McLean-Scocuzza, Nino Glow, MD  Medication   cyanocobalamin ((VITAMIN B-12)) injection 1,000 mcg   Allergies  Allergen Reactions   Penicillin V Rash   No results found for this or any previous visit (from the past 2160 hour(s)). Objective  Body mass index is 47.36 kg/m. Wt  Readings from Last 3 Encounters:  05/09/21 293 lb 6.4 oz (133.1 kg)  01/19/21 289 lb (131.1 kg)  11/09/20 291 lb (132 kg)   Temp Readings from Last 3 Encounters:  05/09/21 98.1 F (36.7 C) (Oral)  02/02/21 (!) 96.6 F (35.9 C) (Temporal)  01/22/21 (!) 96.9 F (36.1 C) (Temporal)   BP Readings from Last 3 Encounters:  05/09/21 124/80  01/22/21 125/61  11/09/20 130/80   Pulse Readings from Last 3 Encounters:  05/09/21 84  01/22/21 65  11/09/20 69    Physical Exam Vitals and nursing note reviewed.  Constitutional:      Appearance: Normal appearance. She is well-developed and well-groomed. She is morbidly obese.  HENT:     Head: Normocephalic and atraumatic.  Eyes:     Conjunctiva/sclera: Conjunctivae normal.     Pupils: Pupils are equal, round, and reactive to light.  Cardiovascular:     Rate and Rhythm: Normal rate and regular rhythm.     Heart sounds: Normal heart sounds. No murmur heard. Pulmonary:     Effort: Pulmonary effort is normal.     Breath sounds: Normal breath sounds.  Skin:    General: Skin is warm and dry.  Neurological:     General: No focal deficit present.     Mental Status: She is alert and oriented to person, place, and time. Mental status is at baseline.     Gait: Gait normal.  Psychiatric:        Attention and Perception: Attention and perception normal.        Mood and Affect: Mood and affect normal.        Speech: Speech normal.        Behavior: Behavior normal. Behavior is cooperative.        Thought Content: Thought content normal.        Cognition and Memory: Cognition and memory normal.        Judgment: Judgment normal.    Assessment  Plan  B12 deficiency - Plan: Vitamin B12, cyanocobalamin ((VITAMIN B-12)) injection 1,000 mcg, cyanocobalamin (,VITAMIN B-12,) 1000 MCG/ML injection, Syringe/Needle, Disp, 25G X 1-1/2" 5 ML MISC  Primary hypertension - Plan: Comprehensive metabolic panel, Lipid panel, CBC with  Differential/Platelet  Iron deficiency anemia, likely due to DUB sch hysterecomty 07/06/21 Dr. Leafy Ro - Plan: CBC with Differential/Platelet, Iron, TIBC and Ferritin Panel  Pre-operative clearance - Plan: Urinalysis, Routine w reflex microscopic  Prediabetes - Plan: Hemoglobin A1c  Vitamin D deficiency - Plan: Vitamin D (25 hydroxy)  Bronchitis - Plan: budesonide-formoterol (SYMBICORT) 80-4.5 MCG/ACT inhaler, albuterol (VENTOLIN HFA) 108 (90 Base) MCG/ACT inhaler Cough - Plan: albuterol (VENTOLIN HFA) 108 (90 Base) MCG/ACT inhaler wheezing - Plan: albuterol (VENTOLIN HFA) 108 (90 Base) MCG/ACT inhaler Allergic rhinitis, unspecified seasonality, unspecified trigger - Plan: fluticasone (FLONASE) 50 MCG/ACT nasal spray   HM Flu will do in future  Tdap and hep Bx 3 doses utd, hep B immune covid 3/3  pfizer consider booster    [facility] pap 11/06/18 neg neg HPV  May do ablation if fails hysterectomy as of 11/09/20 plan hysterectomy 07/06/21    mammo 09/24/18 neg insurance would not cover genetic testing for breast cancer as mom and both grandmothers dx'ed breast cancer at older age per pt  -mammo neg 11/15/19 sch 11/27/20 negative    Dad h/o prostate cancer -referred colonoscopy age 20 having 12/03/19  -referred [facility] 06/29/21    -established dermatology 2 lesions behind left ear appt 10/2018, has not seen     cards 08/2021 dr. Clayborn Bigness   rec healthy diet and exercise  Provider: Dr. Olivia Mackie McLean-Scocuzza-Internal Medicine

## 2021-06-07 ENCOUNTER — Telehealth: Payer: Self-pay

## 2021-06-07 NOTE — Telephone Encounter (Signed)
I think would be good to see her in the office first in light of her medical problems rather than a direct colonoscopy at the hospital, if you can help coordinate an office visit. Thanks

## 2021-06-07 NOTE — Telephone Encounter (Addendum)
Pt noted to be a difficult intubation per notes below.  She also has BMI of 47.38, cardiomyopathy and old hx of CHF.  Last echo done 2019 with EF 55-65 with no AVS  Procedure Name: Intubation Date/Time: 01/22/2021 9:27 AM Performed by: Joellyn Quails, RN Pre-anesthesia Checklist: Patient identified, Emergency Drugs available, Suction available and Patient being monitored Patient Re-evaluated:Patient Re-evaluated prior to induction Oxygen Delivery Method: Circle system utilized Preoxygenation: Pre-oxygenation with 100% oxygen Induction Type: IV induction Ventilation: Mask ventilation without difficulty Laryngoscope Size: McGraph and 4 Grade View: Grade II Tube type: Oral Tube size: 7.0 mm Number of attempts: 2 Airway Equipment and Method: Stylet,  Oral airway and Video-laryngoscopy Placement Confirmation: ETT inserted through vocal cords under direct vision,  positive ETCO2 and breath sounds checked- equal and bilateral Secured at: 20 cm Tube secured with: Tape Dental Injury: Teeth and Oropharynx as per pre-operative assessment  Comments: Initial attempt DVL with MAC 4, grade 3 view. Successful intubation with McGraph 4.

## 2021-06-07 NOTE — Telephone Encounter (Signed)
Per Dr. Havery Moros, pt called and scheduled an OV with [facility] on 9/28.

## 2021-06-28 ENCOUNTER — Other Ambulatory Visit: Payer: Self-pay | Admitting: Family

## 2021-06-28 ENCOUNTER — Ambulatory Visit (INDEPENDENT_AMBULATORY_CARE_PROVIDER_SITE_OTHER): Payer: BC Managed Care – PPO

## 2021-06-28 ENCOUNTER — Other Ambulatory Visit: Payer: Self-pay

## 2021-06-28 DIAGNOSIS — E538 Deficiency of other specified B group vitamins: Secondary | ICD-10-CM

## 2021-06-28 DIAGNOSIS — E559 Vitamin D deficiency, unspecified: Secondary | ICD-10-CM

## 2021-06-28 DIAGNOSIS — R7303 Prediabetes: Secondary | ICD-10-CM

## 2021-06-28 DIAGNOSIS — D509 Iron deficiency anemia, unspecified: Secondary | ICD-10-CM

## 2021-06-28 DIAGNOSIS — Z23 Encounter for immunization: Secondary | ICD-10-CM | POA: Diagnosis not present

## 2021-06-28 DIAGNOSIS — I10 Essential (primary) hypertension: Secondary | ICD-10-CM

## 2021-06-28 DIAGNOSIS — Z01818 Encounter for other preprocedural examination: Secondary | ICD-10-CM

## 2021-06-28 LAB — CBC WITH DIFFERENTIAL/PLATELET
Basophils Absolute: 0.1 10*3/uL (ref 0.0–0.1)
Basophils Relative: 1 % (ref 0.0–3.0)
Eosinophils Absolute: 0.2 10*3/uL (ref 0.0–0.7)
Eosinophils Relative: 4.1 % (ref 0.0–5.0)
HCT: 36.2 % (ref 36.0–46.0)
Hemoglobin: 11.6 g/dL — ABNORMAL LOW (ref 12.0–15.0)
Lymphocytes Relative: 30 % (ref 12.0–46.0)
Lymphs Abs: 1.7 10*3/uL (ref 0.7–4.0)
MCHC: 32 g/dL (ref 30.0–36.0)
MCV: 92.6 fl (ref 78.0–100.0)
Monocytes Absolute: 0.5 10*3/uL (ref 0.1–1.0)
Monocytes Relative: 8.4 % (ref 3.0–12.0)
Neutro Abs: 3.2 10*3/uL (ref 1.4–7.7)
Neutrophils Relative %: 56.5 % (ref 43.0–77.0)
Platelets: 226 10*3/uL (ref 150.0–400.0)
RBC: 3.91 Mil/uL (ref 3.87–5.11)
RDW: 15.8 % — ABNORMAL HIGH (ref 11.5–15.5)
WBC: 5.6 10*3/uL (ref 4.0–10.5)

## 2021-06-28 LAB — LIPID PANEL
Cholesterol: 168 mg/dL (ref 0–200)
HDL: 69.5 mg/dL (ref >39.00)
LDL Cholesterol: 91 mg/dL (ref 0–99)
NonHDL: 98.41
Total CHOL/HDL Ratio: 2
Triglycerides: 39 mg/dL (ref 0.0–149.0)
VLDL: 7.8 mg/dL (ref 0.0–40.0)

## 2021-06-28 LAB — COMPREHENSIVE METABOLIC PANEL
ALT: 15 U/L (ref 0–35)
AST: 15 U/L (ref 0–37)
Albumin: 4 g/dL (ref 3.5–5.2)
Alkaline Phosphatase: 70 U/L (ref 39–117)
BUN: 13 mg/dL (ref 6–23)
CO2: 25 mEq/L (ref 19–32)
Calcium: 9.2 mg/dL (ref 8.4–10.5)
Chloride: 106 mEq/L (ref 96–112)
Creatinine, Ser: 0.85 mg/dL (ref 0.40–1.20)
GFR: 81.32 mL/min (ref >60.00)
Glucose, Bld: 101 mg/dL — ABNORMAL HIGH (ref 70–99)
Potassium: 3.8 mEq/L (ref 3.5–5.1)
Sodium: 138 mEq/L (ref 135–145)
Total Bilirubin: 0.7 mg/dL (ref 0.2–1.2)
Total Protein: 6.9 g/dL (ref 6.0–8.3)

## 2021-06-28 LAB — VITAMIN D 25 HYDROXY (VIT D DEFICIENCY, FRACTURES): VITD: 23.46 ng/mL — ABNORMAL LOW (ref 30.00–100.00)

## 2021-06-28 LAB — VITAMIN B12: Vitamin B-12: 324 pg/mL (ref 211–911)

## 2021-06-28 LAB — HEMOGLOBIN A1C: Hgb A1c MFr Bld: 6.2 % (ref 4.6–6.5)

## 2021-06-28 MED ORDER — VITAMIN D (ERGOCALCIFEROL) 1.25 MG (50000 UNIT) PO CAPS: 50000.0000 [IU] | ORAL_CAPSULE | ORAL | 0 refills | Status: DC

## 2021-06-29 ENCOUNTER — Encounter: Payer: BC Managed Care – PPO | Admitting: Gastroenterology

## 2021-06-29 ENCOUNTER — Other Ambulatory Visit: Payer: BC Managed Care – PPO

## 2021-06-29 LAB — URINALYSIS, ROUTINE W REFLEX MICROSCOPIC
Bilirubin Urine: NEGATIVE
Glucose, UA: NEGATIVE
Hgb urine dipstick: NEGATIVE
Ketones, ur: NEGATIVE
Leukocytes,Ua: NEGATIVE
Nitrite: NEGATIVE
Protein, ur: NEGATIVE
Specific Gravity, Urine: 1.023 (ref 1.001–1.035)
pH: <=5 (ref 5.0–8.0)

## 2021-06-29 LAB — IRON,TIBC AND FERRITIN PANEL
%SAT: 12 % (calc) — ABNORMAL LOW (ref 16–45)
Ferritin: 17 ng/mL (ref 16–232)
Iron: 40 ug/dL (ref 40–190)
TIBC: 342 mcg/dL (calc) (ref 250–450)

## 2021-07-03 NOTE — H&P (Signed)
Chief Complaint:   Heather Case is a 50 y.o. female presenting with Pre Op Consulting    History of Present Illness: Pt presents today for preoperative exam prior to her scheduled hysterectomy. She underwent an uncomplicated  Exam Under Anesthesia, Laparoscopic Bilateral Tubal Ligation, Fractional D&C w/ ECC, Hysteroscopy, IUD Removal, LOA, Excision of Endometriosis on 01/22/2021. Indications for surgery: menorrhagia, dysmenorrhea, failed medical management with IUD and OCPs, chronic medical conditions affecting care, undesired fertility. We had planned for IUD removal with Novasure ablation, but the finding of a large submucosal fibroid precluded Myosure removal in a single setting and possible adenomyosis.  The options left for control of her menorrhagia after failed IUD and failed endometrial ablation is a hysterectomy.   Workup: Pap: 10/2018 neg/neg EMBx 11/2020: Fragments of benign lower uterine segment endometrium and endocervical epithelium admixed with mucus and blood. TVUS 10/2019: Uterus anteverted, 10.4 cm x 4.4 cm x 5.7 cm; Endometrium=5.25mm IUD seen posterior & Rt of endometrium Free fluid seen in PCDS Lt simple ovarian cyst=2.7cm;  Rt complex septated ovarian cyst=2.6cm; septation=0.34cm *limited visualization due to body habitus  Pathology: 01/2021 Fallopian tubes, bilateral: -Complete cross-sections of benign fallopian tube (2) and benign paratubal cysts  Uterus, endocervical curettings:  -Benign ectocervical mucosa & polypoid endocervical mucosa with chronic endocervicitis. -Negative for dysplasia & malignancy  Uterus, endometrium; curettings: -Myometrial fragments compatible with leiomyoma. Benign endometrium with tubal metaplasia.  -Benign ectocervical and and endocervical mucosa Specimen labeled endometriosis excision:  -Benign fallopian tube segment with fibrous adhesions.  Past Medical History:  has a past medical history of CHF (congestive heart failure)  (CMS-HCC), History of abnormal cervical Pap smear, Hypertension, Obesity, Postpartum cardiomyopathy, and Shortness of breath.  Past Surgical History:  has a past surgical history that includes IUD insertion (12/2013); Mastectomy partial / lumpectomy (Right, 1993); Cesarean section; Colposcopy; Hysteroscopy (11/2018); Combined hysteroscopy diagnostic / D&C; Tubal ligation (2022); Hysteroscopy (2022); lysis of adhesions (2022); Dilation and curettage of uterus (2022); and excision of endometriosis (2022). Family History: family history includes Breast cancer in her maternal grandmother, mother, and paternal grandmother; High blood pressure (Hypertension) in her father. Social History:  reports that she has never smoked. She has never used smokeless tobacco. She reports current alcohol use. She reports that she does not use drugs. OB/GYN History:  OB History     Gravida  2   Para  2   Term  2   Preterm      AB      Living  2      SAB      IAB      Ectopic      Molar      Multiple      Live Births  2         Allergies: is allergic to penicillins. Medications: Current Outpatient Medications:    albuterol 90 mcg/actuation inhaler, INHALE 1 2 PUFFS INTO THE LUNGS EVERY 6 (SIX) HOURS AS NEEDED FOR WHEEZING OR SHORTNESS OF BREATH., Disp: , Rfl:    carvediloL (COREG) 12.5 MG tablet, TAKE 1 TABLET BY MOUTH TWICE A DAY WITH MEALS, Disp: 180 tablet, Rfl: 0   cholecalciferol, vitamin D3, 125 mcg (5,000 unit) Tab, Take by mouth, Disp: , Rfl:    cyanocobalamin (VITAMIN B12) 1,000 mcg/mL injection, INJECT 1 ML (1,000 MCG TOTAL) INTO THE MUSCLE EVERY 30 DAYS., Disp: , Rfl:    ergocalciferol, vitamin D2, 50,000 unit capsule, Take 50,000 Units by mouth once a week, Disp: , Rfl:  fluticasone (FLONASE) 50 mcg/actuation nasal spray, Place 1 spray into both nostrils 2 (two) times daily, Disp: 16 g, Rfl: 0   FUROsemide (LASIX) 20 MG tablet, Take 1 tablet (20 mg total) by mouth once daily,  Disp: 90 tablet, Rfl: 3   Lacto no.41-B.bifid-B.animalis (ADVANCED PROBIOTIC-10) 13 mg (3 billion cell) Cap, Take by mouth, Disp: , Rfl:    loratadine (CLARITIN) 10 mg tablet, TAKE 1 TABLET BY MOUTH EVERY DAY, Disp: 90 tablet, Rfl: 0   losartan (COZAAR) 100 MG tablet, Take by mouth once daily, Disp: , Rfl:    SYMBICORT 80-4.5 mcg/actuation inhaler, INHALE 2 PUFFS INTO THE LUNGS 2 (TWO) TIMES DAILY. RINSE MOUTH OUT, Disp: , Rfl:    tranexamic acid (LYSTEDA) 650 mg tablet, TAKE 2 TABLETS BY MOUTH 3 TIMES DAILY TAKE FOR A MAXIMUM OF 5 DAYS DURING MONTHLY MENSTRUATION., Disp: 30 tablet, Rfl: 3   VANISHPOINT SYRINGE 1 mL 25 gauge x 1" syringe, 1 DEVICE BY DOES NOT APPLY ROUTE EVERY 30 (THIRTY) DAYS., Disp: , Rfl:    aspirin 81 MG EC tablet, Take 81 mg by mouth once daily. (Patient not taking: Reported on 07/03/2021), Disp: , Rfl:    chlorhexidine (PERIDEX) 0.12 % solution, as directed (Patient not taking: No sig reported), Disp: , Rfl:    gabapentin (NEURONTIN) 800 MG tablet, , Disp: , Rfl:    ibuprofen (MOTRIN) 800 MG tablet, , Disp: , Rfl:    levonorgestreL (MIRENA 52 MG) 20 mcg/24 hr (6 years) IUD, Insert 1 each into the uterus once Follow package directions. (Patient not taking: Reported on 07/03/2021), Disp: , Rfl:    meloxicam (MOBIC) 15 MG tablet, daily (Patient not taking: No sig reported), Disp: , Rfl:    montelukast (SINGULAIR) 10 mg tablet, Take by mouth (Patient not taking: No sig reported), Disp: , Rfl:    norethindrone (AYGESTIN) 5 mg tablet, Take 1 tablet (5 mg total) by mouth once daily for 10 days, Disp: 10 tablet, Rfl: 0   norgestimate-ethinyl estradioL (SPRINTEC 0.25/35, 28,) 0.25-35 mg-mcg tablet, Take 1 tab po daily, skip placebo pills and take continuously (Patient not taking: Reported on 07/03/2021), Disp: 84 tablet, Rfl: 3   oxyCODONE (ROXICODONE) 5 MG immediate release tablet, , Disp: , Rfl:    triamcinolone 0.1 % cream, , Disp: , Rfl:    valACYclovir (VALTREX) 1000 MG tablet, Take  by mouth (Patient not taking: No sig reported), Disp: , Rfl:   Review of Systems: No SOB, no palpitations or chest pain, no new lower extremity edema, no nausea or vomiting or bowel or bladder complaints. See HPI for gyn specific ROS.   Exam:   BP 130/82   Ht 167.6 cm (5\' 6" )   Wt (!) 133.9 kg (295 lb 3.2 oz)   LMP 07/02/2021   BMI 47.65 kg/m   General: Patient is well-groomed, well-nourished, appears stated age in no acute distress   HEENT: head is atraumatic and normocephalic, trachea is midline, neck is supple with no palpable nodules   CV: Regular rhythm and normal heart rate, no murmur   Pulm: Clear to auscultation throughout lung fields with no wheezing, crackles, or rhonchi. No increased work of breathing  Abdomen: soft , no mass, non-tender, no rebound tenderness, no hepatomegaly  Pelvic: tanner stage 5 ,   External genitalia: vulva /labia no lesions  Urethra: no prolapse  Vagina: normal physiologic d/c, laxity in vaginal walls  Cervix: no lesions, no cervical motion tenderness, good descent  Uterus: normal size shape and  contour, non-tender  Adnexa: no mass,  non-tender    Rectovaginal: External wnl  Impression:   The primary encounter diagnosis was Excessive or frequent menstruation. Diagnoses of Uterine leiomyoma, unspecified location and Dysmenorrhea were also pertinent to this visit.  Plan:   1.  -Patient returns for a preoperative discussion regarding her plans to proceed with surgical treatment of her  menorrhagia, dysmenorrhea, failed medical management with IUD and OCPs by total laparoscopic hysterectomy with bilateral salpingectomy  procedure.  We may perform a cystoscopy to evaluate the urinary tract after the procedure, if surgically indicated for uro tract integrity.   The patient and I discussed the technical aspects of the procedure including the potential for risks and complications.  These include but are not limited to the risk of infection  requiring post-operative antibiotics or further procedures.  We talked about the risk of injury to adjacent organs including bladder, bowel, ureter, blood vessels or nerves.  We talked about the need to convert to an open incision.  We talked about the possible need for blood transfusion.  We talked about postop complications such as thromboembolic or cardiopulmonary complications.  All of her questions were answered.  Her preoperative exam was completed and the appropriate consents were signed. She is scheduled to undergo this procedure in the near future.  Specific Peri-operative Considerations:  - Consent: obtained today - Health Maintenance:  - Labs: CBC, CMP preoperatively - Studies: EKG, CXR preoperatively - Bowel Preparation: None required - Abx:  Ancef 3g - VTE ppx: SCDs perioperatively - Glucose Protocol: n/a  Diagnoses and all orders for this visit:  Excessive or frequent menstruation  Uterine leiomyoma, unspecified location  Dysmenorrhea    Return for Postop check.  ~~~~~~~~~~~~~~~~~~~~~~~~~~~~~~~~~~~~~~~~~~~~~~~~~~~~~~~~~~~~ This note is partially written by Geraldine Solar, in the presence of and acting as the scribe of Dr. Benjaman Kindler, who has reviewed, edited and added to the note to reflect her best personal medical judgment.  This note was generated in part with voice recognition software and I apologize for any typographical errors that were not detected and corrected.    Sherrie George, MD

## 2021-07-10 ENCOUNTER — Other Ambulatory Visit: Payer: Self-pay

## 2021-07-10 ENCOUNTER — Encounter
Admission: RE | Admit: 2021-07-10 | Discharge: 2021-07-10 | Disposition: A | Discharge status: Home / Self Care | Payer: BC Managed Care – PPO | Source: Ambulatory Visit | Attending: Obstetrics and Gynecology | Admitting: Obstetrics and Gynecology

## 2021-07-10 NOTE — Patient Instructions (Addendum)
Your procedure is scheduled on: Friday, October 7 Report to the Registration Desk on the 1st floor of the [facility]. To find out your arrival time, please call [phone] between 1PM - 3PM on: Thursday, October 6  REMEMBER: Instructions that are not followed completely may result in serious medical risk, up to and including death; or upon the discretion of your surgeon and anesthesiologist your surgery may need to be rescheduled.  Do not eat food after midnight the night before surgery.  No gum chewing, lozengers or hard candies.  You may however, drink CLEAR liquids up to 2 hours before you are scheduled to arrive for your surgery. Do not drink anything within 2 hours of your scheduled arrival time.  Clear liquids include: - water  - apple juice without pulp - gatorade (not RED, PURPLE, OR BLUE) - black coffee or tea (Do NOT add milk or creamers to the coffee or tea) Do NOT drink anything that is not on this list.  TAKE THESE MEDICATIONS THE MORNING OF SURGERY WITH A SIP OF WATER:  Albuterol inhaler Symbicort inhaler Carvedilol  Use inhalers on the day of surgery and bring to the hospital.  One week prior to surgery: starting September 30 Stop Anti-inflammatories (NSAIDS) such as Advil, Aleve, Ibuprofen, Motrin, Naproxen, Naprosyn and Aspirin based products such as Excedrin, Goodys Powder, BC Powder. Stop ANY OVER THE COUNTER supplements until after surgery. Stop probiotic, vitamin D, multiple vitamin. You may however, continue to take Tylenol if needed for pain up until the day of surgery.  No Alcohol for 24 hours before or after surgery.  No Smoking including e-cigarettes for 24 hours prior to surgery.  No chewable tobacco products for at least 6 hours prior to surgery.  No nicotine patches on the day of surgery.  Do not use any "recreational" drugs for at least a week prior to your surgery.  Please be advised that the combination of cocaine and anesthesia may have  negative outcomes, up to and including death. If you test positive for cocaine, your surgery will be cancelled.  On the morning of surgery brush your teeth with toothpaste and water, you may rinse your mouth with mouthwash if you wish. Do not swallow any toothpaste or mouthwash.  Use CHG Soap as directed on instruction sheet.  Do not wear jewelry, make-up, hairpins, clips or nail polish.  Do not wear lotions, powders, or perfumes.   Do not shave body from the neck down 48 hours prior to surgery just in case you cut yourself which could leave a site for infection.  Also, freshly shaved skin may become irritated if using the CHG soap.  Do not bring valuables to the hospital. [facility] is not responsible for any missing/lost belongings or valuables.   Notify your doctor if there is any change in your medical condition (cold, fever, infection).  Wear comfortable clothing (specific to your surgery type) to the hospital.  After surgery, you can help prevent lung complications by doing breathing exercises.  Take deep breaths and cough every 1-2 hours. Your doctor may order a device called an Incentive Spirometer to help you take deep breaths. When coughing or sneezing, hold a pillow firmly against your incision with both hands. This is called "splinting." Doing this helps protect your incision. It also decreases belly discomfort.  If you are being discharged the day of surgery, you will not be allowed to drive home. You will need a responsible adult (18 years or older) to drive  you home and stay with you that night.   If you are taking public transportation, you will need to have a responsible adult (18 years or older) with you. Please confirm with your physician that it is acceptable to use public transportation.   Please call the [facility] at [phone] if you have any questions about these instructions.  Surgery Visitation Policy:  Patients undergoing a surgery  or procedure may have one family member or support person with them as long as that person is not COVID-19 positive or experiencing its symptoms.  That person may remain in the waiting area during the procedure and may rotate out with other people.

## 2021-07-11 ENCOUNTER — Ambulatory Visit (INDEPENDENT_AMBULATORY_CARE_PROVIDER_SITE_OTHER): Payer: BC Managed Care – PPO | Admitting: Nurse Practitioner

## 2021-07-11 ENCOUNTER — Encounter: Payer: Self-pay | Admitting: Nurse Practitioner

## 2021-07-11 VITALS — BP 126/62 | HR 86 | Ht 66.0 in | Wt 294.0 lb

## 2021-07-11 DIAGNOSIS — Z1211 Encounter for screening for malignant neoplasm of colon: Secondary | ICD-10-CM | POA: Diagnosis not present

## 2021-07-11 DIAGNOSIS — O903 Peripartum cardiomyopathy: Secondary | ICD-10-CM | POA: Diagnosis not present

## 2021-07-11 NOTE — Progress Notes (Signed)
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  She presents her office today as referred by Heather Case to schedule a screening colonoscopy.  She denies having any upper or lower abdominal pain.  She is passing a normal formed brown bowel movement daily.  No rectal bleeding or black stools.  No known family history of colon polyps or colorectal cancer.  She has a history of IDA associated with menorrhagia. She underwent laparoscopic bilateral tubal ligation, hysteroscopy, extensive adhesions and IUD removal 01/22/2021, during this surgery there was notation of a difficult intubation.  She was scheduled for a screening colonoscopy in our outpatient center, however, due to her history of difficult intubation her colonoscopy was canceled and she presents today to reschedule this procedure at [facility].  She is scheduled for a laparoscopic hysterectomy with salpingectomy on 07/20/2021 therefore a colonoscopy will be scheduled when she has completely recovered from this surgery.  She stated the colonoscopy was elective, for screening purposes only and not a requirement prior to proceeding with her hysterectomy salpingo-oophorectomy surgery.  She has a history of postpartum cardiomyopathy followed by cardiologist Heather Case.  Her last echo 01/2018 showed a LVEF 55 to 65%.  She remains on Carvedilol, Losartan and Furosemide.  She previously had mild dyspnea during exercise which has resolved.  She is due to see Heather Case 08/2021 for her annual visit.  ECHO 02/05/2018: - Left ventricle: The cavity size was normal. Wall thickness was    normal. Systolic function was normal. The estimated ejection    fraction was in the range of 55%  to 65%.  - Aortic valve: Valve area (Vmax): 2.35 cm^2.   CBC Latest Ref Rng & Units 06/28/2021 01/24/2021 01/22/2021  WBC 4.0 - 10.5 K/uL 5.6 6.4 5.7  Hemoglobin 12.0 - 15.0 g/dL 11.6(L) 10.3(L) 10.2(L)  Hematocrit 36.0 - 46.0 % 36.2 31.0(L) 31.5(L)  Platelets 150.0 - 400.0 K/uL 226.0 260.0 231    CMP Latest Ref Rng & Units 06/28/2021 12/11/2020 11/06/2020  Glucose 70 - 99 mg/dL 101(H) - 99  BUN 6 - 23 mg/dL 13 - 10  Creatinine 0.40 - 1.20 mg/dL 0.85 - 0.95  Sodium 135 - 145 mEq/L 138 - 139  Potassium 3.5 - 5.1 mEq/L 3.8 - 4.2  Chloride 96 - 112 mEq/L 106 - 103  CO2 19 - 32 mEq/L 25 - 27  Calcium 8.4 - 10.5 mg/dL 9.2 - 9.4  Total Protein 6.0 - 8.3 g/dL 6.9 6.6 7.0  Total Bilirubin 0.2 - 1.2 mg/dL 0.7 0.6 0.8  Alkaline Phos 39 - 117 U/L 70 52 60  AST 0 - 37 U/L 15 20 36  ALT 0 - 35 U/L 15 20 39(H)     Past Medical History:  Diagnosis Date   Allergic rhinitis, seasonal    Bronchitis    Cardiomyopathy ([place])    post partum   CHF (congestive heart failure) ([place])    pregnancy related 2010   Endometriosis    Fibroid    History of CHF (congestive heart failure)    Hordeolum externum of left eye    HSV-2 infection    HTN, goal below 140/90    Left ear pain    Obesity, Class III, BMI 40-49.9 (morbid obesity) (HCC)    Vitamin  D deficiency    Past Surgical History:  Procedure Laterality Date   BREAST EXCISIONAL BIOPSY Right 1994   Negative   CESAREAN SECTION     x's 2   COLPOSCOPY  12/2008   Dr. Kenton Case, follow-up ASCUS   DILITATION & CURRETTAGE/HYSTROSCOPY WITH NOVASURE ABLATION N/A 01/22/2021   Procedure: DILATATION & CURETTAGE/HYSTEROSCOPY;  Surgeon: Heather Kindler, MD;  Location: [facility] OR[facility];  Service: Gynecology;  Laterality: N/A;   HYSTEROSCOPY N/A 11/13/2018   Procedure: HYSTEROSCOPY, remove/ replace Mirena;  Surgeon: Ward, Honor Loh, MD;  Location: [facility] OR[facility];  Service: Gynecology;  Laterality: N/A;   INTRAUTERINE DEVICE INSERTION  12/2013   IUD REMOVAL N/A 01/22/2021    Procedure: INTRAUTERINE DEVICE (IUD) REMOVAL;  Surgeon: Heather Kindler, MD;  Location: [facility];  Service: Gynecology;  Laterality: N/A;   LAPAROSCOPIC BILATERAL SALPINGECTOMY Bilateral 01/22/2021   Procedure: LAPAROSCOPIC BILATERAL SALPINGECTOMY;  Surgeon: Heather Kindler, MD;  Location: [facility] OR[facility];  Service: Gynecology;  Laterality: Bilateral;   Social History: She is divorced.  She has 1 son and 1 daughter.  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  She quit smoking cigarettes 12 years ago.  No alcohol use.  No drug use.  Family History: Mother, maternal grandmother and paternal grandmother all had breast cancer.  Father with prostate cancer diabetes and hypertension.  No known family history of esophageal, gastric or colorectal cancer.  Allergies  Allergen Reactions   Penicillin V Rash      Outpatient Encounter Medications as of 07/11/2021  Medication Sig   albuterol (VENTOLIN HFA) 108 (90 Base) MCG/ACT inhaler Inhale 1-2 puffs into the lungs every 6 (six) hours as needed for wheezing or shortness of breath.   budesonide-formoterol (SYMBICORT) 80-4.5 MCG/ACT inhaler Inhale 2 puffs into the lungs 2 (two) times daily. Rinse mouth out   carvedilol (COREG) 12.5 MG tablet Take 1 tablet (12.5 mg total) by mouth 2 (two) times daily with a meal.   cyanocobalamin (,VITAMIN B-12,) 1000 MCG/ML injection Inject 1 mL (1,000 mcg total) into the muscle every 30 (thirty) days.   Ferrous Sulfate (IRON) 325 (65 Fe) MG TABS Take by mouth.   fluticasone (FLONASE) 50 MCG/ACT nasal spray Place 2 sprays into both nostrils daily.   furosemide (LASIX) 20 MG tablet Take 1 tablet (20 mg total) by mouth daily.   loratadine (CLARITIN) 10 MG tablet TAKE 1 TABLET BY MOUTH EVERY DAY AS NEEDED   losartan (COZAAR) 100 MG tablet TAKE 1 TABLET BY MOUTH EVERY DAY   montelukast (SINGULAIR) 10 MG tablet TAKE 1 TABLET BY MOUTH EVERYDAY AT BEDTIME   Multiple Vitamin (MULTIVITAMIN WITH MINERALS) TABS tablet Take 1 tablet by mouth daily.    Polyethylene Glycol 400 (BLINK TEARS) 0.25 % SOLN Place 1 drop into both eyes daily as needed (dry eyes).   Probiotic Product (PROBIOTIC-10) CAPS Take 1 capsule by mouth daily.    Syringe/Needle, Disp, 25G X 1-1/2" 5 ML MISC 1 Device by Does not apply route every 30 (thirty) days.   Vitamin D, Ergocalciferol, (DRISDOL) 1.25 MG (50000 UNIT) CAPS capsule Take 1 capsule (50,000 Units total) by mouth every 7 (seven) days. (Patient taking differently: Take 50,000 Units by mouth every 7 (seven) days. Monday)   No facility-administered encounter medications on file as of 07/11/2021.   REVIEW OF SYSTEMS:  Gen: Denies fever, sweats or chills. No weight loss.  CV: Denies chest pain, palpitations or edema. Resp: Denies cough, shortness of breath of hemoptysis.  GI: Denies heartburn, dysphagia, stomach or lower abdominal pain. No  diarrhea or constipation.  GU : Denies urinary burning, blood in urine, increased urinary frequency or incontinence. MS: Denies joint pain, muscles aches or weakness. Derm: Denies rash, itchiness, skin lesions or unhealing ulcers. Psych: Denies depression, anxiety relapse. Heme: Denies bruising, bleeding. Neuro:  Denies headaches, dizziness or paresthesias. Endo:  Denies any problems with DM, thyroid or adrenal function.  PHYSICAL EXAM: LMP 07/02/2021  BP 126/62   Pulse 86   Ht 5\' 6"  (1.676 m)   Wt 294 lb (133.4 kg)   LMP 07/02/2021   SpO2 97%   BMI 47.45 kg/m   General: 50 year old female in no acute distress. Head: Normocephalic and atraumatic. Eyes:  Sclerae non-icteric, conjunctive pink. Ears: Normal auditory acuity. Mouth: Dentition intact. No ulcers or lesions.  Neck: Supple, no lymphadenopathy or thyromegaly.  Lungs: Clear bilaterally to auscultation without wheezes, crackles or rhonchi. Heart: Regular rate and rhythm. No murmur, rub or gallop appreciated.  Abdomen: Soft, nontender, non distended. No masses. No hepatosplenomegaly. Normoactive bowel sounds  x 4 quadrants.  Rectal: Deferred.  Musculoskeletal: Symmetrical with no gross deformities. Skin: Warm and dry. No rash or lesions on visible extremities. Extremities: No edema. Neurological: Alert oriented x 4, no focal deficits.  Psychological:  Alert and cooperative. Normal mood and affect.  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 Scheduled for a a laparoscopic hysterectomy with salpingectomy on 07/20/2021.  3) History of post partum cardiomyopathy. LV EF 55- 65%. No CP or SOB. -Patient was reminded to schedule an annual follow-up with her cardiologist Duane Carrollwood due 08/2021       CC:  McLean-Scocuzza, Olivia Mackie *

## 2021-07-11 NOTE — Patient Instructions (Signed)
If you are age 48 or younger, your body mass index should be between 19-25. Your Body mass index is 47.45 kg/m. If this is out of the aformentioned range listed, please consider follow up with your Primary Care Provider.   The [facility] providers would like to encourage you to use [facility] to communicate with providers for non-urgent requests or questions.  Due to long hold times on the telephone, sending your provider a message by [facility] may be faster and more efficient way to get a response. Please allow 48 business hours for a response.  Please remember that this is for non-urgent requests/questions.  We will need to schedule your colonoscopy for late December or January once you have fully recovered from the hysterectomy. Currently Dr. Havery Moros does not have any hospital spots we can use. As it gets closer to December we will be contacting you and setting you up for the colonoscopy.  Once you are scheduled you will need to hold your iron supplement for 1 week prior to the colonoscopy prep date then restart after the procedure.  It was great seeing you today! Thank you for entrusting me with your care and choosing [facility].  Noralyn Pick, CRNP

## 2021-07-11 NOTE — Progress Notes (Signed)
Agree with assessment and plan as outlined.  

## 2021-07-17 ENCOUNTER — Encounter
Admission: RE | Admit: 2021-07-17 | Discharge: 2021-07-17 | Disposition: A | Discharge status: Home / Self Care | Payer: BC Managed Care – PPO | Source: Ambulatory Visit | Attending: Obstetrics and Gynecology | Admitting: Obstetrics and Gynecology

## 2021-07-17 ENCOUNTER — Other Ambulatory Visit: Payer: Self-pay

## 2021-07-17 DIAGNOSIS — I509 Heart failure, unspecified: Secondary | ICD-10-CM | POA: Diagnosis not present

## 2021-07-17 DIAGNOSIS — Z01812 Encounter for preprocedural laboratory examination: Secondary | ICD-10-CM | POA: Insufficient documentation

## 2021-07-17 DIAGNOSIS — Z79899 Other long term (current) drug therapy: Secondary | ICD-10-CM | POA: Diagnosis not present

## 2021-07-17 DIAGNOSIS — N920 Excessive and frequent menstruation with regular cycle: Secondary | ICD-10-CM | POA: Diagnosis present

## 2021-07-17 DIAGNOSIS — Z7982 Long term (current) use of aspirin: Secondary | ICD-10-CM | POA: Diagnosis not present

## 2021-07-17 DIAGNOSIS — Z793 Long term (current) use of hormonal contraceptives: Secondary | ICD-10-CM | POA: Diagnosis not present

## 2021-07-17 DIAGNOSIS — Z975 Presence of (intrauterine) contraceptive device: Secondary | ICD-10-CM | POA: Diagnosis not present

## 2021-07-17 DIAGNOSIS — D259 Leiomyoma of uterus, unspecified: Secondary | ICD-10-CM | POA: Diagnosis not present

## 2021-07-17 DIAGNOSIS — Z791 Long term (current) use of non-steroidal anti-inflammatories (NSAID): Secondary | ICD-10-CM | POA: Diagnosis not present

## 2021-07-17 DIAGNOSIS — Z880 Allergy status to penicillin: Secondary | ICD-10-CM | POA: Diagnosis not present

## 2021-07-17 DIAGNOSIS — Z6841 Body Mass Index (BMI) 40.0 and over, adult: Secondary | ICD-10-CM | POA: Diagnosis not present

## 2021-07-17 DIAGNOSIS — E669 Obesity, unspecified: Secondary | ICD-10-CM | POA: Diagnosis not present

## 2021-07-17 DIAGNOSIS — I110 Hypertensive heart disease with heart failure: Secondary | ICD-10-CM | POA: Diagnosis not present

## 2021-07-17 DIAGNOSIS — Z7951 Long term (current) use of inhaled steroids: Secondary | ICD-10-CM | POA: Diagnosis not present

## 2021-07-17 LAB — CBC
HCT: 36.6 % (ref 36.0–46.0)
Hemoglobin: 12.2 g/dL (ref 12.0–15.0)
MCH: 31.4 pg (ref 26.0–34.0)
MCHC: 33.3 g/dL (ref 30.0–36.0)
MCV: 94.3 fL (ref 80.0–100.0)
Platelets: 261 10*3/uL (ref 150–400)
RBC: 3.88 MIL/uL (ref 3.87–5.11)
RDW: 14.6 % (ref 11.5–15.5)
WBC: 5.6 10*3/uL (ref 4.0–10.5)
nRBC: 0 % (ref 0.0–0.2)

## 2021-07-17 LAB — TYPE AND SCREEN
ABO/RH(D): AB POS
Antibody Screen: NEGATIVE

## 2021-07-17 LAB — BASIC METABOLIC PANEL
Anion gap: 7 (ref 5–15)
BUN: 10 mg/dL (ref 6–20)
CO2: 24 mmol/L (ref 22–32)
Calcium: 9.1 mg/dL (ref 8.9–10.3)
Chloride: 105 mmol/L (ref 98–111)
Creatinine, Ser: 0.75 mg/dL (ref 0.44–1.00)
GFR, Estimated: >60 mL/min (ref >60)
Glucose, Bld: 105 mg/dL — ABNORMAL HIGH (ref 70–99)
Potassium: 3.5 mmol/L (ref 3.5–5.1)
Sodium: 136 mmol/L (ref 135–145)

## 2021-07-19 NOTE — Anesthesia Preprocedure Evaluation (Addendum)
Anesthesia Evaluation  Patient identified by MRN, date of birth, ID band Patient awake    Reviewed: Allergy & Precautions, NPO status , Patient's Chart, lab work & pertinent test results  History of Anesthesia Complications Negative for: history of anesthetic complications  Airway Mallampati: III  TM Distance: >3 FB Neck ROM: Full    Dental  (+) Missing,    Pulmonary asthma (exercise induced) , neg sleep apnea, neg COPD, Not current smoker, former smoker,    Pulmonary exam normal        Cardiovascular Exercise Tolerance: Good hypertension, Pt. on medications +CHF (pregnancy induced cardiomyopathy)  (-) Past MI (-) dysrhythmias (-) Valvular Problems/Murmurs Rhythm:Regular Rate:Normal  History of prolonged Q-T interval on ECG CHF NYHA class I (no symptoms from ordinary activities)    Neuro/Psych neg Seizures    GI/Hepatic Neg liver ROS, neg GERD  ,  Endo/Other  neg diabetesMorbid obesityPrediabetes  Renal/GU negative Renal ROS   abnormal uterine bleeding, fibroids    Musculoskeletal   Abdominal (+) + obese,   Peds  Hematology   Anesthesia Other Findings   Reproductive/Obstetrics                            Anesthesia Physical  Anesthesia Plan  ASA: 3  Anesthesia Plan: General   Post-op Pain Management:    Induction: Intravenous  PONV Risk Score and Plan: 3 and Ondansetron, Dexamethasone, TIVA, Midazolam and Scopolamine patch - Pre-op  Airway Management Planned: Oral ETT  Additional Equipment: None  Intra-op Plan:   Post-operative Plan: Extubation in OR  Informed Consent: I have reviewed the patients History and Physical, chart, labs and discussed the procedure including the risks, benefits and alternatives for the proposed anesthesia with the patient or authorized representative who has indicated his/her understanding and acceptance.     Dental advisory given  Plan  Discussed with: CRNA and Anesthesiologist  Anesthesia Plan Comments:        Anesthesia Quick Evaluation

## 2021-07-20 ENCOUNTER — Ambulatory Visit: Payer: BC Managed Care – PPO | Admitting: Anesthesiology

## 2021-07-20 ENCOUNTER — Ambulatory Visit
Admission: RE | Admit: 2021-07-20 | Discharge: 2021-07-20 | Disposition: A | Discharge status: Home / Self Care | Payer: BC Managed Care – PPO | Attending: Obstetrics and Gynecology | Admitting: Obstetrics and Gynecology

## 2021-07-20 ENCOUNTER — Encounter: Payer: Self-pay | Admitting: Obstetrics and Gynecology

## 2021-07-20 ENCOUNTER — Other Ambulatory Visit: Payer: Self-pay

## 2021-07-20 ENCOUNTER — Encounter
Admission: RE | Disposition: A | Discharge status: Home / Self Care | Payer: Self-pay | Source: Home / Self Care | Attending: Obstetrics and Gynecology

## 2021-07-20 DIAGNOSIS — Z880 Allergy status to penicillin: Secondary | ICD-10-CM | POA: Insufficient documentation

## 2021-07-20 DIAGNOSIS — N920 Excessive and frequent menstruation with regular cycle: Secondary | ICD-10-CM | POA: Insufficient documentation

## 2021-07-20 DIAGNOSIS — I110 Hypertensive heart disease with heart failure: Secondary | ICD-10-CM | POA: Insufficient documentation

## 2021-07-20 DIAGNOSIS — Z7951 Long term (current) use of inhaled steroids: Secondary | ICD-10-CM | POA: Insufficient documentation

## 2021-07-20 DIAGNOSIS — D259 Leiomyoma of uterus, unspecified: Secondary | ICD-10-CM | POA: Insufficient documentation

## 2021-07-20 DIAGNOSIS — Z975 Presence of (intrauterine) contraceptive device: Secondary | ICD-10-CM | POA: Insufficient documentation

## 2021-07-20 DIAGNOSIS — I509 Heart failure, unspecified: Secondary | ICD-10-CM | POA: Insufficient documentation

## 2021-07-20 DIAGNOSIS — E669 Obesity, unspecified: Secondary | ICD-10-CM | POA: Insufficient documentation

## 2021-07-20 DIAGNOSIS — Z6841 Body Mass Index (BMI) 40.0 and over, adult: Secondary | ICD-10-CM | POA: Insufficient documentation

## 2021-07-20 DIAGNOSIS — Z791 Long term (current) use of non-steroidal anti-inflammatories (NSAID): Secondary | ICD-10-CM | POA: Insufficient documentation

## 2021-07-20 DIAGNOSIS — Z7982 Long term (current) use of aspirin: Secondary | ICD-10-CM | POA: Insufficient documentation

## 2021-07-20 DIAGNOSIS — Z793 Long term (current) use of hormonal contraceptives: Secondary | ICD-10-CM | POA: Insufficient documentation

## 2021-07-20 DIAGNOSIS — Z79899 Other long term (current) drug therapy: Secondary | ICD-10-CM | POA: Insufficient documentation

## 2021-07-20 HISTORY — PX: TOTAL LAPAROSCOPIC HYSTERECTOMY WITH SALPINGECTOMY: SHX6742

## 2021-07-20 HISTORY — PX: LYSIS OF ADHESION: SHX5961

## 2021-07-20 HISTORY — PX: CYSTOSCOPY: SHX5120

## 2021-07-20 LAB — POCT PREGNANCY, URINE: Preg Test, Ur: NEGATIVE

## 2021-07-20 SURGERY — HYSTERECTOMY, TOTAL, LAPAROSCOPIC, WITH SALPINGECTOMY
Anesthesia: General

## 2021-07-20 MED ORDER — EPHEDRINE SULFATE 50 MG/ML IJ SOLN
INTRAMUSCULAR | Status: DC | PRN
  Administered 2021-07-20: 10 mg via INTRAVENOUS

## 2021-07-20 MED ORDER — PROMETHAZINE HCL 25 MG/ML IJ SOLN: 6.2500 mg | INTRAMUSCULAR | Status: DC | PRN

## 2021-07-20 MED ORDER — FENTANYL CITRATE (PF) 100 MCG/2ML IJ SOLN
25.0000 ug | INTRAMUSCULAR | Status: DC | PRN
  Administered 2021-07-20: 50 ug via INTRAVENOUS
  Administered 2021-07-20: 25 ug via INTRAVENOUS

## 2021-07-20 MED ORDER — ONDANSETRON HCL 4 MG/2ML IJ SOLN
INTRAMUSCULAR | Status: DC | PRN
  Administered 2021-07-20: 4 mg via INTRAVENOUS

## 2021-07-20 MED ORDER — FLUORESCEIN SODIUM 10 % IV SOLN
INTRAVENOUS | Status: AC
  Filled 2021-07-20: qty 5

## 2021-07-20 MED ORDER — OXYCODONE HCL 5 MG PO TABS
ORAL_TABLET | ORAL | Status: AC
  Filled 2021-07-20: qty 1

## 2021-07-20 MED ORDER — PROPOFOL 500 MG/50ML IV EMUL
INTRAVENOUS | Status: AC
  Filled 2021-07-20: qty 50

## 2021-07-20 MED ORDER — MIDAZOLAM HCL 2 MG/2ML IJ SOLN
INTRAMUSCULAR | Status: AC
  Filled 2021-07-20: qty 2

## 2021-07-20 MED ORDER — PROPOFOL 10 MG/ML IV BOLUS
INTRAVENOUS | Status: DC | PRN
  Administered 2021-07-20: 30 ug/kg/min via INTRAVENOUS
  Administered 2021-07-20: 150 mg via INTRAVENOUS

## 2021-07-20 MED ORDER — FAMOTIDINE 20 MG PO TABS: 20.0000 mg | ORAL_TABLET | Freq: Once | ORAL | Status: AC

## 2021-07-20 MED ORDER — OXYCODONE HCL 5 MG PO TABS
5.0000 mg | ORAL_TABLET | Freq: Once | ORAL | Status: AC | PRN
  Administered 2021-07-20: 5 mg via ORAL

## 2021-07-20 MED ORDER — POVIDONE-IODINE 10 % EX SWAB
2.0000 "application " | Freq: Once | CUTANEOUS | Status: AC
  Administered 2021-07-20: 2 via TOPICAL

## 2021-07-20 MED ORDER — ACETAMINOPHEN 500 MG PO TABS
ORAL_TABLET | ORAL | Status: AC
  Administered 2021-07-20: 1000 mg via ORAL
  Filled 2021-07-20: qty 2

## 2021-07-20 MED ORDER — ACETAMINOPHEN 500 MG PO TABS: 1000.0000 mg | ORAL_TABLET | Freq: Four times a day (QID) | ORAL | 0 refills | Status: AC

## 2021-07-20 MED ORDER — IBUPROFEN 800 MG PO TABS: 800.0000 mg | ORAL_TABLET | Freq: Three times a day (TID) | ORAL | 1 refills | Status: AC

## 2021-07-20 MED ORDER — DEXAMETHASONE SODIUM PHOSPHATE 10 MG/ML IJ SOLN
INTRAMUSCULAR | Status: DC | PRN
  Administered 2021-07-20: 10 mg via INTRAVENOUS

## 2021-07-20 MED ORDER — PHENYLEPHRINE HCL (PRESSORS) 10 MG/ML IV SOLN
INTRAVENOUS | Status: AC
  Filled 2021-07-20: qty 1

## 2021-07-20 MED ORDER — GABAPENTIN 800 MG PO TABS: 800.0000 mg | ORAL_TABLET | Freq: Every day | ORAL | 0 refills | Status: DC

## 2021-07-20 MED ORDER — MIDAZOLAM HCL 2 MG/2ML IJ SOLN
INTRAMUSCULAR | Status: DC | PRN
  Administered 2021-07-20: 2 mg via INTRAVENOUS

## 2021-07-20 MED ORDER — SUGAMMADEX SODIUM 200 MG/2ML IV SOLN
INTRAVENOUS | Status: DC | PRN
  Administered 2021-07-20: 75 mg via INTRAVENOUS
  Administered 2021-07-20: 200 mg via INTRAVENOUS

## 2021-07-20 MED ORDER — FENTANYL CITRATE (PF) 100 MCG/2ML IJ SOLN
INTRAMUSCULAR | Status: AC
  Administered 2021-07-20: 25 ug via INTRAVENOUS
  Filled 2021-07-20: qty 2

## 2021-07-20 MED ORDER — SCOPOLAMINE 1 MG/3DAYS TD PT72
MEDICATED_PATCH | TRANSDERMAL | Status: AC
  Administered 2021-07-20: 1.5 mg via TRANSDERMAL
  Filled 2021-07-20: qty 1

## 2021-07-20 MED ORDER — FAMOTIDINE 20 MG PO TABS
ORAL_TABLET | ORAL | Status: AC
  Administered 2021-07-20: 20 mg via ORAL
  Filled 2021-07-20: qty 1

## 2021-07-20 MED ORDER — 0.9 % SODIUM CHLORIDE (POUR BTL) OPTIME
TOPICAL | Status: DC | PRN
  Administered 2021-07-20: 150 mL

## 2021-07-20 MED ORDER — OXYCODONE HCL 5 MG PO TABS: 5.0000 mg | ORAL_TABLET | ORAL | 0 refills | Status: DC | PRN

## 2021-07-20 MED ORDER — PHENYLEPHRINE HCL (PRESSORS) 10 MG/ML IV SOLN
INTRAVENOUS | Status: DC | PRN
  Administered 2021-07-20 (×7): 200 ug via INTRAVENOUS

## 2021-07-20 MED ORDER — LACTATED RINGERS IV SOLN: INTRAVENOUS | Status: DC

## 2021-07-20 MED ORDER — CHLORHEXIDINE GLUCONATE 0.12 % MT SOLN
OROMUCOSAL | Status: AC
  Administered 2021-07-20: 15 mL via OROMUCOSAL
  Filled 2021-07-20: qty 15

## 2021-07-20 MED ORDER — ACETAMINOPHEN 500 MG PO TABS: 1000.0000 mg | ORAL_TABLET | ORAL | Status: AC

## 2021-07-20 MED ORDER — KETOROLAC TROMETHAMINE 30 MG/ML IJ SOLN
INTRAMUSCULAR | Status: DC | PRN
  Administered 2021-07-20: 30 mg via INTRAVENOUS

## 2021-07-20 MED ORDER — SUCCINYLCHOLINE CHLORIDE 200 MG/10ML IV SOSY
PREFILLED_SYRINGE | INTRAVENOUS | Status: DC | PRN
  Administered 2021-07-20: 140 mg via INTRAVENOUS

## 2021-07-20 MED ORDER — FENTANYL CITRATE (PF) 100 MCG/2ML IJ SOLN
INTRAMUSCULAR | Status: AC
  Filled 2021-07-20: qty 2

## 2021-07-20 MED ORDER — PROPOFOL 10 MG/ML IV BOLUS
INTRAVENOUS | Status: AC
  Filled 2021-07-20: qty 20

## 2021-07-20 MED ORDER — GABAPENTIN 300 MG PO CAPS
ORAL_CAPSULE | ORAL | Status: AC
  Administered 2021-07-20: 300 mg via ORAL
  Filled 2021-07-20: qty 1

## 2021-07-20 MED ORDER — FENTANYL CITRATE (PF) 100 MCG/2ML IJ SOLN
INTRAMUSCULAR | Status: DC | PRN
  Administered 2021-07-20: 50 ug via INTRAVENOUS

## 2021-07-20 MED ORDER — SODIUM CHLORIDE 0.9 % IR SOLN
Status: DC | PRN
  Administered 2021-07-20 (×2): 150 mL

## 2021-07-20 MED ORDER — LIDOCAINE HCL (CARDIAC) PF 100 MG/5ML IV SOSY
PREFILLED_SYRINGE | INTRAVENOUS | Status: DC | PRN
  Administered 2021-07-20: 100 mg via INTRAVENOUS

## 2021-07-20 MED ORDER — DROPERIDOL 2.5 MG/ML IJ SOLN
0.6250 mg | Freq: Once | INTRAMUSCULAR | Status: DC | PRN
  Filled 2021-07-20: qty 2

## 2021-07-20 MED ORDER — FENTANYL CITRATE (PF) 100 MCG/2ML IJ SOLN
INTRAMUSCULAR | Status: AC
  Administered 2021-07-20: 50 ug via INTRAVENOUS
  Filled 2021-07-20: qty 2

## 2021-07-20 MED ORDER — BUPIVACAINE HCL 0.5 % IJ SOLN
INTRAMUSCULAR | Status: DC | PRN
  Administered 2021-07-20: 12 mL

## 2021-07-20 MED ORDER — ACETAMINOPHEN 10 MG/ML IV SOLN
INTRAVENOUS | Status: AC
  Filled 2021-07-20: qty 100

## 2021-07-20 MED ORDER — HEMOSTATIC AGENTS (NO CHARGE) OPTIME
TOPICAL | Status: DC | PRN
  Administered 2021-07-20: 1 via TOPICAL

## 2021-07-20 MED ORDER — ROCURONIUM BROMIDE 100 MG/10ML IV SOLN
INTRAVENOUS | Status: DC | PRN
  Administered 2021-07-20: 40 mg via INTRAVENOUS
  Administered 2021-07-20 (×2): 10 mg via INTRAVENOUS
  Administered 2021-07-20: 20 mg via INTRAVENOUS

## 2021-07-20 MED ORDER — DEXMEDETOMIDINE (PRECEDEX) IN NS 20 MCG/5ML (4 MCG/ML) IV SYRINGE
PREFILLED_SYRINGE | INTRAVENOUS | Status: DC | PRN
  Administered 2021-07-20 (×2): 8 ug via INTRAVENOUS
  Administered 2021-07-20: 4 ug via INTRAVENOUS
  Administered 2021-07-20: 20 ug via INTRAVENOUS

## 2021-07-20 MED ORDER — GABAPENTIN 300 MG PO CAPS: 300.0000 mg | ORAL_CAPSULE | ORAL | Status: AC

## 2021-07-20 MED ORDER — SCOPOLAMINE 1 MG/3DAYS TD PT72: 1.0000 | MEDICATED_PATCH | Freq: Once | TRANSDERMAL | Status: DC

## 2021-07-20 MED ORDER — ORAL CARE MOUTH RINSE: 15.0000 mL | Freq: Once | OROMUCOSAL | Status: AC

## 2021-07-20 MED ORDER — KETAMINE HCL 50 MG/5ML IJ SOSY
PREFILLED_SYRINGE | INTRAMUSCULAR | Status: AC
  Filled 2021-07-20: qty 5

## 2021-07-20 MED ORDER — CEFAZOLIN SODIUM-DEXTROSE 2-4 GM/100ML-% IV SOLN
2.0000 g | INTRAVENOUS | Status: AC
  Administered 2021-07-20: 1 g via INTRAVENOUS
  Administered 2021-07-20: 2 g via INTRAVENOUS

## 2021-07-20 MED ORDER — DOCUSATE SODIUM 100 MG PO CAPS
100.0000 mg | ORAL_CAPSULE | Freq: Two times a day (BID) | ORAL | 0 refills | Status: DC
Start: 2021-07-20 — End: 2022-05-07

## 2021-07-20 MED ORDER — OXYCODONE HCL 5 MG PO TABS
5.0000 mg | ORAL_TABLET | Freq: Once | ORAL | Status: DC | PRN
Start: 2021-07-20 — End: 2021-07-20

## 2021-07-20 MED ORDER — ACETAMINOPHEN 10 MG/ML IV SOLN
1000.0000 mg | Freq: Once | INTRAVENOUS | Status: DC | PRN
  Administered 2021-07-20: 1000 mg via INTRAVENOUS

## 2021-07-20 MED ORDER — EPHEDRINE 5 MG/ML INJ
INTRAVENOUS | Status: AC
  Filled 2021-07-20: qty 5

## 2021-07-20 MED ORDER — CHLORHEXIDINE GLUCONATE 0.12 % MT SOLN: 15.0000 mL | Freq: Once | OROMUCOSAL | Status: AC

## 2021-07-20 MED ORDER — CEFAZOLIN SODIUM-DEXTROSE 2-4 GM/100ML-% IV SOLN
INTRAVENOUS | Status: AC
  Filled 2021-07-20: qty 100

## 2021-07-20 MED ORDER — OXYCODONE HCL 5 MG/5ML PO SOLN
5.0000 mg | Freq: Once | ORAL | Status: AC | PRN
Start: 2021-07-20 — End: 2021-07-20

## 2021-07-20 MED ORDER — BUPIVACAINE HCL (PF) 0.5 % IJ SOLN
INTRAMUSCULAR | Status: AC
  Filled 2021-07-20: qty 30

## 2021-07-20 MED ORDER — KETAMINE HCL 10 MG/ML IJ SOLN
INTRAMUSCULAR | Status: DC | PRN
  Administered 2021-07-20: 30 mg via INTRAVENOUS
  Administered 2021-07-20: 20 mg via INTRAVENOUS

## 2021-07-20 SURGICAL SUPPLY — 66 items
ADH SKN CLS APL DERMABOND .7 (GAUZE/BANDAGES/DRESSINGS) ×3
APL PRP STRL LF DISP 70% ISPRP (MISCELLANEOUS) ×3
APL SRG 38 LTWT LNG FL B (MISCELLANEOUS) ×3
APPLICATOR ARISTA FLEXITIP XL (MISCELLANEOUS) ×4 IMPLANT
BAG DRN RND TRDRP ANRFLXCHMBR (UROLOGICAL SUPPLIES) ×3
BAG URINE DRAIN 2000ML AR STRL (UROLOGICAL SUPPLIES) ×4 IMPLANT
BLADE SURG SZ11 CARB STEEL (BLADE) ×4 IMPLANT
CATH FOLEY 2WAY  5CC 16FR (CATHETERS) ×4
CATH FOLEY 2WAY 5CC 16FR (CATHETERS) ×3
CATH URTH 16FR FL 2W BLN LF (CATHETERS) ×3 IMPLANT
CHLORAPREP W/TINT 26 (MISCELLANEOUS) ×4 IMPLANT
CORD MONOPOLAR M/FML 12FT (MISCELLANEOUS) ×4 IMPLANT
COVER LIGHT HANDLE STERIS (MISCELLANEOUS) ×8 IMPLANT
DERMABOND ADVANCED (GAUZE/BANDAGES/DRESSINGS) ×1
DERMABOND ADVANCED .7 DNX12 (GAUZE/BANDAGES/DRESSINGS) ×3 IMPLANT
DRAPE GENERAL ENDO 106X123.5 (DRAPES) ×4 IMPLANT
DRAPE STERI POUCH LG 24X46 STR (DRAPES) ×4 IMPLANT
GAUZE 4X4 16PLY ~~LOC~~+RFID DBL (SPONGE) ×8 IMPLANT
GLOVE SURG ENC MOIS LTX SZ7 (GLOVE) ×16 IMPLANT
GLOVE SURG SYN 8.0 (GLOVE) ×4 IMPLANT
GLOVE SURG UNDER LTX SZ7.5 (GLOVE) ×12 IMPLANT
GOWN STRL REUS W/ TWL LRG LVL3 (GOWN DISPOSABLE) ×18 IMPLANT
GOWN STRL REUS W/ TWL XL LVL3 (GOWN DISPOSABLE) ×3 IMPLANT
GOWN STRL REUS W/TWL LRG LVL3 (GOWN DISPOSABLE) ×24
GOWN STRL REUS W/TWL XL LVL3 (GOWN DISPOSABLE) ×4
HANDLE YANKAUER SUCT BULB TIP (MISCELLANEOUS) ×4 IMPLANT
HEMOSTAT ARISTA ABSORB 3G PWDR (HEMOSTASIS) ×4 IMPLANT
IRRIGATION STRYKERFLOW (MISCELLANEOUS) ×3 IMPLANT
IRRIGATOR STRYKERFLOW (MISCELLANEOUS) ×4
IV NS 1000ML (IV SOLUTION) ×4
IV NS 1000ML BAXH (IV SOLUTION) ×3 IMPLANT
KIT PINK PAD W/HEAD ARE REST (MISCELLANEOUS) ×4
KIT PINK PAD W/HEAD ARM REST (MISCELLANEOUS) ×3 IMPLANT
KIT TURNOVER CYSTO (KITS) ×4 IMPLANT
L-HOOK LAP DISP 36CM (ELECTROSURGICAL) ×4
LHOOK LAP DISP 36CM (ELECTROSURGICAL) ×3 IMPLANT
LIGASURE VESSEL 5MM BLUNT TIP (ELECTROSURGICAL) ×4 IMPLANT
MANIFOLD NEPTUNE II (INSTRUMENTS) ×4 IMPLANT
MANIPULATOR VCARE SML CRV RETR (MISCELLANEOUS) ×4 IMPLANT
NS IRRIG 500ML POUR BTL (IV SOLUTION) ×4 IMPLANT
OCCLUDER COLPOPNEUMO (BALLOONS) ×4 IMPLANT
PACK GYN LAPAROSCOPIC (MISCELLANEOUS) ×4 IMPLANT
PAD PREP 24X41 OB/GYN DISP (PERSONAL CARE ITEMS) ×4 IMPLANT
PENCIL ELECTRO HAND CTR (MISCELLANEOUS) ×4 IMPLANT
PORT ACCESS TROCAR AIRSEAL 5 (TROCAR) ×4 IMPLANT
SCRUB EXIDINE 4% CHG 4OZ (MISCELLANEOUS) ×4 IMPLANT
SET CYSTO W/LG BORE CLAMP LF (SET/KITS/TRAYS/PACK) ×4 IMPLANT
SET TRI-LUMEN FLTR TB AIRSEAL (TUBING) ×4 IMPLANT
SLEEVE ENDOPATH XCEL 5M (ENDOMECHANICALS) ×4 IMPLANT
SOL PREP PROV IODINE SCRUB 4OZ (MISCELLANEOUS) ×4 IMPLANT
SUT MNCRL 4-0 (SUTURE) ×4
SUT MNCRL 4-0 27XMFL (SUTURE) ×3
SUT MNCRL AB 4-0 PS2 18 (SUTURE) ×4 IMPLANT
SUT VIC AB 0 CT1 36 (SUTURE) ×12 IMPLANT
SUT VIC AB 2-0 UR6 27 (SUTURE) ×4 IMPLANT
SUT VIC AB 4-0 SH 27 (SUTURE) ×4
SUT VIC AB 4-0 SH 27XANBCTRL (SUTURE) ×3 IMPLANT
SUTURE MNCRL 4-0 27XMF (SUTURE) ×3 IMPLANT
SYR 10ML LL (SYRINGE) ×4 IMPLANT
SYR 50ML LL SCALE MARK (SYRINGE) ×4 IMPLANT
SYR TOOMEY IRRIG 70ML (MISCELLANEOUS) ×4
SYRINGE TOOMEY IRRIG 70ML (MISCELLANEOUS) ×3 IMPLANT
TROCAR ENDO BLADELESS 11MM (ENDOMECHANICALS) ×4 IMPLANT
TROCAR PORT AIRSEAL 8X100 (TROCAR) IMPLANT
TROCAR XCEL NON-BLD 5MMX100MML (ENDOMECHANICALS) ×4 IMPLANT
WATER STERILE IRR 500ML POUR (IV SOLUTION) ×4 IMPLANT

## 2021-07-20 NOTE — Op Note (Addendum)
Heather Case PROCEDURE DATE: 07/20/2021  PREOPERATIVE DIAGNOSIS: Menorrhagia, fibroids POSTOPERATIVE DIAGNOSIS: The same + bladder adhesions PROCEDURE:  TOTAL LAPAROSCOPIC HYSTERECTOMY WITH SALPINGECTOMY:  LYSIS OF ADHESION: [id number] CYSTOSCOPY: 52000 (CPT)  SURGEON:  Dr. Benjaman Kindler, MD ASSISTANT: Dr. Laverta Baltimore, MD Anesthesiologist:  Anesthesiologist: Iran Ouch, MD CRNA: Doreen Salvage, CRNA  INDICATIONS: 50 y.o. [phone]  here for definitive surgical management of her fibroids and heavy bleeding, secondary to the indications listed under preoperative diagnoses.Please see preoperative note for further details.  Risks of surgery were discussed with the patient including but not limited to: bleeding which may require transfusion or reoperation; infection which may require antibiotics; injury to bowel, bladder, ureters or other surrounding organs; need for additional procedures; thromboembolic phenomenon, incisional problems and other postoperative/anesthesia complications. Written informed consent was obtained.    FINDINGS:  Enlarged uterus, thickened bladder adhesions to the uterus, normal upper abdomen except for an adhesion on the left liver lobe to the anterior abdominal wall. No omental adhesions noted.  ANESTHESIA:    General INTRAVENOUS FLUIDS:1500  ml ESTIMATED BLOOD LOSS:100 ml URINE OUTPUT: 800 ml   SPECIMENS: Uterus, cervix, bilateral fallopian tubes COMPLICATIONS: None immediate  PROCEDURE IN DETAIL:  The patient received prophalactic intravenous antibiotics and had sequential compression devices applied to her lower extremities while in the preoperative area.  She was then taken to the operating room where general anesthesia was administered and was found to be adequate.  She was placed in the dorsal lithotomy position, and was prepped and draped in a sterile manner.  A formal time out was performed with all team members present and in  agreement.  A V-care uterine manipulator was placed at this time.  A Foley catheter was inserted into her bladder and attached to constant drainage. Attention was turned to the abdomen and 0.5% Marcaine infused subq. A 7mm umbilical incision was made with the scalpel.  The Optiview 5-mm trocar and sleeve were then advanced without difficulty with the laparoscope under direct visualization into the abdomen.  The abdomen was then insufflated with carbon dioxide gas and adequate pneumoperitoneum was obtained.  A survey of the patient's pelvis and abdomen revealed the findings above.  Bilateral lower quadrant ports (5 mm on the right and 8 mm AirSeal on the left) were then placed under direct visualization.  The pelvis was then carefully examined.  Attention was turned to the fallopian tubes; these were freed from the underlying mesosalpinx and the uterine attachments using the Ligasure device.  The bilateral round and broad ligaments were then clamped and transected with the Ligasure device.  The uterine artery was then skeletonized and a bladder flap was created.  The ureters were noted to be safely away from the area of dissection.  The bladder was then sharply dissected off the lower uterine segment. Bleeding from these scars caused most of the blood loss of the surgery. No omental adhesions noted.  At this point, attention was turned to the uterine vessels, which were clamped and cauterized using the Ligasure on the left, and then the right. After the uterine blood flow at the level of the internal os was controlled, both arteries were cut with the Ligasure.  Good hemostasis was noted overall.  The uterosacral and cardinal ligaments were clamped, cut and ligated bilaterally .  Attention was then turned to the cervicovaginal junction, and monopolar scissors were used to transect the cervix from the surrounding vagina using the ring of the V-care as a guide. This was done  circumferentially allowing total  hysterectomy.  The uterus was then removed from the vagina and the vaginal cuff incision was then closed with running V-loc suture.  Overall excellent hemostasis was noted.    Attention was returned to the abdomen.The ureters were reexamined bilaterally and were pulsating normally. The abdominal pressure was reduced and hemostasis was confirmed. Arista placed.  Cystoscopy showed bilateral ureteral jets. No stitches or damage were visualized in the bladder during cystoscopy.  All trocars were removed under direct visualization, and the abdomen was desufflated.  All skin incisions were closed with 4-0 monocryl subcuticular stitches and Dermabond. The patient tolerated the procedures well.  All instruments, needles, and sponge counts were correct x 2. The patient was taken to the recovery room awake, extubated and in stable condition.

## 2021-07-20 NOTE — Transfer of Care (Signed)
Immediate Anesthesia Transfer of Care Note  Patient: Heather Case  Procedure(s) Performed: TOTAL LAPAROSCOPIC HYSTERECTOMY WITH SALPINGECTOMY (Bilateral) LYSIS OF ADHESION CYSTOSCOPY  Patient Location: PACU  Anesthesia Type:General  Level of Consciousness: awake, patient cooperative and responds to stimulation  Airway & Oxygen Therapy: Patient Spontanous Breathing  Post-op Assessment: Report given to RN, Post -op Vital signs reviewed and stable and Patient moving all extremities X 4  Post vital signs: Reviewed and stable  Last Vitals:  Vitals Value Taken Time  BP 137/71 07/20/21 1045  Temp 36.4 C 07/20/21 1036  Pulse 53 07/20/21 1056  Resp 12 07/20/21 1056  SpO2 100 % 07/20/21 1056  Vitals shown include unvalidated device data.  Last Pain:  Vitals:   07/20/21 1036  TempSrc:   PainSc: 0-No pain         Complications: No notable events documented.

## 2021-07-20 NOTE — Anesthesia Procedure Notes (Signed)
Procedure Name: Intubation Date/Time: 07/20/2021 7:36 AM Performed by: Iran Ouch, MD Pre-anesthesia Checklist: Patient identified, Emergency Drugs available, Suction available and Patient being monitored Patient Re-evaluated:Patient Re-evaluated prior to induction Oxygen Delivery Method: Circle system utilized Preoxygenation: Pre-oxygenation with 100% oxygen Induction Type: IV induction Ventilation: Mask ventilation without difficulty Laryngoscope Size: Mac and 3 Grade View: Grade II Tube type: Oral Tube size: 6.5 mm Number of attempts: 1 Airway Equipment and Method: Stylet Placement Confirmation: ETT inserted through vocal cords under direct vision, positive ETCO2 and breath sounds checked- equal and bilateral Secured at: 21 cm Tube secured with: Tape Dental Injury: Teeth and Oropharynx as per pre-operative assessment

## 2021-07-20 NOTE — Anesthesia Postprocedure Evaluation (Signed)
Anesthesia Post Note  Patient: Heather Case  Procedure(s) Performed: TOTAL LAPAROSCOPIC HYSTERECTOMY WITH SALPINGECTOMY (Bilateral) LYSIS OF ADHESION CYSTOSCOPY  Patient location during evaluation: PACU Anesthesia Type: General Level of consciousness: awake and alert Pain management: pain level controlled Vital Signs Assessment: post-procedure vital signs reviewed and stable Respiratory status: spontaneous breathing, nonlabored ventilation and respiratory function stable Cardiovascular status: blood pressure returned to baseline and stable Postop Assessment: no apparent nausea or vomiting Anesthetic complications: no   No notable events documented.   Last Vitals:  Vitals:   07/20/21 1115 07/20/21 1130  BP: (!) 159/91 (!) 150/96  Pulse: (!) 55 (!) 56  Resp: (!) 9 12  Temp:    SpO2: 100% 97%    Last Pain:  Vitals:   07/20/21 1140  TempSrc:   PainSc: Onyx

## 2021-07-20 NOTE — Discharge Instructions (Addendum)
Discharge instructions after   total laparoscopic hysterectomy   For the next three days, take ibuprofen and acetaminophen on a schedule, every 8 hours. You can take them together or you can intersperse them, and take one every four hours. I also gave you gabapentin for nighttime, to help you sleep and also to control pain. Take gabapentin medicines at night for at least the next 3 nights. You also have a narcotic, oxycodone, to take as needed if the above medicines don't help.  Postop constipation is a major cause of pain. Stay well hydrated, walk as you tolerate, and take over the counter senna as well as stool softeners if you need them.    Signs and Symptoms to Report Call our office at [phone] if you have any of the following.   Fever over 100.4 degrees or higher  Severe stomach pain not relieved with pain medications  Bright red bleeding that's heavier than a period that does not slow with rest  To go the bathroom a lot (frequency), you can't hold your urine (urgency), or it hurts when you empty your bladder (urinate)  Chest pain  Shortness of breath  Pain in the calves of your legs  Severe nausea and vomiting not relieved with anti-nausea medications  Signs of infection around your wounds, such as redness, hot to touch, swelling, green/yellow drainage (like pus), bad smelling discharge  Any concerns  What You Can Expect after Surgery  You may see some pink tinged, bloody fluid and bruising around the wound. This is normal.  You may notice shoulder and neck pain. This is caused by the gas used during surgery to expand your abdomen so your surgeon could get to the uterus easier.  You may have a sore throat because of the tube in your mouth during general anesthesia. This will go away in 2 to 3 days.  You may have some stomach cramps.  You may notice spotting on your panties.  You may have pain around the incision sites.   Activities after Your Discharge Follow these  guidelines to help speed your recovery at home:  Do the coughing and deep breathing as you did in the hospital for 2 weeks. Use the small blue breathing device, called the incentive spirometer for 2 weeks.  Don't drive if you are in pain or taking narcotic pain medicine. You may drive when you can safely slam on the brakes, turn the wheel forcefully, and rotate your torso comfortably. This is typically 1-2 weeks. Practice in a parking lot or side street prior to attempting to drive regularly.   Ask others to help with household chores for 4 weeks.  Do not lift anything heavier that 10 pounds for 4-6 weeks. This includes pets, children, and groceries.  Don't do strenuous activities, exercises, or sports like vacuuming, tennis, squash, etc. until your doctor says it is safe to do so. ---Maintain pelvic rest for 8 weeks. This means nothing in the vagina or rectum at all (no douching, tampons, intercourse) for 8 weeks.   Walk as you feel able. Rest often since it may take two or three weeks for your energy level to return to normal.   You may climb stairs  Avoid constipation:   -Eat fruits, vegetables, and whole grains. Eat small meals as your appetite will take time to return to normal.   -Drink 6 to 8 glasses of water each day unless your doctor has told you to limit your fluids.   -Use a laxative   or stool softener as needed if constipation becomes a problem. You may take Miralax, metamucil, Citrucil, Colace, Senekot, FiberCon, etc. If this does not relieve the constipation, try two tablespoons of Milk Of Magnesia every 8 hours until your bowels move.   You may shower. Gently wash the wounds with a mild soap and water. Pat dry.  Do not get in a hot tub, swimming pool, etc. for 6 weeks.  Do not use lotions, oils, powders on the wounds.  Do not douche, use tampons, or have sex until your doctor says it is okay.  Take your pain medicine when you need it. The medicine may not work as well if the pain is  bad.  Take the medicines you were taking before surgery. Other medications you will need are pain medications and possibly constipation and nausea medications (Zofran).  AMBULATORY SURGERY  DISCHARGE INSTRUCTIONS   The drugs that you were given will stay in your system until tomorrow so for the next 24 hours you should not:  Drive an automobile Make any legal decisions Drink any alcoholic beverage   You may resume regular meals tomorrow.  Today it is better to start with liquids and gradually work up to solid foods.  You may eat anything you prefer, but it is better to start with liquids, then soup and crackers, and gradually work up to solid foods.   Please notify your doctor immediately if you have any unusual bleeding, trouble breathing, redness and pain at the surgery site, drainage, fever, or pain not relieved by medication.    Additional Instructions:        Please contact your physician with any problems or Same Day Surgery at [phone], Monday through Friday 6 am to 4 pm, or [facility] Main number at [phone].  

## 2021-07-20 NOTE — Interval H&P Note (Signed)
History and Physical Interval Note:  07/20/2021 7:31 AM  Heather Case  has presented today for surgery, with the diagnosis of abnormal uterine bleeding, fibroids.  The various methods of treatment have been discussed with the patient and family. After consideration of risks, benefits and other options for treatment, the patient has consented to  Procedure(s): TOTAL LAPAROSCOPIC HYSTERECTOMY WITH SALPINGECTOMY (Bilateral) LYSIS OF ADHESION (N/A) as a surgical intervention.  The patient's history has been reviewed, patient examined, no change in status, stable for surgery.  I have reviewed the patient's chart and labs.  Questions were answered to the patient's satisfaction.     Benjaman Kindler

## 2021-07-21 ENCOUNTER — Encounter: Payer: Self-pay | Admitting: Obstetrics and Gynecology

## 2021-07-24 LAB — SURGICAL PATHOLOGY

## 2021-10-25 ENCOUNTER — Other Ambulatory Visit: Payer: Self-pay | Admitting: Obstetrics and Gynecology

## 2021-10-25 DIAGNOSIS — Z1231 Encounter for screening mammogram for malignant neoplasm of breast: Secondary | ICD-10-CM

## 2021-10-25 IMAGING — MG MM DIGITAL SCREENING BILAT W/ TOMO AND CAD
6 of 10 series · 6 of 30 positions shown · non-contrast
Comparison: Previous exam(s).

CLINICAL DATA: Screening.

EXAM:
DIGITAL SCREENING BILATERAL MAMMOGRAM WITH TOMOSYNTHESIS AND CAD
TECHNIQUE: Bilateral screening digital craniocaudal and mediolateral oblique
mammograms were obtained. Bilateral screening digital breast
tomosynthesis was performed. The images were evaluated with
computer-aided detection.

[R MLO synth-2D]
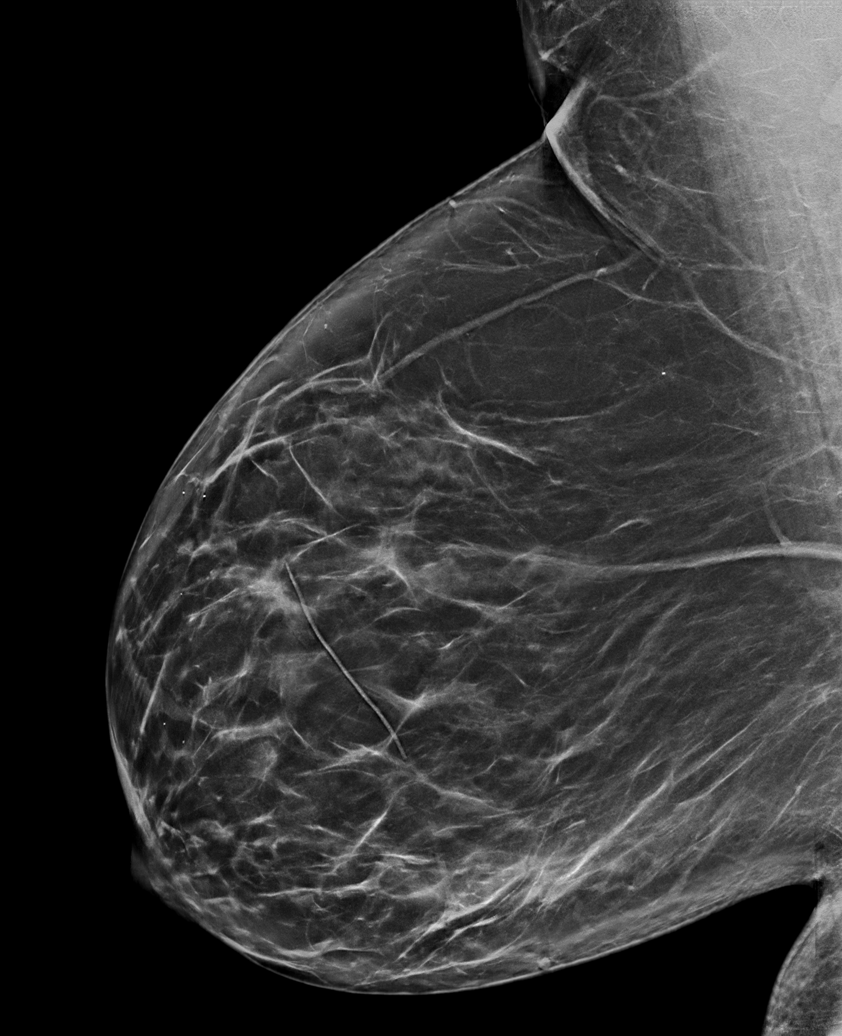

[L MLO synth-2D]
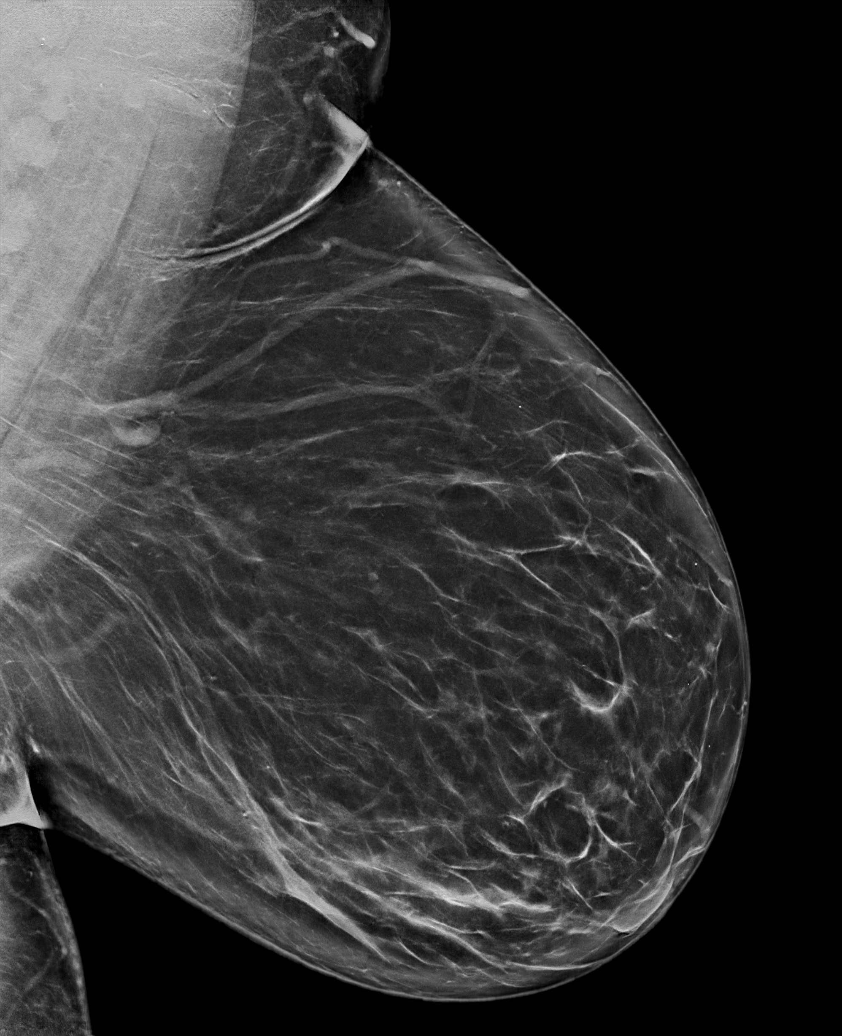

[L CC synth-2D (1 of 2)]
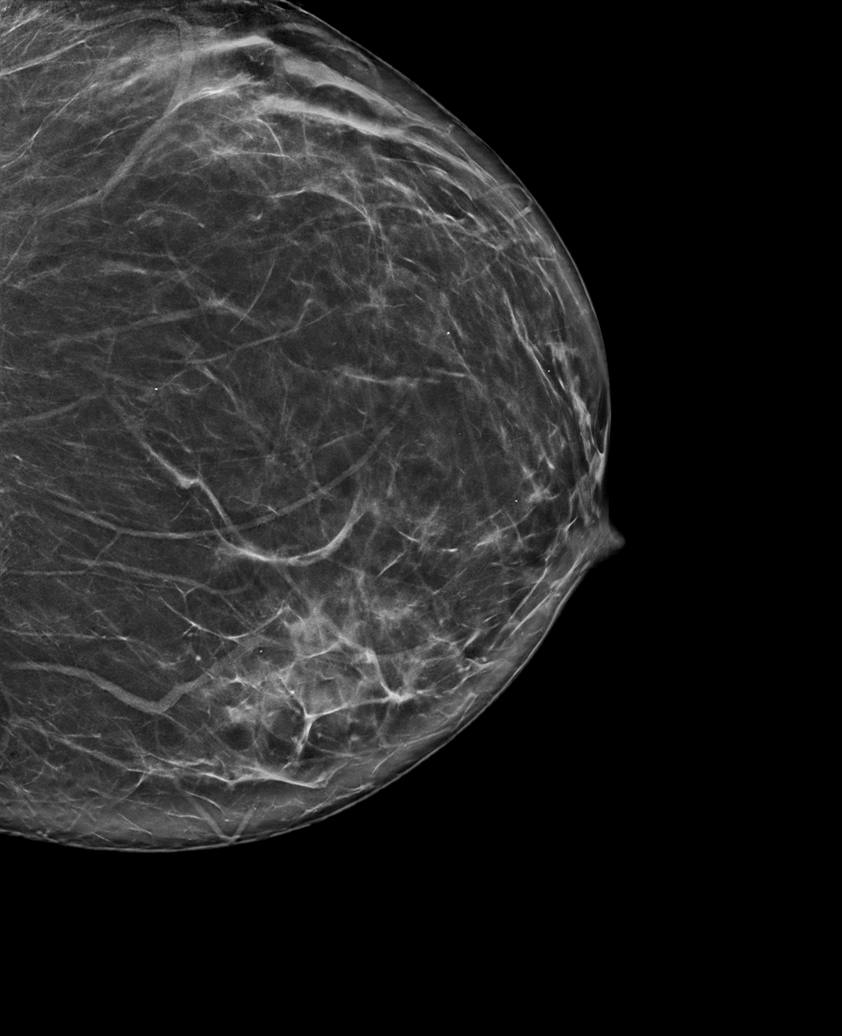

[R CC synth-2D]
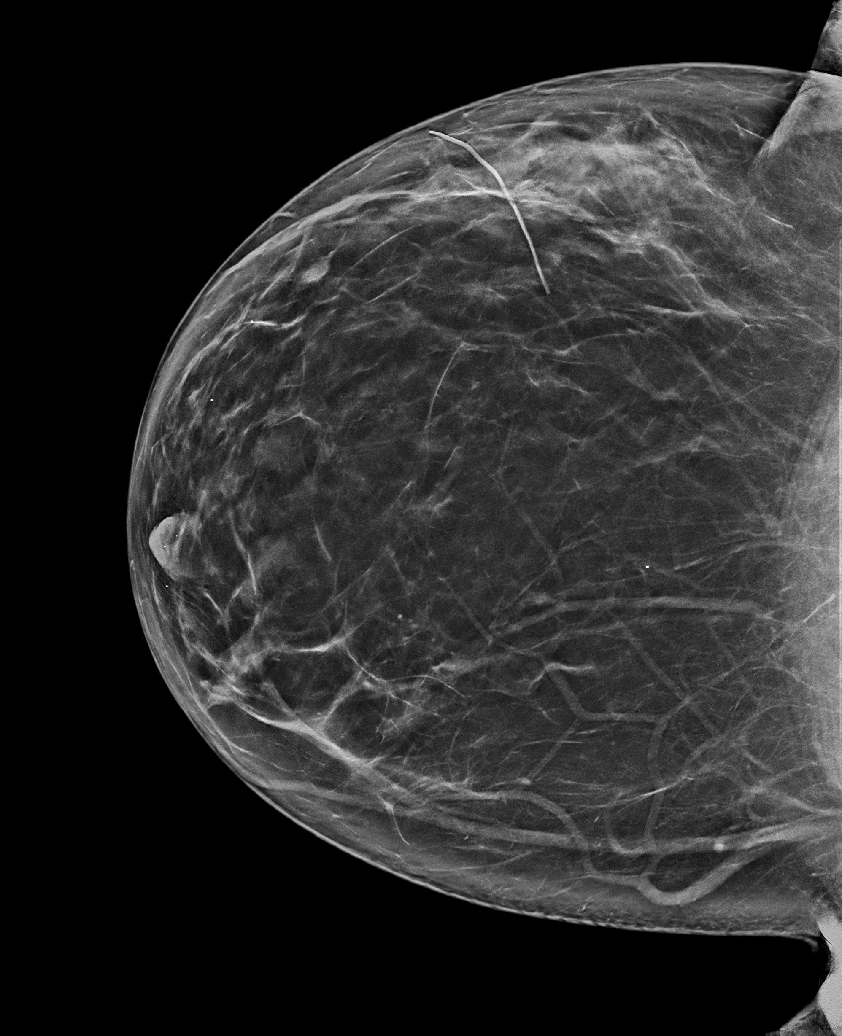

[L CC synth-2D (2 of 2)]
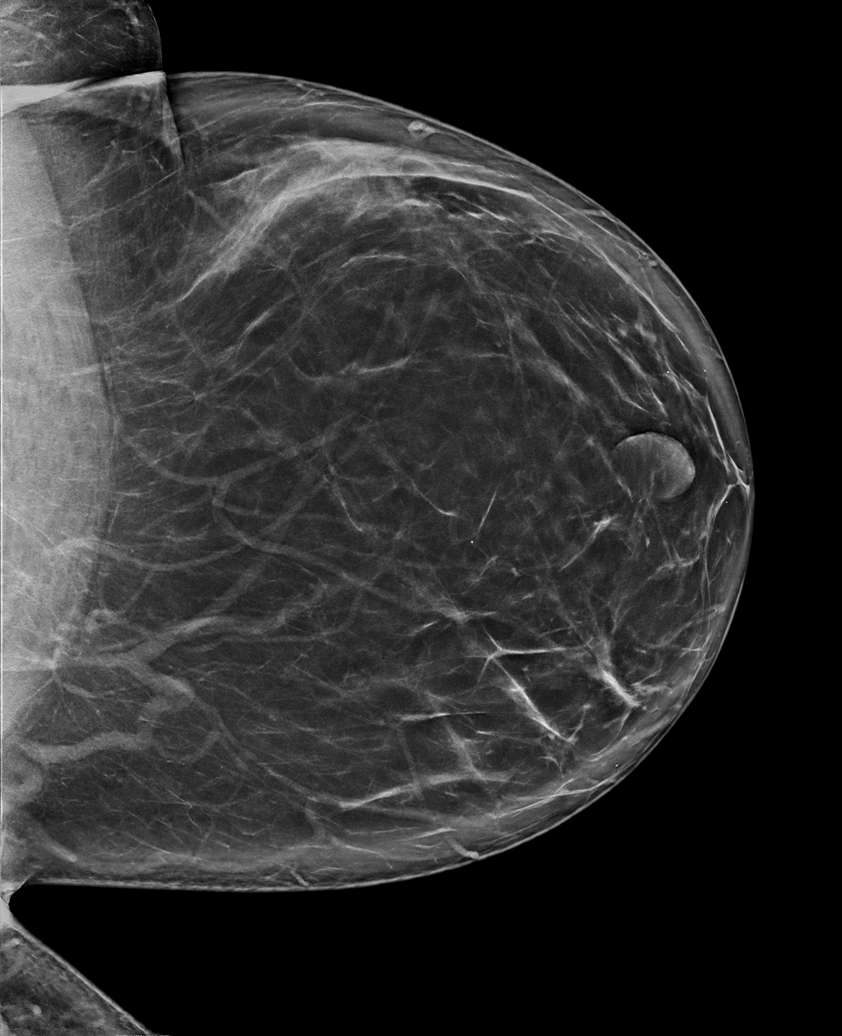

[R MLO tomo · tomo slice 49/97.0]
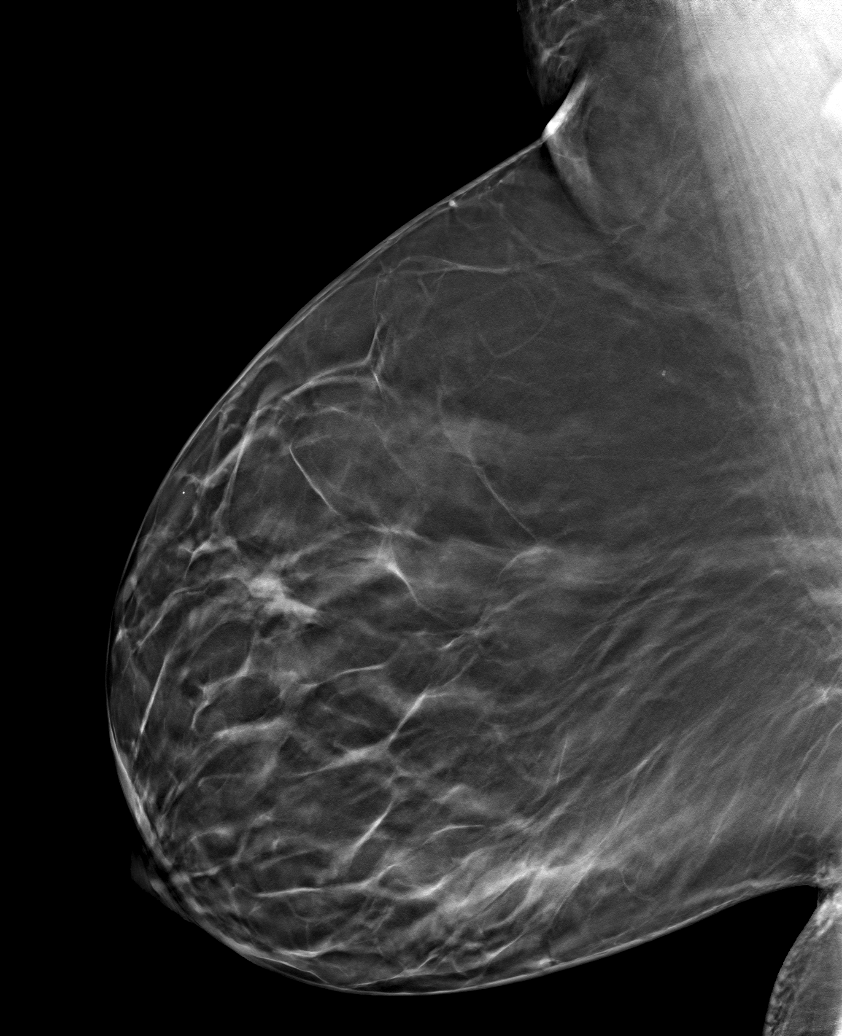

[6 of 30 positions shown; findings below may reference images not displayed]

ACR Breast Density Category b: There are scattered areas of
fibroglandular density.
FINDINGS: There are no findings suspicious for malignancy.
IMPRESSION: No mammographic evidence of malignancy. A result letter of this
screening mammogram will be mailed directly to the patient.

RECOMMENDATION:
Screening mammogram in one year. (Code:51-O-LD2)

BI-RADS CATEGORY  1: Negative.

## 2021-11-09 ENCOUNTER — Ambulatory Visit: Payer: BC Managed Care – PPO | Admitting: Internal Medicine

## 2021-11-30 ENCOUNTER — Ambulatory Visit
Admission: RE | Admit: 2021-11-30 | Discharge: 2021-11-30 | Disposition: A | Discharge status: Home / Self Care | Payer: BC Managed Care – PPO | Source: Ambulatory Visit | Attending: Obstetrics and Gynecology | Admitting: Obstetrics and Gynecology

## 2021-11-30 ENCOUNTER — Other Ambulatory Visit: Payer: Self-pay

## 2021-11-30 DIAGNOSIS — Z1231 Encounter for screening mammogram for malignant neoplasm of breast: Secondary | ICD-10-CM | POA: Insufficient documentation

## 2021-12-20 ENCOUNTER — Telehealth: Payer: Self-pay

## 2021-12-20 NOTE — Telephone Encounter (Signed)
Attempted to reach pt on her mobile phone twice to offer her a hospital date for her screening colonoscopy at [facility]. Pt's mailbox is full at this time and I was unable to leave a vm. Will attempt to reach the next pt on the wait list for Dr. Havery Moros. ?

## 2021-12-25 ENCOUNTER — Other Ambulatory Visit: Payer: Self-pay

## 2021-12-25 ENCOUNTER — Telehealth: Payer: Self-pay

## 2021-12-25 DIAGNOSIS — Z1211 Encounter for screening for malignant neoplasm of colon: Secondary | ICD-10-CM

## 2021-12-25 MED ORDER — SUTAB 1479-225-188 MG PO TABS: 1.0000 | ORAL_TABLET | Freq: Once | ORAL | 0 refills | Status: AC

## 2021-12-25 NOTE — Telephone Encounter (Signed)
Patient scheduled for colonoscopy with Dr. Havery Moros on 5-18 at 10:15am.  Instructions and prep sent. ?

## 2021-12-25 NOTE — Progress Notes (Signed)
Called and spoke to patient. She is agreeable to Colonoscopy at [facility] on 5-18.  Instructions will be sent via MyChart and mailed.  ?

## 2022-02-21 ENCOUNTER — Encounter (HOSPITAL_COMMUNITY): Payer: Self-pay | Admitting: Gastroenterology

## 2022-02-21 NOTE — Progress Notes (Signed)
Attempted to obtain medical history via telephone, unable to reach at this time. Unable to leave voicemail to return pre surgical testing department's phone call,due to mailbox full.  

## 2022-02-26 ENCOUNTER — Telehealth: Payer: Self-pay | Admitting: Gastroenterology

## 2022-02-26 NOTE — Telephone Encounter (Signed)
We received a call from patient wanting to reschedule procedure at [facility] 03/24/2022. Per patient, had a death in the family. Please advise. ?

## 2022-02-27 NOTE — Telephone Encounter (Signed)
[facility] procedures cancelled and patient added back to wait list.  ?

## 2022-02-28 ENCOUNTER — Ambulatory Visit (HOSPITAL_COMMUNITY)
Admission: RE | Admit: 2022-02-28 | Payer: BC Managed Care – PPO | Source: Home / Self Care | Admitting: Gastroenterology

## 2022-02-28 ENCOUNTER — Encounter (HOSPITAL_COMMUNITY): Admission: RE | Payer: Self-pay | Source: Home / Self Care

## 2022-02-28 SURGERY — COLONOSCOPY WITH PROPOFOL
Anesthesia: Monitor Anesthesia Care

## 2022-04-30 ENCOUNTER — Telehealth: Payer: Self-pay

## 2022-04-30 NOTE — Telephone Encounter (Signed)
Called mobile/home number but patient's vm was full.  Called and left message on patient's work phone to see if she is available to have her colonoscopy at [facility] on Thursday, 8-3 with Dr. Havery Moros. Asked that she call back asap to let us  know as we have a long list of people on the wait list.

## 2022-05-01 ENCOUNTER — Other Ambulatory Visit: Payer: Self-pay

## 2022-05-01 DIAGNOSIS — Z1211 Encounter for screening for malignant neoplasm of colon: Secondary | ICD-10-CM

## 2022-05-01 NOTE — Telephone Encounter (Signed)
Called and spoke to patient.  She said she IS available to have her colonoscopy on Thur. 8-3 at [facility] with Dr. Havery Moros.  Patient scheduled at 10:45 am to arr at 9:15am Case# [id number]. Patient scheduled for PV on Tuesday, 7-25 at 9am. She understands to come to the 2nd floor of our office.

## 2022-05-01 NOTE — Progress Notes (Signed)
Colonoscopy at [facility] on 8-3 Dr. Havery Moros

## 2022-05-07 ENCOUNTER — Ambulatory Visit (AMBULATORY_SURGERY_CENTER): Payer: Self-pay | Admitting: *Deleted

## 2022-05-07 VITALS — Ht 66.0 in | Wt 279.0 lb

## 2022-05-07 DIAGNOSIS — Z1211 Encounter for screening for malignant neoplasm of colon: Secondary | ICD-10-CM

## 2022-05-07 MED ORDER — NA SULFATE-K SULFATE-MG SULF 17.5-3.13-1.6 GM/177ML PO SOLN: 1.0000 | ORAL | 0 refills | Status: DC

## 2022-05-07 NOTE — Progress Notes (Signed)
No egg or soy allergy known to patient  No issues known to pt with past sedation with any surgeries or procedures Patient denies ever being told they had issues or difficulty with intubation  No FH of Malignant Hyperthermia Pt is not on diet pills Pt is not on  home 02  Pt is not on blood thinners  Pt denies issues with constipation  No A fib or A flutter Have any cardiac testing pending--denies Pt instructed to use [email or web address] or GoodRx for a price reduction on prep

## 2022-05-09 ENCOUNTER — Encounter (HOSPITAL_COMMUNITY): Payer: Self-pay | Admitting: Gastroenterology

## 2022-05-09 NOTE — Progress Notes (Signed)
Attempted to obtain medical history via telephone, unable to reach at this time. HIPAA compliant voicemail message left requesting return call to pre surgical testing department. 

## 2022-05-14 ENCOUNTER — Encounter: Payer: Self-pay | Admitting: Gastroenterology

## 2022-05-15 ENCOUNTER — Ambulatory Visit (INDEPENDENT_AMBULATORY_CARE_PROVIDER_SITE_OTHER): Payer: BC Managed Care – PPO | Admitting: Internal Medicine

## 2022-05-15 ENCOUNTER — Encounter: Payer: Self-pay | Admitting: Internal Medicine

## 2022-05-15 ENCOUNTER — Other Ambulatory Visit: Payer: Self-pay | Admitting: Internal Medicine

## 2022-05-15 VITALS — BP 128/70 | HR 85 | Temp 98.4°F | Ht 66.0 in | Wt 279.2 lb

## 2022-05-15 DIAGNOSIS — R062 Wheezing: Secondary | ICD-10-CM

## 2022-05-15 DIAGNOSIS — E538 Deficiency of other specified B group vitamins: Secondary | ICD-10-CM | POA: Diagnosis not present

## 2022-05-15 DIAGNOSIS — E559 Vitamin D deficiency, unspecified: Secondary | ICD-10-CM

## 2022-05-15 DIAGNOSIS — Z0000 Encounter for general adult medical examination without abnormal findings: Secondary | ICD-10-CM | POA: Diagnosis not present

## 2022-05-15 DIAGNOSIS — E611 Iron deficiency: Secondary | ICD-10-CM | POA: Diagnosis not present

## 2022-05-15 DIAGNOSIS — I10 Essential (primary) hypertension: Secondary | ICD-10-CM

## 2022-05-15 DIAGNOSIS — Z1329 Encounter for screening for other suspected endocrine disorder: Secondary | ICD-10-CM | POA: Diagnosis not present

## 2022-05-15 DIAGNOSIS — A630 Anogenital (venereal) warts: Secondary | ICD-10-CM | POA: Insufficient documentation

## 2022-05-15 DIAGNOSIS — R7303 Prediabetes: Secondary | ICD-10-CM

## 2022-05-15 DIAGNOSIS — J309 Allergic rhinitis, unspecified: Secondary | ICD-10-CM

## 2022-05-15 DIAGNOSIS — J40 Bronchitis, not specified as acute or chronic: Secondary | ICD-10-CM

## 2022-05-15 DIAGNOSIS — Z1231 Encounter for screening mammogram for malignant neoplasm of breast: Secondary | ICD-10-CM

## 2022-05-15 DIAGNOSIS — R059 Cough, unspecified: Secondary | ICD-10-CM

## 2022-05-15 HISTORY — DX: Anogenital (venereal) warts: A63.0

## 2022-05-15 LAB — COMPREHENSIVE METABOLIC PANEL
ALT: 16 U/L (ref 0–35)
AST: 17 U/L (ref 0–37)
Albumin: 4.2 g/dL (ref 3.5–5.2)
Alkaline Phosphatase: 66 U/L (ref 39–117)
BUN: 10 mg/dL (ref 6–23)
CO2: 27 mEq/L (ref 19–32)
Calcium: 9.3 mg/dL (ref 8.4–10.5)
Chloride: 101 mEq/L (ref 96–112)
Creatinine, Ser: 0.91 mg/dL (ref 0.40–1.20)
GFR: 74.46 mL/min (ref >60.00)
Glucose, Bld: 100 mg/dL — ABNORMAL HIGH (ref 70–99)
Potassium: 3.9 mEq/L (ref 3.5–5.1)
Sodium: 136 mEq/L (ref 135–145)
Total Bilirubin: 0.9 mg/dL (ref 0.2–1.2)
Total Protein: 6.8 g/dL (ref 6.0–8.3)

## 2022-05-15 LAB — HEMOGLOBIN A1C: Hgb A1c MFr Bld: 6.2 % (ref 4.6–6.5)

## 2022-05-15 LAB — CBC WITH DIFFERENTIAL/PLATELET
Basophils Absolute: 0 10*3/uL (ref 0.0–0.1)
Basophils Relative: 0.9 % (ref 0.0–3.0)
Eosinophils Absolute: 0.2 10*3/uL (ref 0.0–0.7)
Eosinophils Relative: 3.4 % (ref 0.0–5.0)
HCT: 38.8 % (ref 36.0–46.0)
Hemoglobin: 12.8 g/dL (ref 12.0–15.0)
Lymphocytes Relative: 32.7 % (ref 12.0–46.0)
Lymphs Abs: 1.7 10*3/uL (ref 0.7–4.0)
MCHC: 33 g/dL (ref 30.0–36.0)
MCV: 96.2 fl (ref 78.0–100.0)
Monocytes Absolute: 0.4 10*3/uL (ref 0.1–1.0)
Monocytes Relative: 8 % (ref 3.0–12.0)
Neutro Abs: 2.9 10*3/uL (ref 1.4–7.7)
Neutrophils Relative %: 55 % (ref 43.0–77.0)
Platelets: 233 10*3/uL (ref 150.0–400.0)
RBC: 4.03 Mil/uL (ref 3.87–5.11)
RDW: 14.4 % (ref 11.5–15.5)
WBC: 5.3 10*3/uL (ref 4.0–10.5)

## 2022-05-15 LAB — IBC + FERRITIN
Ferritin: 53.9 ng/mL (ref 10.0–291.0)
Iron: 82 ug/dL (ref 42–145)
Saturation Ratios: 24.8 % (ref 20.0–50.0)
TIBC: 330.4 ug/dL (ref 250.0–450.0)
Transferrin: 236 mg/dL (ref 212.0–360.0)

## 2022-05-15 LAB — LIPID PANEL
Cholesterol: 192 mg/dL (ref 0–200)
HDL: 69.5 mg/dL (ref >39.00)
LDL Cholesterol: 110 mg/dL — ABNORMAL HIGH (ref 0–99)
NonHDL: 122.04
Total CHOL/HDL Ratio: 3
Triglycerides: 62 mg/dL (ref 0.0–149.0)
VLDL: 12.4 mg/dL (ref 0.0–40.0)

## 2022-05-15 LAB — TSH: TSH: 1.85 u[IU]/mL (ref 0.35–5.50)

## 2022-05-15 LAB — VITAMIN D 25 HYDROXY (VIT D DEFICIENCY, FRACTURES): VITD: 15.19 ng/mL — ABNORMAL LOW (ref 30.00–100.00)

## 2022-05-15 LAB — VITAMIN B12: Vitamin B-12: 425 pg/mL (ref 211–911)

## 2022-05-15 MED ORDER — VITAMIN D (ERGOCALCIFEROL) 1.25 MG (50000 UNIT) PO CAPS: 50000.0000 [IU] | ORAL_CAPSULE | ORAL | 1 refills | Status: DC

## 2022-05-15 MED ORDER — LOSARTAN POTASSIUM 100 MG PO TABS: 100.0000 mg | ORAL_TABLET | Freq: Every day | ORAL | 3 refills | Status: DC

## 2022-05-15 MED ORDER — CARVEDILOL 12.5 MG PO TABS: 12.5000 mg | ORAL_TABLET | Freq: Two times a day (BID) | ORAL | 3 refills | Status: DC

## 2022-05-15 MED ORDER — VITAMIN D (ERGOCALCIFEROL) 1.25 MG (50000 UNIT) PO CAPS: 50000.0000 [IU] | ORAL_CAPSULE | ORAL | 2 refills | Status: DC

## 2022-05-15 MED ORDER — CYANOCOBALAMIN 1000 MCG/ML IJ SOLN: 1000.0000 ug | INTRAMUSCULAR | 4 refills | Status: DC

## 2022-05-15 MED ORDER — LORATADINE 10 MG PO TABS
10.0000 mg | ORAL_TABLET | Freq: Every day | ORAL | 3 refills | Status: DC | PRN
Start: 2022-05-15 — End: 2022-12-03

## 2022-05-15 MED ORDER — BUDESONIDE-FORMOTEROL FUMARATE 80-4.5 MCG/ACT IN AERO: 2.0000 | INHALATION_SPRAY | Freq: Two times a day (BID) | RESPIRATORY_TRACT | 12 refills | Status: DC

## 2022-05-15 MED ORDER — IRON 325 (65 FE) MG PO TABS
1.0000 | ORAL_TABLET | Freq: Every day | ORAL | 3 refills | Status: DC
Start: 2022-05-15 — End: 2022-12-03

## 2022-05-15 MED ORDER — FUROSEMIDE 20 MG PO TABS: 20.0000 mg | ORAL_TABLET | Freq: Every day | ORAL | 11 refills | Status: DC

## 2022-05-15 MED ORDER — MONTELUKAST SODIUM 10 MG PO TABS: ORAL_TABLET | ORAL | 3 refills | Status: DC

## 2022-05-15 MED ORDER — ALBUTEROL SULFATE HFA 108 (90 BASE) MCG/ACT IN AERS: 1.0000 | INHALATION_SPRAY | Freq: Four times a day (QID) | RESPIRATORY_TRACT | 12 refills | Status: DC | PRN

## 2022-05-15 MED ORDER — "SYRINGE/NEEDLE (DISP) 25G X 1-1/2"" 5 ML MISC": 1.0000 | 4 refills | Status: DC

## 2022-05-15 MED ORDER — FLUTICASONE PROPIONATE 50 MCG/ACT NA SUSP: 2.0000 | Freq: Every day | NASAL | 11 refills | Status: DC

## 2022-05-15 NOTE — Patient Instructions (Addendum)
Call for cardiology f/u   [facility]   [place], [place] [address]   [phone]   [address], [place],  [address]   [phone] (Work)   [phone] (Fax)     Dr. Volanda Napoleon  Dr. Stacy Gardner -they've been here the longest

## 2022-05-15 NOTE — Progress Notes (Signed)
Chief Complaint  Patient presents with   Follow-up   Annual  1. Htn controlled on meds coreg 12.5 mg bid, lasix 20 mg qd prn losartan 100 mg qd  2. Fatigue b12, iron and vit D def check labs today  3. Vaginal warts established Dr. Phillip Heal in [facility] will ask if ob/gyn will tx or derm     Review of Systems  Constitutional:  Negative for weight loss.  HENT:  Negative for hearing loss.   Eyes:  Negative for blurred vision.  Respiratory:  Negative for shortness of breath.   Cardiovascular:  Negative for chest pain.  Gastrointestinal:  Negative for abdominal pain and blood in stool.  Genitourinary:  Negative for dysuria.  Musculoskeletal:  Negative for falls and joint pain.  Skin:  Negative for rash.  Neurological:  Negative for headaches.  Psychiatric/Behavioral:  Negative for depression.    Past Medical History:  Diagnosis Date   Allergic rhinitis, seasonal    Allergy    Bronchitis    Cardiomyopathy ([place])    post partum   CHF (congestive heart failure) ([place])    pregnancy related 2010   Endometriosis    Fibroid    History of CHF (congestive heart failure)    Hordeolum externum of left eye    HSV-2 infection    HTN, goal below 140/90    Left ear pain    Obesity, Class III, BMI 40-49.9 (morbid obesity) ([place])    Vitamin D deficiency    Past Surgical History:  Procedure Laterality Date   BREAST EXCISIONAL BIOPSY Right 1994   Negative   CESAREAN SECTION     x's 2   COLPOSCOPY  12/2008   Dr. Kenton Kingfisher, follow-up ASCUS   CYSTOSCOPY  07/20/2021   Procedure: CYSTOSCOPY;  Surgeon: Benjaman Kindler, MD;  Location: [facility];  Service: Gynecology;;   DILITATION & CURRETTAGE/HYSTROSCOPY WITH NOVASURE ABLATION N/A 01/22/2021   Procedure: DILATATION & CURETTAGE/HYSTEROSCOPY;  Surgeon: Benjaman Kindler, MD;  Location: [facility];  Service: Gynecology;  Laterality: N/A;   HYSTEROSCOPY N/A 11/13/2018   Procedure: HYSTEROSCOPY, remove/ replace Mirena;  Surgeon: Ward, Honor Loh, MD;  Location:  [facility];  Service: Gynecology;  Laterality: N/A;   INTRAUTERINE DEVICE INSERTION  12/2013   IUD REMOVAL N/A 01/22/2021   Procedure: INTRAUTERINE DEVICE (IUD) REMOVAL;  Surgeon: Benjaman Kindler, MD;  Location: [facility];  Service: Gynecology;  Laterality: N/A;   LAPAROSCOPIC BILATERAL SALPINGECTOMY Bilateral 01/22/2021   Procedure: LAPAROSCOPIC BILATERAL SALPINGECTOMY;  Surgeon: Benjaman Kindler, MD;  Location: [facility];  Service: Gynecology;  Laterality: Bilateral;   LYSIS OF ADHESION N/A 07/20/2021   Procedure: LYSIS OF ADHESION;  Surgeon: Benjaman Kindler, MD;  Location: [facility];  Service: Gynecology;  Laterality: N/A;   TOTAL LAPAROSCOPIC HYSTERECTOMY WITH SALPINGECTOMY Bilateral 07/20/2021   Procedure: TOTAL LAPAROSCOPIC HYSTERECTOMY WITH SALPINGECTOMY;  Surgeon: Benjaman Kindler, MD;  Location: [facility];  Service: Gynecology;  Laterality: Bilateral;   Family History  Problem Relation Age of Onset   Cancer Mother        breast dx'ed in 33s    Breast cancer Mother 26   Prostate cancer Father    Hypertension Father    Diabetes Father    Cancer Father        prostate cancer    Cancer Maternal Grandmother        breast dx later in life    Breast cancer Maternal Grandmother    Breast cancer Paternal Grandmother    Cancer Paternal Grandmother  breast cancer dx'ed later in life    Colon cancer Neg Hx    Pancreatic cancer Neg Hx    Esophageal cancer Neg Hx    Rectal cancer Neg Hx    Social History   Socioeconomic History   Marital status: Single    Spouse name: Not on file   Number of children: 2   Years of education: Not on file   Highest education level: Not on file  Occupational History   Occupation: buyer for [employer]  Tobacco Use   Smoking status: Former    Packs/day: 0.25    Types: Cigarettes    Quit date: 11/13/2008    Years since quitting: 13.5   Smokeless tobacco: Never   Tobacco comments:    quit in 2010. Used to smoke 1/2 PPD.  Vaping Use   Vaping Use: Never  used  Substance and Sexual Activity   Alcohol use: Yes    Comment: occassional   Drug use: No   Sexual activity: Yes    Partners: Male    Birth control/protection: Condom  Other Topics Concern   Not on file  Social History Narrative   Works [employer], former Artist    Social Determinants of Health   Financial Resource Strain: Not on file  Food Insecurity: Not on file  Transportation Needs: Not on file  Physical Activity: Not on file  Stress: Not on file  Social Connections: Not on file  Intimate Partner Violence: Not on file   Current Meds  Medication Sig   Multiple Vitamin (MULTIVITAMIN WITH MINERALS) TABS tablet Take 1 tablet by mouth daily.   Na Sulfate-K Sulfate-Mg Sulf 17.5-3.13-1.6 GM/177ML SOLN Take 1 kit by mouth as directed. May use generic Suprep, no prior authorization. Take as directed.   Polyethylene Glycol 400 (BLINK TEARS) 0.25 % SOLN Place 1 drop into both eyes daily as needed (dry eyes).   Probiotic Product (PROBIOTIC-10) CAPS Take 1 capsule by mouth daily.    [DISCONTINUED] albuterol (VENTOLIN HFA) 108 (90 Base) MCG/ACT inhaler Inhale 1-2 puffs into the lungs every 6 (six) hours as needed for wheezing or shortness of breath.   [DISCONTINUED] budesonide-formoterol (SYMBICORT) 80-4.5 MCG/ACT inhaler Inhale 2 puffs into the lungs 2 (two) times daily. Rinse mouth out   [DISCONTINUED] carvedilol (COREG) 12.5 MG tablet Take 1 tablet (12.5 mg total) by mouth 2 (two) times daily with a meal.   [DISCONTINUED] cyanocobalamin (,VITAMIN B-12,) 1000 MCG/ML injection Inject 1 mL (1,000 mcg total) into the muscle every 30 (thirty) days.   [DISCONTINUED] Ferrous Sulfate (IRON) 325 (65 Fe) MG TABS Take by mouth.   [DISCONTINUED] fluticasone (FLONASE) 50 MCG/ACT nasal spray Place 2 sprays into both nostrils daily.   [DISCONTINUED] furosemide (LASIX) 20 MG tablet Take 1 tablet (20 mg total) by mouth daily.   [DISCONTINUED] loratadine (CLARITIN) 10 MG tablet TAKE 1 TABLET BY MOUTH  EVERY DAY AS NEEDED   [DISCONTINUED] losartan (COZAAR) 100 MG tablet TAKE 1 TABLET BY MOUTH EVERY DAY   [DISCONTINUED] montelukast (SINGULAIR) 10 MG tablet TAKE 1 TABLET BY MOUTH EVERYDAY AT BEDTIME   [DISCONTINUED] Syringe/Needle, Disp, 25G X 1-1/2" 5 ML MISC 1 Device by Does not apply route every 30 (thirty) days.   [DISCONTINUED] Vitamin D, Ergocalciferol, (DRISDOL) 1.25 MG (50000 UNIT) CAPS capsule Take 1 capsule (50,000 Units total) by mouth every 7 (seven) days. (Patient taking differently: Take 50,000 Units by mouth every 7 (seven) days. Monday)   Allergies  Allergen Reactions   Penicillin V Rash  No results found for this or any previous visit (from the past 2160 hour(s)). Objective  Body mass index is 45.06 kg/m. Wt Readings from Last 3 Encounters:  05/15/22 279 lb 3.2 oz (126.6 kg)  05/07/22 279 lb (126.6 kg)  07/20/21 294 lb (133.4 kg)   Temp Readings from Last 3 Encounters:  05/15/22 98.4 F (36.9 C) (Oral)  07/20/21 (!) 96.5 F (35.8 C) (Temporal)  05/09/21 98.1 F (36.7 C) (Oral)   BP Readings from Last 3 Encounters:  05/15/22 128/70  07/20/21 (!) 157/95  07/11/21 126/62   Pulse Readings from Last 3 Encounters:  05/15/22 85  07/20/21 72  07/11/21 86    Physical Exam Vitals and nursing note reviewed.  Constitutional:      Appearance: Normal appearance. She is well-developed and well-groomed.  HENT:     Head: Normocephalic and atraumatic.  Eyes:     Conjunctiva/sclera: Conjunctivae normal.     Pupils: Pupils are equal, round, and reactive to light.  Cardiovascular:     Rate and Rhythm: Normal rate and regular rhythm.     Heart sounds: Normal heart sounds. No murmur heard. Pulmonary:     Effort: Pulmonary effort is normal.     Breath sounds: Normal breath sounds.  Abdominal:     General: Abdomen is flat. Bowel sounds are normal.     Tenderness: There is no abdominal tenderness.  Musculoskeletal:        General: No tenderness.  Skin:    General:  Skin is warm and dry.  Neurological:     General: No focal deficit present.     Mental Status: She is alert and oriented to person, place, and time. Mental status is at baseline.     Cranial Nerves: Cranial nerves 2-12 are intact.     Motor: Motor function is intact.     Coordination: Coordination is intact.     Gait: Gait is intact.  Psychiatric:        Attention and Perception: Attention and perception normal.        Mood and Affect: Mood and affect normal.        Speech: Speech normal.        Behavior: Behavior normal. Behavior is cooperative.        Thought Content: Thought content normal.        Cognition and Memory: Cognition and memory normal.        Judgment: Judgment normal.     Assessment  Plan  Annual physical exam See below   Prediabetes - Plan: Hemoglobin A1c  Essential hypertension - Plan: Comprehensive metabolic panel, Lipid panel, CBC with Differential/Platelet, losartan (COZAAR) 100 MG tablet coreg 12.5 mg bid, lasix 20 mg qd prn losartan 100 mg qd   Vitamin D deficiency - Plan: Vitamin D (25 hydroxy)  B12 deficiency - Plan: Vitamin B12, cyanocobalamin (VITAMIN B12) 1000 MCG/ML injection, Syringe/Needle, Disp, 25G X 1-1/2" 5 ML MISC  Iron deficiency - Plan: IBC + Ferritin   Bronchitis - Plan: albuterol (VENTOLIN HFA) 108 (90 Base) MCG/ACT inhaler, budesonide-formoterol (SYMBICORT) 80-4.5 MCG/ACT inhaler  Cough - Plan: albuterol (VENTOLIN HFA) 108 (90 Base) MCG/ACT inhaler  Wheezing - Plan: albuterol (VENTOLIN HFA) 108 (90 Base) MCG/ACT inhaler  Allergic rhinitis, unspecified seasonality, unspecified trigger - Plan: fluticasone (FLONASE) 50 MCG/ACT nasal spray, montelukast (SINGULAIR) 10 MG tablet  Vaginal venereal warts   HM Flu will do in future  Tdap and hep Bx 3 doses utd, hep B immune covid 3/3  pfizer  consider booster    [facility] pap 11/06/18 neg neg HPV  May do ablation if fails hysterectomy as of 11/09/20 plan hysterectomy 07/06/21    mammo  09/24/18 neg insurance would not cover genetic testing for breast cancer as mom and both grandmothers dx'ed breast cancer at older age per pt  -mammo neg 11/30/21 negative ordered 11/2022   Dad h/o prostate cancer -referred colonoscopy age 4 having 12/03/19  -referred [facility] 06/29/21  Colonoscopy 05/16/22    -established dermatology 2 lesions behind left ear appt 10/2018, has not seen Dr. Phillip Heal in [place]   cards 08/2021 dr. Clayborn Bigness   rec healthy diet and exercise   Provider: Dr. Olivia Mackie McLean-Scocuzza-Internal Medicine

## 2022-05-16 ENCOUNTER — Ambulatory Visit (HOSPITAL_COMMUNITY): Payer: BC Managed Care – PPO | Admitting: Certified Registered"

## 2022-05-16 ENCOUNTER — Other Ambulatory Visit: Payer: Self-pay

## 2022-05-16 ENCOUNTER — Encounter: Payer: Self-pay | Admitting: Internal Medicine

## 2022-05-16 ENCOUNTER — Encounter (HOSPITAL_COMMUNITY)
Admission: RE | Disposition: A | Discharge status: Home / Self Care | Payer: Self-pay | Source: Home / Self Care | Attending: Gastroenterology

## 2022-05-16 ENCOUNTER — Encounter (HOSPITAL_COMMUNITY): Payer: Self-pay | Admitting: Gastroenterology

## 2022-05-16 ENCOUNTER — Ambulatory Visit (HOSPITAL_COMMUNITY)
Admission: RE | Admit: 2022-05-16 | Discharge: 2022-05-16 | Disposition: A | Discharge status: Home / Self Care | Payer: BC Managed Care – PPO | Attending: Gastroenterology | Admitting: Gastroenterology

## 2022-05-16 DIAGNOSIS — I509 Heart failure, unspecified: Secondary | ICD-10-CM | POA: Diagnosis not present

## 2022-05-16 DIAGNOSIS — Z87891 Personal history of nicotine dependence: Secondary | ICD-10-CM | POA: Insufficient documentation

## 2022-05-16 DIAGNOSIS — Z90710 Acquired absence of both cervix and uterus: Secondary | ICD-10-CM | POA: Insufficient documentation

## 2022-05-16 DIAGNOSIS — Z1211 Encounter for screening for malignant neoplasm of colon: Secondary | ICD-10-CM | POA: Insufficient documentation

## 2022-05-16 DIAGNOSIS — I110 Hypertensive heart disease with heart failure: Secondary | ICD-10-CM | POA: Diagnosis not present

## 2022-05-16 DIAGNOSIS — Z6841 Body Mass Index (BMI) 40.0 and over, adult: Secondary | ICD-10-CM | POA: Diagnosis not present

## 2022-05-16 DIAGNOSIS — D123 Benign neoplasm of transverse colon: Secondary | ICD-10-CM | POA: Diagnosis not present

## 2022-05-16 DIAGNOSIS — D122 Benign neoplasm of ascending colon: Secondary | ICD-10-CM | POA: Diagnosis not present

## 2022-05-16 DIAGNOSIS — D126 Benign neoplasm of colon, unspecified: Secondary | ICD-10-CM

## 2022-05-16 DIAGNOSIS — K648 Other hemorrhoids: Secondary | ICD-10-CM | POA: Diagnosis not present

## 2022-05-16 HISTORY — PX: COLONOSCOPY WITH PROPOFOL: SHX5780

## 2022-05-16 HISTORY — PX: POLYPECTOMY: SHX5525

## 2022-05-16 SURGERY — COLONOSCOPY WITH PROPOFOL
Anesthesia: Monitor Anesthesia Care

## 2022-05-16 MED ORDER — PROPOFOL 500 MG/50ML IV EMUL
INTRAVENOUS | Status: DC | PRN
  Administered 2022-05-16: 150 ug/kg/min via INTRAVENOUS

## 2022-05-16 MED ORDER — LACTATED RINGERS IV SOLN: INTRAVENOUS | Status: DC

## 2022-05-16 MED ORDER — PROPOFOL 10 MG/ML IV BOLUS
INTRAVENOUS | Status: DC | PRN
  Administered 2022-05-16 (×2): 20 mg via INTRAVENOUS

## 2022-05-16 MED ORDER — SODIUM CHLORIDE 0.9 % IV SOLN: INTRAVENOUS | Status: DC

## 2022-05-16 MED ORDER — LIDOCAINE 2% (20 MG/ML) 5 ML SYRINGE
INTRAMUSCULAR | Status: DC | PRN
  Administered 2022-05-16: 60 mg via INTRAVENOUS

## 2022-05-16 SURGICAL SUPPLY — 22 items

## 2022-05-16 NOTE — Anesthesia Procedure Notes (Signed)
Procedure Name: MAC Date/Time: 05/16/2022 10:38 AM  Performed by: Eben Burow, CRNAPre-anesthesia Checklist: Patient identified, Emergency Drugs available, Suction available, Patient being monitored and Timeout performed Oxygen Delivery Method: Simple face mask Placement Confirmation: positive ETCO2

## 2022-05-16 NOTE — Anesthesia Preprocedure Evaluation (Signed)
Anesthesia Evaluation  Patient identified by MRN, date of birth, ID band Patient awake    Reviewed: Allergy & Precautions, NPO status , Patient's Chart, lab work & pertinent test results  Airway Mallampati: III  TM Distance: >3 FB Neck ROM: Full    Dental no notable dental hx.    Pulmonary neg pulmonary ROS, former smoker,    Pulmonary exam normal        Cardiovascular hypertension, Pt. on medications +CHF   Rhythm:Regular Rate:Normal     Neuro/Psych negative neurological ROS  negative psych ROS   GI/Hepatic Neg liver ROS, CRC screening   Endo/Other  Morbid obesity  Renal/GU negative Renal ROS  negative genitourinary   Musculoskeletal   Abdominal Normal abdominal exam  (+)   Peds  Hematology negative hematology ROS (+)   Anesthesia Other Findings   Reproductive/Obstetrics                             Anesthesia Physical Anesthesia Plan  ASA: 3  Anesthesia Plan: MAC   Post-op Pain Management:    Induction: Intravenous  PONV Risk Score and Plan: 2 and Propofol infusion and Treatment may vary due to age or medical condition  Airway Management Planned: Simple Face Mask, Natural Airway and Nasal Cannula  Additional Equipment: None  Intra-op Plan:   Post-operative Plan:   Informed Consent: I have reviewed the patients History and Physical, chart, labs and discussed the procedure including the risks, benefits and alternatives for the proposed anesthesia with the patient or authorized representative who has indicated his/her understanding and acceptance.     Dental advisory given  Plan Discussed with: CRNA  Anesthesia Plan Comments:         Anesthesia Quick Evaluation

## 2022-05-16 NOTE — Op Note (Signed)
[facility] Patient Name: Heather Case Procedure Date: 05/16/2022 MRN: [id number] Attending MD: Carlota Raspberry. Havery Moros , MD Date of Birth: July 12, 1971 CSN: [id number] Age: 51 Admit Type: Outpatient Procedure:                Colonoscopy Indications:              Screening for colorectal malignant neoplasm, This                            is the patient's first colonoscopy. Case done at                            the hospital for difficult intubation. Providers:                Carlota Raspberry. Havery Moros, MD, Heather Gitelman, RN, Frazier Richards, Technician Referring MD:              Medicines:                Monitored Anesthesia Care Complications:            No immediate complications. Estimated blood loss:                            Minimal. Estimated Blood Loss:     Estimated blood loss was minimal. Procedure:                Pre-Anesthesia Assessment:                           - Prior to the procedure, a History and Physical                            was performed, and patient medications and                            allergies were reviewed. The patient's tolerance of                            previous anesthesia was also reviewed. The risks                            and benefits of the procedure and the sedation                            options and risks were discussed with the patient.                            All questions were answered, and informed consent                            was obtained. Prior Anticoagulants: The patient has                            taken  no previous anticoagulant or antiplatelet                            agents. ASA Grade Assessment: III - A patient with                            severe systemic disease. After reviewing the risks                            and benefits, the patient was deemed in                            satisfactory condition to undergo the procedure.                           After obtaining  informed consent, the colonoscope                            was passed under direct vision. Throughout the                            procedure, the patient's blood pressure, pulse, and                            oxygen saturations were monitored continuously. The                            [id number]-HQ190L ([id number]) Olympus colonoscope was                            introduced through the anus and advanced to the the                            cecum, identified by appendiceal orifice and                            ileocecal valve. The colonoscopy was performed                            without difficulty. The patient tolerated the                            procedure well. The quality of the bowel                            preparation was adequate. The ileocecal valve,                            appendiceal orifice, and rectum were photographed. Scope In: 10:42:48 AM Scope Out: 11:04:24 AM Scope Withdrawal Time: 0 hours 17 minutes 4 seconds  Total Procedure Duration: 0 hours 21 minutes 36 seconds  Findings:      The perianal and digital rectal examinations were normal.      A 3 to 4 mm polyp was found in the  ascending colon. The polyp was       sessile. The polyp was removed with a cold snare. Resection and       retrieval were complete.      A 4 mm polyp was found in the transverse colon. The polyp was sessile.       The polyp was removed with a cold snare. Resection and retrieval were       complete.      Internal hemorrhoids were found during retroflexion.      The exam was otherwise without abnormality. Impression:               - One 3 to 4 mm polyp in the ascending colon,                            removed with a cold snare. Resected and retrieved.                           - One 4 mm polyp in the transverse colon, removed                            with a cold snare. Resected and retrieved.                           - Internal hemorrhoids.                           - The  examination was otherwise normal. Moderate Sedation:      No moderate sedation, case performed with MAC Recommendation:           - Patient has a contact number available for                            emergencies. The signs and symptoms of potential                            delayed complications were discussed with the                            patient. Return to normal activities tomorrow.                            Written discharge instructions were provided to the                            patient.                           - Resume previous diet.                           - Continue present medications.                           - Await pathology results. Procedure Code(s):        --- Professional ---  45385, Colonoscopy, flexible; with removal of                            tumor(s), polyp(s), or other lesion(s) by snare                            technique Diagnosis Code(s):        --- Professional ---                           Z12.11, Encounter for screening for malignant                            neoplasm of colon                           K63.5, Polyp of colon                           K64.8, Other hemorrhoids CPT copyright 2019 [employer]. All rights reserved. The codes documented in this report are preliminary and upon coder review may  be revised to meet current compliance requirements. Remo Lipps P. Silver Achey, MD 05/16/2022 11:13:10 AM This report has been signed electronically. Number of Addenda: 0

## 2022-05-16 NOTE — H&P (Signed)
[facility] History and Physical   Primary Care Physician:  McLean-Scocuzza, Nino Glow, MD   Reason for Procedure:   Colon cancer screening  Plan:    colonoscopy     HPI: Heather Case is a 51 y.o. female  here for colonoscopy screening - first time exam. Patient denies any bowel symptoms at this time. No family history of colon cancer known. Otherwise feels well without any cardiopulmonary symptoms. Her case is being done at the hospital as she has a reported difficult airway for intubation in the past. She previously had IDA related to menses and is s/p hysterectomy with resolution of her anemia.  I have discussed risks / benefits of anesthesia and endoscopic procedure with Heather Case and they wish to proceed with the exams as outlined today.    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 Kenton Kingfisher, follow-up ASCUS   CYSTOSCOPY  07/20/2021   Procedure: CYSTOSCOPY;  Surgeon: Benjaman Kindler, MD;  Location: [facility];  Service: Gynecology;;   DILITATION & CURRETTAGE/HYSTROSCOPY WITH NOVASURE ABLATION N/A 01/22/2021   Procedure: DILATATION & CURETTAGE/HYSTEROSCOPY;  Surgeon: Benjaman Kindler, MD;  Location: [facility];  Service: Gynecology;  Laterality: N/A;   HYSTEROSCOPY N/A 11/13/2018   Procedure: HYSTEROSCOPY, remove/ replace Mirena;  Surgeon: Ward, Honor Loh, MD;  Location: [facility];  Service:  Gynecology;  Laterality: N/A;   INTRAUTERINE DEVICE INSERTION  12/2013   IUD REMOVAL N/A 01/22/2021   Procedure: INTRAUTERINE DEVICE (IUD) REMOVAL;  Surgeon: Benjaman Kindler, MD;  Location: [facility];  Service: Gynecology;  Laterality: N/A;   LAPAROSCOPIC BILATERAL SALPINGECTOMY Bilateral 01/22/2021   Procedure: LAPAROSCOPIC BILATERAL SALPINGECTOMY;  Surgeon: Benjaman Kindler, MD;  Location: [facility];  Service: Gynecology;  Laterality: Bilateral;   LYSIS OF ADHESION N/A 07/20/2021   Procedure: LYSIS OF ADHESION;  Surgeon: Benjaman Kindler, MD;  Location: [facility];  Service: Gynecology;  Laterality: N/A;   TOTAL LAPAROSCOPIC HYSTERECTOMY WITH SALPINGECTOMY Bilateral 07/20/2021   Procedure: TOTAL LAPAROSCOPIC HYSTERECTOMY WITH SALPINGECTOMY;  Surgeon: Benjaman Kindler, MD;  Location: [facility];  Service: Gynecology;  Laterality: Bilateral;    Prior to Admission medications   Medication Sig Start Date End Date Taking? Authorizing Provider  budesonide-formoterol (SYMBICORT) 80-4.5 MCG/ACT inhaler Inhale 2 puffs into the lungs 2 (two) times daily. Rinse mouth out 05/15/22  Yes McLean-Scocuzza, Nino Glow, MD  carvedilol (COREG) 12.5 MG tablet Take 1 tablet (12.5 mg total) by mouth 2 (two) times daily with a meal. 05/15/22  Yes McLean-Scocuzza, Nino Glow, MD  Ferrous Sulfate (IRON) 325 (65 Fe) MG TABS Take 1 tablet (325 mg total) by mouth daily. With vitamin C 250/500 05/15/22  Yes McLean-Scocuzza, Nino Glow, MD  furosemide (LASIX) 20 MG tablet Take 1 tablet (20 mg  total) by mouth daily. 05/15/22  Yes McLean-Scocuzza, Nino Glow, MD  loratadine (CLARITIN) 10 MG tablet Take 1 tablet (10 mg total) by mouth daily as needed. 05/15/22  Yes McLean-Scocuzza, Nino Glow, MD  losartan (COZAAR) 100 MG tablet Take 1 tablet (100 mg total) by mouth daily. 05/15/22  Yes McLean-Scocuzza, Nino Glow, MD  montelukast (SINGULAIR) 10 MG tablet TAKE 1 TABLET BY MOUTH EVERYDAY AT BEDTIME 05/15/22  Yes McLean-Scocuzza, Nino Glow, MD  Multiple Vitamin  (MULTIVITAMIN WITH MINERALS) TABS tablet Take 1 tablet by mouth daily.   Yes [provider]  Na Sulfate-K Sulfate-Mg Sulf 17.5-3.13-1.6 GM/177ML SOLN Take 1 kit by mouth as directed. May use generic Suprep, no prior authorization. Take as directed. 05/07/22  Yes Artelia Game, Carlota Raspberry, MD  Vitamin D, Ergocalciferol, (DRISDOL) 1.25 MG (50000 UNIT) CAPS capsule Take 1 capsule (50,000 Units total) by mouth every 7 (seven) days. D3 05/15/22  Yes McLean-Scocuzza, Nino Glow, MD  albuterol (VENTOLIN HFA) 108 (90 Base) MCG/ACT inhaler Inhale 1-2 puffs into the lungs every 6 (six) hours as needed for wheezing or shortness of breath. 05/15/22   McLean-Scocuzza, Nino Glow, MD  cyanocobalamin (VITAMIN B12) 1000 MCG/ML injection Inject 1 mL (1,000 mcg total) into the muscle every 30 (thirty) days. 05/15/22   McLean-Scocuzza, Nino Glow, MD  fluticasone (FLONASE) 50 MCG/ACT nasal spray Place 2 sprays into both nostrils daily. 05/15/22   McLean-Scocuzza, Nino Glow, MD  Polyethylene Glycol 400 (BLINK TEARS) 0.25 % SOLN Place 1 drop into both eyes daily as needed (dry eyes).    [provider]  Probiotic Product (PROBIOTIC-10) CAPS Take 1 capsule by mouth daily.     [provider]  Syringe/Needle, Disp, 25G X 1-1/2" 5 ML MISC 1 Device by Does not apply route every 30 (thirty) days. 05/15/22   McLean-Scocuzza, Nino Glow, MD    Current Facility-Administered Medications  Medication Dose Route Frequency Provider Last Rate Last Admin   0.9 %  sodium chloride infusion   Intravenous Continuous Marye Eagen, Carlota Raspberry, MD       lactated ringers infusion   Intravenous Continuous Konstantina Nachreiner, Carlota Raspberry, MD        Allergies as of 05/01/2022 - Review Complete 02/21/2022  Allergen Reaction Noted   Penicillin v Rash 06/13/2015    Family History  Problem Relation Age of Onset   Cancer Mother        breast dx'ed in 72s    Breast cancer Mother 7   Prostate cancer Father    Hypertension Father    Diabetes Father     Cancer Father        prostate cancer    Cancer Maternal Grandmother        breast dx later in life    Breast cancer Maternal Grandmother    Breast cancer Paternal Grandmother    Cancer Paternal Grandmother        breast cancer dx'ed later in life    Colon cancer Neg Hx    Pancreatic cancer Neg Hx    Esophageal cancer Neg Hx    Rectal cancer Neg Hx     Social History   Socioeconomic History   Marital status: Single    Spouse name: Not on file   Number of children: 2   Years of education: Not on file   Highest education level: Not on file  Occupational History   Occupation: buyer for [employer]  Tobacco Use   Smoking status: Former    Packs/day: 0.25  Types: Cigarettes    Quit date: 11/13/2008    Years since quitting: 13.5   Smokeless tobacco: Never   Tobacco comments:    quit in 2010. Used to smoke 1/2 PPD.  Vaping Use   Vaping Use: Never used  Substance and Sexual Activity   Alcohol use: Yes    Comment: occassional   Drug use: No   Sexual activity: Yes    Partners: Male    Birth control/protection: Condom  Other Topics Concern   Not on file  Social History Narrative   Works [employer], former Artist    Social Determinants of Health   Financial Resource Strain: Not on file  Food Insecurity: Not on file  Transportation Needs: Not on file  Physical Activity: Not on file  Stress: Not on file  Social Connections: Not on file  Intimate Partner Violence: Not on file    Review of Systems: All other review of systems negative except as mentioned in the HPI.  Physical Exam: Vital signs BP 138/66   Pulse 62   Temp 97.9 F (36.6 C) (Oral)   Resp 14   Ht '5\' 6"'  (1.676 m)   Wt 126.6 kg   LMP 07/02/2021   SpO2 96%   BMI 45.05 kg/m   General:   Alert,  Well-developed, pleasant and cooperative in NAD Lungs:  Clear throughout to auscultation.   Heart:  Regular rate and rhythm Abdomen:  Soft, nontender and nondistended.   Neuro/Psych:  Alert and cooperative. Normal  mood and affect. A and O x 3  Heather Mango, MD [facility]

## 2022-05-16 NOTE — Transfer of Care (Signed)
Immediate Anesthesia Transfer of Care Note  Patient: Heather Case  Procedure(s) Performed: COLONOSCOPY WITH PROPOFOL POLYPECTOMY  Patient Location: PACU and Endoscopy Unit  Anesthesia Type:MAC  Level of Consciousness: awake, alert  and patient cooperative  Airway & Oxygen Therapy: Patient Spontanous Breathing and Patient connected to face mask oxygen  Post-op Assessment: Report given to RN and Post -op Vital signs reviewed and stable  Post vital signs: Reviewed and stable  Last Vitals:  Vitals Value Taken Time  BP    Temp    Pulse 66 05/16/22 1114  Resp 18 05/16/22 1114  SpO2 100 % 05/16/22 1114  Vitals shown include unvalidated device data.  Last Pain:  Vitals:   05/16/22 0948  TempSrc: Oral  PainSc: 0-No pain         Complications: No notable events documented.

## 2022-05-17 ENCOUNTER — Encounter (HOSPITAL_COMMUNITY): Payer: Self-pay | Admitting: Gastroenterology

## 2022-05-17 LAB — SURGICAL PATHOLOGY

## 2022-05-26 NOTE — Anesthesia Postprocedure Evaluation (Signed)
Anesthesia Post Note  Patient: Heather Case  Procedure(s) Performed: COLONOSCOPY WITH PROPOFOL POLYPECTOMY     Patient location during evaluation: Endoscopy Anesthesia Type: MAC Level of consciousness: awake and alert Pain management: pain level controlled Vital Signs Assessment: post-procedure vital signs reviewed and stable Respiratory status: spontaneous breathing, nonlabored ventilation, respiratory function stable and patient connected to nasal cannula oxygen Cardiovascular status: stable and blood pressure returned to baseline Postop Assessment: no apparent nausea or vomiting Anesthetic complications: no   No notable events documented.  Last Vitals:  Vitals:   05/16/22 1135 05/16/22 1140  BP:  (!) 156/82  Pulse: 66 61  Resp: 11 15  Temp:    SpO2: 100% 100%    Last Pain:  Vitals:   05/16/22 1114  TempSrc: Temporal  PainSc: 0-No pain                 Belenda Cruise P Lawarence Meek

## 2022-11-18 ENCOUNTER — Encounter: Payer: BC Managed Care – PPO | Admitting: Family Medicine

## 2022-12-02 ENCOUNTER — Ambulatory Visit
Admission: RE | Admit: 2022-12-02 | Discharge: 2022-12-02 | Disposition: A | Discharge status: Home / Self Care | Payer: BC Managed Care – PPO | Source: Ambulatory Visit | Attending: Internal Medicine | Admitting: Internal Medicine

## 2022-12-02 DIAGNOSIS — Z1231 Encounter for screening mammogram for malignant neoplasm of breast: Secondary | ICD-10-CM | POA: Insufficient documentation

## 2022-12-02 NOTE — Progress Notes (Signed)
SUBJECTIVE:   Chief Complaint  Patient presents with   Transitions Of Care   HPI Patient presents to clinic for transfer of care.  No acute concerns today.  Hypertension Asymptomatic.  Takes Cozaar 100 mg daily, carvedilol 12.5 mg twice daily and tolerating medication well.  Requesting refill for losartan.  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  Evaluated by cardiology, Dr. Clayborn Bigness, at [facility] in 2021.  Echo 2015 showed LVEF 55%.  Normal left ventricular systolic function with moderate LVH, normal right ventricular systolic function.  Repeat echo in 2017 showed EF 50-55%.  Normal left ventricular systolic function, normal right ventricular systolic function.  Mild LVH.  Mild tricuspid and mitral valve insufficiency at that time.  Remains on carvedilol 12.5 mg twice daily and Lasix 20 mg daily and tolerating medication well.  Has not had follow-up with cardiology since 2021.  Most recent echo in 2019 shows EF 55-65%.  Normal left ventricular systolic function.  Trivial mitral and tricuspid valve regurgitation.  History of COPD Asymptomatic.  Former tobacco use.  Quit 14 years ago.  Currently uses Symbicort 2 puffs twice daily and albuterol as needed.  Has not previously been followed by pulmonology.    PERTINENT PMH / PSH: Hypertension CHF  Postpartum Cardiomyopathy Obesity OBJECTIVE:  BP 130/80   Pulse 86   Temp 98.4 F (36.9 C) (Oral)   Ht '5\' 6"'$  (1.676 m)   Wt 286 lb 9.6 oz (130 kg)   LMP 07/02/2021   SpO2 98%   BMI 46.26 kg/m    Physical Exam Vitals reviewed.  Constitutional:      General: She is not in acute distress.    Appearance: She is not ill-appearing.  HENT:     Head: Normocephalic.     Nose: Nose normal.  Eyes:     Conjunctiva/sclera: Conjunctivae normal.  Cardiovascular:     Rate and Rhythm: Normal rate and regular rhythm.     Heart sounds: Normal heart sounds.  Pulmonary:     Effort: Pulmonary effort is normal.     Breath sounds: Normal  breath sounds.  Abdominal:     General: Abdomen is flat. Bowel sounds are normal.     Palpations: Abdomen is soft.  Musculoskeletal:        General: Normal range of motion.     Cervical back: Normal range of motion.  Neurological:     Mental Status: She is alert and oriented to person, place, and time. Mental status is at baseline.  Psychiatric:        Mood and Affect: Mood normal.        Behavior: Behavior normal.        Thought Content: Thought content normal.        Judgment: Judgment normal.     ASSESSMENT/PLAN:  Primary hypertension Assessment & Plan: Chronic.  Initially elevated, repeat decreased.  Compliant with current medications. Refill losartan 100 mg daily Refill carvedilol 12.5 mg twice daily Labs today. Monitor blood pressure at home, goal less than 130/80 Consider adding Aldactone at next visit. Consider sleep studies at next visit  Orders: -     Comprehensive metabolic panel; Future -     Furosemide; Take 1 tablet (20 mg total) by mouth daily.  Dispense: 30 tablet; Refill: 11  Morbid obesity with BMI of 45.0-49.9, adult [facility]) Assessment & Plan: Chronic.  BMI elevated. Checking labs. Plan for future discussions  Orders: -     Lipid panel; Future  Systolic congestive heart  failure, NYHA class 1, unspecified congestive heart failure chronicity ([place]) Assessment & Plan: Chronic.  Stable.  Secondary to postpartum cardiomyopathy.  Euvolemic on exam. Recent echo shows normal left ventricular systolic function, trivial mitral and tricuspid valve regurgitation.  LVEF 55-65%. Last seen cardiology at [facility] in 2021. Tolerating current medications Refill carvedilol 12.5 mg twice daily Refill Lasix 20 mg daily, could consider as needed given normal EF. Consider SGLT2 in future Monitor blood pressure, goal less than 130/80      B12 deficiency Assessment & Plan: Chronic.  History of low B12.  No malabsorption issues. Checking intrinsic factor History of  vitamin B12 1000 mcg daily sublingual Repeat vitamin B12 in 4 weeks.   Orders: -     Vitamin B12; Future -     Vitamin B-12; Place 1 tablet (1,000 mcg total) under the tongue daily.  Dispense: 90 tablet; Refill: 3 -     Intrinsic Factor Antibodies; Future  Essential hypertension Assessment & Plan: Chronic.  Initially elevated, repeat decreased.  Compliant with current medications. Refill losartan 100 mg daily Refill carvedilol 12.5 mg twice daily Labs today. Monitor blood pressure at home, goal less than 130/80 Consider adding Aldactone at next visit. Consider sleep studies at next visit  Orders: -     Carvedilol; Take 1 tablet (12.5 mg total) by mouth 2 (two) times daily with a meal.  Dispense: 180 tablet; Refill: 3 -     Losartan Potassium; Take 1 tablet (100 mg total) by mouth daily.  Dispense: 90 tablet; Refill: 3  Vitamin D deficiency Assessment & Plan: Chronic.  Taking vitamin D supplements Continue vitamin D 1.25 mg weekly Checking vitamin D levels  Orders: -     VITAMIN D 25 Hydroxy (Vit-D Deficiency, Fractures); Future -     Vitamin D (Ergocalciferol); Take 1 capsule (50,000 Units total) by mouth every 7 (seven) days. D3  Dispense: 12 capsule; Refill: 2  Hx of iron deficiency anemia -     CBC with Differential/Platelet; Future -     Iron; Take 1 tablet (325 mg total) by mouth daily. With vitamin C 250/500  Dispense: 90 tablet; Refill: 3  Prediabetes Assessment & Plan: Chronic. Checking A1c   Orders: -     Hemoglobin A1c; Future  Allergic rhinitis, unspecified seasonality, unspecified trigger -     Montelukast Sodium; TAKE 1 TABLET BY MOUTH EVERYDAY AT BEDTIME  Dispense: 90 tablet; Refill: 3 -     Loratadine; Take 1 tablet (10 mg total) by mouth daily as needed.  Dispense: 90 tablet; Refill: 3  Bronchitis Assessment & Plan: Chronic.  Stable.  Reports history of COPD.  Former tobacco use 14 years ago. Continue Symbicort 2 puffs daily Continue albuterol as  needed. If worsening symptoms consider pulmonology consult.  Orders: -     Budesonide-Formoterol Fumarate; Inhale 2 puffs into the lungs 2 (two) times daily. Rinse mouth out  Dispense: 1 each; Refill: 12  Atypical squamous cell changes of cervix undetermined significance favor benign Assessment & Plan: TAH with bilateral salpingectomy, October 2022.  Indication fibroids No indication for continued pap smears. Follows with OB/GYN.   HCM Mammogram up-to-date.  Due 2/25 Tdap up-to-date Colonoscopy up-to-date.  Due 8/33 No indication for Pap smears given TAH 10/22.  PDMP reviewed  Return in about 2 months (around 02/01/2023) for annual visit with fasting labs 1 week prior.  Carollee Leitz, MD

## 2022-12-02 NOTE — Patient Instructions (Incomplete)
It was a pleasure meeting you today. Thank you for allowing me to take part in your health care.  Our goals for today as we discussed include:  Refill sent on losartan, carvedilol, Lasix  Start vitamin B12 1000 mcg under your tongue daily  Schedule annual appointment in 2 months Schedule fasting blood work 1 week prior to annual visit.   If you have any questions or concerns, please do not hesitate to call the office at [phone].  I look forward to our next visit and until then take care and stay safe.  Regards,   Dana Allan, MD   [facility]

## 2022-12-03 ENCOUNTER — Encounter: Payer: Self-pay | Admitting: Family Medicine

## 2022-12-03 ENCOUNTER — Ambulatory Visit: Payer: BC Managed Care – PPO | Admitting: Family Medicine

## 2022-12-03 VITALS — BP 130/80 | HR 86 | Temp 98.4°F | Ht 66.0 in | Wt 286.6 lb

## 2022-12-03 DIAGNOSIS — J40 Bronchitis, not specified as acute or chronic: Secondary | ICD-10-CM

## 2022-12-03 DIAGNOSIS — J309 Allergic rhinitis, unspecified: Secondary | ICD-10-CM

## 2022-12-03 DIAGNOSIS — I5020 Unspecified systolic (congestive) heart failure: Secondary | ICD-10-CM

## 2022-12-03 DIAGNOSIS — I10 Essential (primary) hypertension: Secondary | ICD-10-CM

## 2022-12-03 DIAGNOSIS — R87610 Atypical squamous cells of undetermined significance on cytologic smear of cervix (ASC-US): Secondary | ICD-10-CM

## 2022-12-03 DIAGNOSIS — E538 Deficiency of other specified B group vitamins: Secondary | ICD-10-CM | POA: Diagnosis not present

## 2022-12-03 DIAGNOSIS — Z6841 Body Mass Index (BMI) 40.0 and over, adult: Secondary | ICD-10-CM

## 2022-12-03 DIAGNOSIS — T8384XA Pain from genitourinary prosthetic devices, implants and grafts, initial encounter: Secondary | ICD-10-CM | POA: Insufficient documentation

## 2022-12-03 DIAGNOSIS — E559 Vitamin D deficiency, unspecified: Secondary | ICD-10-CM

## 2022-12-03 DIAGNOSIS — Z862 Personal history of diseases of the blood and blood-forming organs and certain disorders involving the immune mechanism: Secondary | ICD-10-CM

## 2022-12-03 DIAGNOSIS — R7303 Prediabetes: Secondary | ICD-10-CM

## 2022-12-03 HISTORY — DX: Pain due to genitourinary prosthetic devices, implants and grafts, initial encounter: T83.84XA

## 2022-12-03 MED ORDER — VITAMIN D (ERGOCALCIFEROL) 1.25 MG (50000 UNIT) PO CAPS: 50000.0000 [IU] | ORAL_CAPSULE | ORAL | 2 refills | Status: DC

## 2022-12-03 MED ORDER — LOSARTAN POTASSIUM 100 MG PO TABS: 100.0000 mg | ORAL_TABLET | Freq: Every day | ORAL | 3 refills | Status: DC

## 2022-12-03 MED ORDER — CARVEDILOL 12.5 MG PO TABS: 12.5000 mg | ORAL_TABLET | Freq: Two times a day (BID) | ORAL | 3 refills | Status: DC

## 2022-12-03 MED ORDER — FUROSEMIDE 20 MG PO TABS: 20.0000 mg | ORAL_TABLET | Freq: Every day | ORAL | 11 refills | Status: DC

## 2022-12-03 MED ORDER — IRON 325 (65 FE) MG PO TABS: 1.0000 | ORAL_TABLET | Freq: Every day | ORAL | 3 refills | Status: DC

## 2022-12-03 MED ORDER — LORATADINE 10 MG PO TABS: 10.0000 mg | ORAL_TABLET | Freq: Every day | ORAL | 3 refills | Status: DC | PRN

## 2022-12-03 MED ORDER — MONTELUKAST SODIUM 10 MG PO TABS: ORAL_TABLET | ORAL | 3 refills | Status: DC

## 2022-12-03 MED ORDER — BUDESONIDE-FORMOTEROL FUMARATE 80-4.5 MCG/ACT IN AERO: 2.0000 | INHALATION_SPRAY | Freq: Two times a day (BID) | RESPIRATORY_TRACT | 12 refills | Status: DC

## 2022-12-03 MED ORDER — VITAMIN B-12 1000 MCG SL SUBL: 1000.0000 ug | SUBLINGUAL_TABLET | Freq: Every day | SUBLINGUAL | 3 refills | Status: AC

## 2022-12-06 NOTE — Progress Notes (Incomplete)
SUBJECTIVE:   Chief Complaint  Patient presents with  . Transitions Of Care   HPI Patient presents to clinic for transfer of care.  No acute concerns today.  Hypertension   PERTINENT PMH / PSH: Hypertension CHF  Postpartum Cardiomyopathy Obesity OBJECTIVE:  BP (!) 160/90   Pulse 86   Temp 98.4 F (36.9 C) (Oral)   Ht '5\' 6"'$  (1.676 m)   Wt 286 lb 9.6 oz (130 kg)   LMP 07/02/2021   SpO2 98%   BMI 46.26 kg/m    Physical Exam Vitals reviewed.  Constitutional:      General: She is not in acute distress.    Appearance: She is not ill-appearing.  HENT:     Head: Normocephalic.     Nose: Nose normal.  Eyes:     Conjunctiva/sclera: Conjunctivae normal.  Cardiovascular:     Rate and Rhythm: Normal rate and regular rhythm.     Heart sounds: Normal heart sounds.  Pulmonary:     Effort: Pulmonary effort is normal.     Breath sounds: Normal breath sounds.  Abdominal:     General: Abdomen is flat. Bowel sounds are normal.     Palpations: Abdomen is soft.  Musculoskeletal:        General: Normal range of motion.     Cervical back: Normal range of motion.  Neurological:     Mental Status: She is alert and oriented to person, place, and time. Mental status is at baseline.  Psychiatric:        Mood and Affect: Mood normal.        Behavior: Behavior normal.        Thought Content: Thought content normal.        Judgment: Judgment normal.     ASSESSMENT/PLAN:  Vitamin D deficiency -     VITAMIN D 25 Hydroxy (Vit-D Deficiency, Fractures); Future -     Vitamin D (Ergocalciferol); Take 1 capsule (50,000 Units total) by mouth every 7 (seven) days. D3  Dispense: 12 capsule; Refill: 2  B12 deficiency -     Vitamin B12; Future -     Vitamin B-12; Place 1 tablet (1,000 mcg total) under the tongue daily.  Dispense: 90 tablet; Refill: 3  Essential hypertension -     Carvedilol; Take 1 tablet (12.5 mg total) by mouth 2 (two) times daily with a meal.  Dispense: 180 tablet;  Refill: 3 -     Losartan Potassium; Take 1 tablet (100 mg total) by mouth daily.  Dispense: 90 tablet; Refill: 3  Hx of iron deficiency anemia -     CBC with Differential/Platelet; Future -     Iron; Take 1 tablet (325 mg total) by mouth daily. With vitamin C 250/500  Dispense: 90 tablet; Refill: 3  Prediabetes -     Hemoglobin A1c; Future  Primary hypertension -     Comprehensive metabolic panel; Future -     Furosemide; Take 1 tablet (20 mg total) by mouth daily.  Dispense: 30 tablet; Refill: 11  Morbid obesity with BMI of 45.0-49.9, adult ([place]) -     Lipid panel; Future  Allergic rhinitis, unspecified seasonality, unspecified trigger -     Montelukast Sodium; TAKE 1 TABLET BY MOUTH EVERYDAY AT BEDTIME  Dispense: 90 tablet; Refill: 3 -     Loratadine; Take 1 tablet (10 mg total) by mouth daily as needed.  Dispense: 90 tablet; Refill: 3  Bronchitis -     Budesonide-Formoterol Fumarate; Inhale 2  puffs into the lungs 2 (two) times daily. Rinse mouth out  Dispense: 1 each; Refill: 12   PDMP reviewed***  Return in about 2 months (around 02/01/2023) for annual visit with fasting labs 1 week prior.  Carollee Leitz, MD

## 2022-12-07 ENCOUNTER — Encounter: Payer: Self-pay | Admitting: Family Medicine

## 2022-12-07 DIAGNOSIS — E538 Deficiency of other specified B group vitamins: Secondary | ICD-10-CM | POA: Insufficient documentation

## 2022-12-07 NOTE — Assessment & Plan Note (Signed)
Chronic.  History of low B12.  No malabsorption issues. Checking intrinsic factor History of vitamin B12 1000 mcg daily sublingual Repeat vitamin B12 in 4 weeks.

## 2022-12-07 NOTE — Assessment & Plan Note (Signed)
Chronic.  BMI elevated. Checking labs. Plan for future discussions

## 2022-12-07 NOTE — Assessment & Plan Note (Addendum)
Chronic.  Stable.  Secondary to postpartum cardiomyopathy.  Euvolemic on exam. Recent echo shows normal left ventricular systolic function, trivial mitral and tricuspid valve regurgitation.  LVEF 55-65%. Last seen cardiology at [facility] in 2021. Tolerating current medications Refill carvedilol 12.5 mg twice daily Refill Lasix 20 mg daily, could consider as needed given normal EF. Consider SGLT2 in future Monitor blood pressure, goal less than 130/80

## 2022-12-07 NOTE — Assessment & Plan Note (Addendum)
TAH with bilateral salpingectomy, October 2022.  Indication fibroids No indication for continued pap smears. Follows with OB/GYN.

## 2022-12-07 NOTE — Assessment & Plan Note (Signed)
Chronic.  Taking vitamin D supplements Continue vitamin D 1.25 mg weekly Checking vitamin D levels

## 2022-12-07 NOTE — Assessment & Plan Note (Signed)
Chronic.  Stable.  Reports history of COPD.  Former tobacco use 14 years ago. Continue Symbicort 2 puffs daily Continue albuterol as needed. If worsening symptoms consider pulmonology consult.

## 2022-12-07 NOTE — Assessment & Plan Note (Addendum)
Chronic.  Initially elevated, repeat decreased.  Compliant with current medications. Refill losartan 100 mg daily Refill carvedilol 12.5 mg twice daily Labs today. Monitor blood pressure at home, goal less than 130/80 Consider adding Aldactone at next visit. Consider sleep studies at next visit

## 2022-12-07 NOTE — Assessment & Plan Note (Signed)
Chronic. Checking A1c

## 2023-01-28 ENCOUNTER — Other Ambulatory Visit: Payer: BC Managed Care – PPO

## 2023-02-03 ENCOUNTER — Encounter: Payer: BC Managed Care – PPO | Admitting: Family Medicine

## 2023-09-09 ENCOUNTER — Other Ambulatory Visit (INDEPENDENT_AMBULATORY_CARE_PROVIDER_SITE_OTHER): Payer: BC Managed Care – PPO

## 2023-09-09 DIAGNOSIS — E538 Deficiency of other specified B group vitamins: Secondary | ICD-10-CM

## 2023-09-09 DIAGNOSIS — E559 Vitamin D deficiency, unspecified: Secondary | ICD-10-CM

## 2023-09-09 DIAGNOSIS — Z862 Personal history of diseases of the blood and blood-forming organs and certain disorders involving the immune mechanism: Secondary | ICD-10-CM

## 2023-09-09 DIAGNOSIS — I10 Essential (primary) hypertension: Secondary | ICD-10-CM

## 2023-09-09 DIAGNOSIS — R7303 Prediabetes: Secondary | ICD-10-CM | POA: Diagnosis not present

## 2023-09-09 DIAGNOSIS — Z6841 Body Mass Index (BMI) 40.0 and over, adult: Secondary | ICD-10-CM | POA: Diagnosis not present

## 2023-09-09 LAB — CBC WITH DIFFERENTIAL/PLATELET
Basophils Absolute: 0.1 K/uL (ref 0.0–0.1)
Basophils Relative: 1.1 % (ref 0.0–3.0)
Eosinophils Absolute: 0.3 K/uL (ref 0.0–0.7)
Eosinophils Relative: 3.9 % (ref 0.0–5.0)
HCT: 39.9 % (ref 36.0–46.0)
Hemoglobin: 13 g/dL (ref 12.0–15.0)
Lymphocytes Relative: 32.4 % (ref 12.0–46.0)
Lymphs Abs: 2.1 K/uL (ref 0.7–4.0)
MCHC: 32.7 g/dL (ref 30.0–36.0)
MCV: 97.1 fl (ref 78.0–100.0)
Monocytes Absolute: 0.5 K/uL (ref 0.1–1.0)
Monocytes Relative: 7.2 % (ref 3.0–12.0)
Neutro Abs: 3.6 K/uL (ref 1.4–7.7)
Neutrophils Relative %: 55.4 % (ref 43.0–77.0)
Platelets: 238 K/uL (ref 150.0–400.0)
RBC: 4.11 Mil/uL (ref 3.87–5.11)
RDW: 14.5 % (ref 11.5–15.5)
WBC: 6.5 K/uL (ref 4.0–10.5)

## 2023-09-09 LAB — COMPREHENSIVE METABOLIC PANEL
ALT: 18 U/L (ref 0–35)
AST: 17 U/L (ref 0–37)
Albumin: 4.3 g/dL (ref 3.5–5.2)
Alkaline Phosphatase: 69 U/L (ref 39–117)
BUN: 10 mg/dL (ref 6–23)
CO2: 29 meq/L (ref 19–32)
Calcium: 9.5 mg/dL (ref 8.4–10.5)
Chloride: 102 meq/L (ref 96–112)
Creatinine, Ser: 0.89 mg/dL (ref 0.40–1.20)
GFR: 75.77 mL/min (ref >60.00)
Glucose, Bld: 106 mg/dL — ABNORMAL HIGH (ref 70–99)
Potassium: 4 meq/L (ref 3.5–5.1)
Sodium: 137 meq/L (ref 135–145)
Total Bilirubin: 1 mg/dL (ref 0.2–1.2)
Total Protein: 7.2 g/dL (ref 6.0–8.3)

## 2023-09-09 LAB — LIPID PANEL
Cholesterol: 186 mg/dL (ref 0–200)
HDL: 64 mg/dL
LDL Cholesterol: 109 mg/dL — ABNORMAL HIGH (ref 0–99)
NonHDL: 122.26
Total CHOL/HDL Ratio: 3
Triglycerides: 66 mg/dL (ref 0.0–149.0)
VLDL: 13.2 mg/dL (ref 0.0–40.0)

## 2023-09-09 LAB — VITAMIN D 25 HYDROXY (VIT D DEFICIENCY, FRACTURES): VITD: 26.27 ng/mL — ABNORMAL LOW (ref 30.00–100.00)

## 2023-09-09 LAB — VITAMIN B12: Vitamin B-12: 586 pg/mL (ref 211–911)

## 2023-09-09 LAB — HEMOGLOBIN A1C: Hgb A1c MFr Bld: 6.4 % (ref 4.6–6.5)

## 2023-09-12 LAB — INTRINSIC FACTOR ANTIBODIES: Intrinsic Factor: NEGATIVE

## 2023-09-14 ENCOUNTER — Other Ambulatory Visit: Payer: Self-pay | Admitting: Family Medicine

## 2023-09-14 ENCOUNTER — Encounter: Payer: Self-pay | Admitting: Family Medicine

## 2023-09-14 DIAGNOSIS — E559 Vitamin D deficiency, unspecified: Secondary | ICD-10-CM

## 2023-09-14 MED ORDER — VITAMIN D (ERGOCALCIFEROL) 1.25 MG (50000 UNIT) PO CAPS: 50000.0000 [IU] | ORAL_CAPSULE | ORAL | 1 refills | Status: DC

## 2023-09-15 ENCOUNTER — Other Ambulatory Visit: Payer: Self-pay | Admitting: Family Medicine

## 2023-09-15 DIAGNOSIS — E538 Deficiency of other specified B group vitamins: Secondary | ICD-10-CM

## 2023-10-28 ENCOUNTER — Other Ambulatory Visit: Payer: Self-pay | Admitting: Family Medicine

## 2023-10-28 DIAGNOSIS — Z1231 Encounter for screening mammogram for malignant neoplasm of breast: Secondary | ICD-10-CM

## 2023-12-08 ENCOUNTER — Other Ambulatory Visit: Payer: Self-pay | Admitting: Family Medicine

## 2023-12-08 ENCOUNTER — Ambulatory Visit
Admission: RE | Admit: 2023-12-08 | Discharge: 2023-12-08 | Disposition: A | Discharge status: Home / Self Care | Payer: 59 | Source: Ambulatory Visit | Attending: Family Medicine | Admitting: Family Medicine

## 2023-12-08 DIAGNOSIS — I10 Essential (primary) hypertension: Secondary | ICD-10-CM

## 2023-12-08 DIAGNOSIS — Z1231 Encounter for screening mammogram for malignant neoplasm of breast: Secondary | ICD-10-CM | POA: Diagnosis present

## 2023-12-09 NOTE — Telephone Encounter (Signed)
 Attempted to call Patient and schedule an appointment since she has not been seen since 12/03/22- no answer and voicemail is full

## 2023-12-10 ENCOUNTER — Encounter: Payer: Self-pay | Admitting: Nurse Practitioner

## 2023-12-10 ENCOUNTER — Ambulatory Visit: Payer: 59 | Admitting: Nurse Practitioner

## 2023-12-10 VITALS — BP 150/78 | HR 85 | Temp 98.1°F | Ht 66.0 in | Wt 308.6 lb

## 2023-12-10 DIAGNOSIS — E7800 Pure hypercholesterolemia, unspecified: Secondary | ICD-10-CM | POA: Insufficient documentation

## 2023-12-10 DIAGNOSIS — I10 Essential (primary) hypertension: Secondary | ICD-10-CM | POA: Diagnosis not present

## 2023-12-10 DIAGNOSIS — J42 Unspecified chronic bronchitis: Secondary | ICD-10-CM | POA: Insufficient documentation

## 2023-12-10 DIAGNOSIS — Z23 Encounter for immunization: Secondary | ICD-10-CM

## 2023-12-10 DIAGNOSIS — R7303 Prediabetes: Secondary | ICD-10-CM

## 2023-12-10 DIAGNOSIS — E538 Deficiency of other specified B group vitamins: Secondary | ICD-10-CM

## 2023-12-10 DIAGNOSIS — J309 Allergic rhinitis, unspecified: Secondary | ICD-10-CM

## 2023-12-10 DIAGNOSIS — I5020 Unspecified systolic (congestive) heart failure: Secondary | ICD-10-CM

## 2023-12-10 MED ORDER — FLUTICASONE PROPIONATE 50 MCG/ACT NA SUSP: 2.0000 | Freq: Every day | NASAL | 11 refills | Status: AC

## 2023-12-10 MED ORDER — FUROSEMIDE 20 MG PO TABS: 20.0000 mg | ORAL_TABLET | Freq: Every day | ORAL | 5 refills | Status: DC | PRN

## 2023-12-10 MED ORDER — BUDESONIDE-FORMOTEROL FUMARATE 80-4.5 MCG/ACT IN AERO: 2.0000 | INHALATION_SPRAY | Freq: Two times a day (BID) | RESPIRATORY_TRACT | 12 refills | Status: DC

## 2023-12-10 MED ORDER — CARVEDILOL 12.5 MG PO TABS: 12.5000 mg | ORAL_TABLET | Freq: Two times a day (BID) | ORAL | 1 refills | Status: DC

## 2023-12-10 MED ORDER — MONTELUKAST SODIUM 10 MG PO TABS: ORAL_TABLET | ORAL | 3 refills | Status: DC

## 2023-12-10 MED ORDER — LORATADINE 10 MG PO TABS: 10.0000 mg | ORAL_TABLET | Freq: Every day | ORAL | 3 refills | Status: DC | PRN

## 2023-12-10 MED ORDER — LOSARTAN POTASSIUM 100 MG PO TABS: 100.0000 mg | ORAL_TABLET | Freq: Every day | ORAL | 1 refills | Status: DC

## 2023-12-10 NOTE — Progress Notes (Signed)
 Heather Dicker, NP-C Phone: [phone]  Heather Case is a 53 y.o. female who presents today for medication refill.   Discussed the use of AI scribe software for clinical note transcription with the patient, who gave verbal consent to proceed.  History of Present Illness        Heather Case is a 53 year old female with hypertension and heart failure who presents for a follow-up visit.  She has a history of hypertension and heart failure, currently managed with losartan 100 mg daily and carvedilol 12.5 mg twice a day. She ran out of carvedilol two days ago. She does not regularly monitor her blood pressure at home. No chest pain, shortness of breath, dizziness, or swelling. She uses Lasix as needed for swelling but experiences cramping if taken daily. Her ejection fraction has been stable, and she has not consulted cardiology since 2021.  She is prediabetic with a stable A1c. She acknowledges the need for weight loss and increased physical activity. She describes herself as 'probably the laziest person I know right now' and has struggled to resume her previous exercise routine following surgery, including a hysterectomy and IUD removal.  She uses Symbicort twice daily and albuterol as needed, approximately every couple of days. No shortness of breath, coughing, or wheezing. She does not smoke.  Recent labs indicated low vitamin D levels, for which she is taking a supplement. Her LDL cholesterol was slightly elevated, and she is working on a healthy diet and exercise to manage it. B12 levels have improved with monthly injections. She does not take an iron supplement, and her blood counts are normal. She recently had a mammogram and received a flu shot during this visit.  Social History   Tobacco Use  Smoking Status Former   Current packs/day: 0.00   Types: Cigarettes   Quit date: 11/13/2008   Years since quitting: 15.0  Smokeless Tobacco Never  Tobacco Comments   quit in 2010. Used to  smoke 1/2 PPD.    Current Outpatient Medications on File Prior to Visit  Medication Sig Dispense Refill   albuterol (VENTOLIN HFA) 108 (90 Base) MCG/ACT inhaler Inhale 1-2 puffs into the lungs every 6 (six) hours as needed for wheezing or shortness of breath. 18 g 12   Cyanocobalamin (VITAMIN B-12) 1000 MCG SUBL Place 1 tablet (1,000 mcg total) under the tongue daily. 90 tablet 3   Ferrous Sulfate (IRON) 325 (65 Fe) MG TABS Take 1 tablet (325 mg total) by mouth daily. With vitamin C 250/500 90 tablet 3   Multiple Vitamin (MULTIVITAMIN WITH MINERALS) TABS tablet Take 1 tablet by mouth daily.     Polyethylene Glycol 400 (BLINK TEARS) 0.25 % SOLN Place 1 drop into both eyes daily as needed (dry eyes).     Probiotic Product (PROBIOTIC-10) CAPS Take 1 capsule by mouth daily.      Vitamin D, Ergocalciferol, (DRISDOL) 1.25 MG (50000 UNIT) CAPS capsule Take 1 capsule (50,000 Units total) by mouth every 7 (seven) days. 12 capsule 1   No current facility-administered medications on file prior to visit.    ROS see history of present illness  Objective  Physical Exam Vitals:   12/10/23 0833 12/10/23 0858  BP: (!) 150/80 (!) 150/78  Pulse: 85   Temp: 98.1 F (36.7 C)   SpO2: 97%     BP Readings from Last 3 Encounters:  12/10/23 (!) 150/78  12/03/22 130/80  05/16/22 (!) 156/82   Wt Readings from Last 3 Encounters:  12/10/23 (!) 308 lb 9.6 oz (140 kg)  12/03/22 286 lb 9.6 oz (130 kg)  05/16/22 279 lb 1.6 oz (126.6 kg)    Physical Exam Constitutional:      General: She is not in acute distress.    Appearance: Normal appearance. She is obese.  HENT:     Head: Normocephalic.  Cardiovascular:     Rate and Rhythm: Normal rate and regular rhythm.     Heart sounds: Normal heart sounds.  Pulmonary:     Effort: Pulmonary effort is normal.     Breath sounds: Normal breath sounds.  Skin:    General: Skin is warm and dry.  Neurological:     General: No focal deficit present.     Mental  Status: She is alert.  Psychiatric:        Mood and Affect: Mood normal.        Behavior: Behavior normal.    Assessment/Plan: Please see individual problem list.  Primary hypertension Assessment & Plan: Blood pressure is elevated today x 2 today, she has not taken her Carvedilol in 2 days. Resume Carvedilol 12.5mg  twice daily and refill the prescription. She will continue Losartan 100 mg daily. Advised to start monitoring blood pressure at home, goal less than 130/80. Discussed health risks of uncontrolled hypertension. Advised healthy diet, decreased sodium intake and weight loss. She will follow up with her PCP.   Orders: -     Carvedilol; Take 1 tablet (12.5 mg total) by mouth 2 (two) times daily with a meal.  Dispense: 180 tablet; Refill: 1 -     Losartan Potassium; Take 1 tablet (100 mg total) by mouth daily.  Dispense: 90 tablet; Refill: 1  Elevated LDL cholesterol level Assessment & Plan: LDL cholesterol is elevated at 109. Emphasized the importance of diet and exercise in managing cholesterol levels. Encourage a healthy diet and regular exercise. The 10-year ASCVD risk score (Arnett DK, et al., 2019) is: 5%.   Chronic bronchitis, unspecified chronic bronchitis type (HCC) Assessment & Plan: Symbicort is used daily with occasional use of Albuterol. No shortness of breath or coughing. Continue Symbicort twice daily and use Albuterol as needed.  Orders: -     Budesonide-Formoterol Fumarate; Inhale 2 puffs into the lungs 2 (two) times daily. Rinse mouth out  Dispense: 1 each; Refill: 12  Systolic congestive heart failure, NYHA class 1, unspecified congestive heart failure chronicity (HCC) Assessment & Plan: Ejection fraction is stable with no current symptoms of fluid overload. Lasix use has been inconsistent due to concerns about cramping. Continue Losartan 100mg  daily and use Lasix as needed for swelling.  Orders: -     Furosemide; Take 1 tablet (20 mg total) by mouth daily as  needed for edema.  Dispense: 30 tablet; Refill: 5  Prediabetes Assessment & Plan: A1C is stable at 6.4, currently managed with lifestyle modifications. Encourage weight loss, healthy diet, and regular exercise to improve blood glucose control.   B12 deficiency Assessment & Plan: B12 levels have improved with monthly injections. Continue monthly B12 injections.   Allergic rhinitis, unspecified seasonality, unspecified trigger -     Fluticasone Propionate; Place 2 sprays into both nostrils daily.  Dispense: 16 g; Refill: 11 -     Loratadine; Take 1 tablet (10 mg total) by mouth daily as needed.  Dispense: 90 tablet; Refill: 3 -     Montelukast Sodium; TAKE 1 TABLET BY MOUTH EVERYDAY AT BEDTIME  Dispense: 90 tablet; Refill: 3  Need for influenza vaccination -  Flu vaccine trivalent PF, 6mos and older(Flulaval,Afluria,Fluarix,Fluzone)    Return for Follow up with PCP.   Heather Dicker, NP-C [facility]

## 2023-12-10 NOTE — Assessment & Plan Note (Signed)
 LDL cholesterol is elevated at 109. Emphasized the importance of diet and exercise in managing cholesterol levels. Encourage a healthy diet and regular exercise. The 10-year ASCVD risk score (Arnett DK, et al., 2019) is: 5%.

## 2023-12-10 NOTE — Assessment & Plan Note (Signed)
 B12 levels have improved with monthly injections. Continue monthly B12 injections.

## 2023-12-10 NOTE — Assessment & Plan Note (Signed)
 Blood pressure is elevated today x 2 today, she has not taken her Carvedilol in 2 days. Resume Carvedilol 12.5mg  twice daily and refill the prescription. She will continue Losartan 100 mg daily. Advised to start monitoring blood pressure at home, goal less than 130/80. Discussed health risks of uncontrolled hypertension. Advised healthy diet, decreased sodium intake and weight loss. She will follow up with her PCP.

## 2023-12-10 NOTE — Assessment & Plan Note (Signed)
 Ejection fraction is stable with no current symptoms of fluid overload. Lasix use has been inconsistent due to concerns about cramping. Continue Losartan 100mg  daily and use Lasix as needed for swelling.

## 2023-12-10 NOTE — Assessment & Plan Note (Signed)
 Symbicort is used daily with occasional use of Albuterol. No shortness of breath or coughing. Continue Symbicort twice daily and use Albuterol as needed.

## 2023-12-10 NOTE — Assessment & Plan Note (Signed)
 A1C is stable at 6.4, currently managed with lifestyle modifications. Encourage weight loss, healthy diet, and regular exercise to improve blood glucose control.

## 2024-06-08 ENCOUNTER — Ambulatory Visit: Payer: 59 | Admitting: Family Medicine

## 2024-06-09 ENCOUNTER — Telehealth: Payer: Self-pay

## 2024-06-09 ENCOUNTER — Ambulatory Visit

## 2024-06-09 VITALS — BP 130/76 | HR 73 | Temp 98.8°F | Ht 66.0 in | Wt 314.2 lb

## 2024-06-09 DIAGNOSIS — E559 Vitamin D deficiency, unspecified: Secondary | ICD-10-CM

## 2024-06-09 DIAGNOSIS — R062 Wheezing: Secondary | ICD-10-CM | POA: Insufficient documentation

## 2024-06-09 DIAGNOSIS — J309 Allergic rhinitis, unspecified: Secondary | ICD-10-CM

## 2024-06-09 DIAGNOSIS — I10 Essential (primary) hypertension: Secondary | ICD-10-CM | POA: Diagnosis not present

## 2024-06-09 DIAGNOSIS — R0683 Snoring: Secondary | ICD-10-CM | POA: Insufficient documentation

## 2024-06-09 DIAGNOSIS — Z862 Personal history of diseases of the blood and blood-forming organs and certain disorders involving the immune mechanism: Secondary | ICD-10-CM

## 2024-06-09 DIAGNOSIS — R7303 Prediabetes: Secondary | ICD-10-CM

## 2024-06-09 DIAGNOSIS — N951 Menopausal and female climacteric states: Secondary | ICD-10-CM | POA: Insufficient documentation

## 2024-06-09 DIAGNOSIS — E538 Deficiency of other specified B group vitamins: Secondary | ICD-10-CM

## 2024-06-09 DIAGNOSIS — J42 Unspecified chronic bronchitis: Secondary | ICD-10-CM

## 2024-06-09 DIAGNOSIS — Z6841 Body Mass Index (BMI) 40.0 and over, adult: Secondary | ICD-10-CM

## 2024-06-09 DIAGNOSIS — I5020 Unspecified systolic (congestive) heart failure: Secondary | ICD-10-CM | POA: Diagnosis not present

## 2024-06-09 MED ORDER — TELMISARTAN 40 MG PO TABS
40.0000 mg | ORAL_TABLET | Freq: Every day | ORAL | 3 refills | Status: DC
Start: 2024-06-09 — End: 2024-09-08

## 2024-06-09 MED ORDER — BUDESONIDE-FORMOTEROL FUMARATE 80-4.5 MCG/ACT IN AERO: 2.0000 | INHALATION_SPRAY | Freq: Two times a day (BID) | RESPIRATORY_TRACT | 12 refills | Status: AC

## 2024-06-09 MED ORDER — LORATADINE 10 MG PO TABS: 10.0000 mg | ORAL_TABLET | Freq: Every day | ORAL | 3 refills | Status: AC | PRN

## 2024-06-09 MED ORDER — TIRZEPATIDE-WEIGHT MANAGEMENT 2.5 MG/0.5ML ~~LOC~~ SOLN: 2.5000 mg | SUBCUTANEOUS | 0 refills | Status: DC

## 2024-06-09 MED ORDER — FUROSEMIDE 20 MG PO TABS: 20.0000 mg | ORAL_TABLET | Freq: Every day | ORAL | 5 refills | Status: DC | PRN

## 2024-06-09 MED ORDER — MONTELUKAST SODIUM 10 MG PO TABS: ORAL_TABLET | ORAL | 3 refills | Status: AC

## 2024-06-09 MED ORDER — ALBUTEROL SULFATE HFA 108 (90 BASE) MCG/ACT IN AERS: 1.0000 | INHALATION_SPRAY | Freq: Four times a day (QID) | RESPIRATORY_TRACT | 12 refills | Status: AC | PRN

## 2024-06-09 MED ORDER — CARVEDILOL 12.5 MG PO TABS: 12.5000 mg | ORAL_TABLET | Freq: Two times a day (BID) | ORAL | 3 refills | Status: AC

## 2024-06-09 NOTE — Assessment & Plan Note (Signed)
 Check A1c.  Lifestyle modifications including dietary, exercise discussed today.

## 2024-06-09 NOTE — Assessment & Plan Note (Signed)
 Check vitamin D  level

## 2024-06-09 NOTE — Assessment & Plan Note (Signed)
 Patient is asymptomatic today.  Has bilateral lower leg 1+ pitting edema.  Discussed regular exercise, as needed Lasix  20 mg for worsening edema, shortness of breath.  Following up with [facility] cardiology once a year. Continue carvedilol  12.5 mg twice daily, Lasix  20 mg as needed.

## 2024-06-09 NOTE — Assessment & Plan Note (Signed)
 Symptoms stable on montelukast  10 mg nightly, Symbicort  twice daily, albuterol  inhaler as needed for rescue.  Refill sent.

## 2024-06-09 NOTE — Assessment & Plan Note (Addendum)
 Morbid obesity with complication including hypertension, suspicion for sleep apnea, hyperlipidemia.  Patient has been working on lifestyle modification including eating healthy, trying to incorporate exercise into her daily routine.  Recommend moderate intensity exercise for 30 minutes for 5 days a week or at least 150 minutes total with incorporating healthy food.  Discussed pharmacological intervention to help assist in weight loss.  No history of medullary thyroid cancer.  No history of pancreatitis in patient. Discussed starting Zepbound  2.5 mg once weekly injection. Potential side effects, pancreatitis risk, discussed. Addressed insurance and cost. - Initiate prior authorization for Zepbound  2.5 mg once weekly subcutaneous injection.  - Schedule follow-up in 2-3 months to assess medication tolerance and effectiveness. If unable to start Zepbound , recommend follow-up in 6 months. Will also check vitamin D  level today.

## 2024-06-09 NOTE — Assessment & Plan Note (Signed)
 Experiencing hot flashes, night sweats, and skin changes.  Briefly discussed risk and benefit of hormonal therapy, OB referral.   Hormonal therapy not recommended due to family history of breast cancer and cardiovascular risks and patient. Non-hormonal options discussed.  Consider venlafaxine to help with vasomotor symptoms. Monitor symptoms and consider non-hormonal medication if symptoms worsen.

## 2024-06-09 NOTE — Assessment & Plan Note (Signed)
 Loud snoring, currently on antihypertensive medication, obesity.  High suspicion for OSA given patient's symptoms.  Recommend evaluation by sleep specialist, referral made.

## 2024-06-09 NOTE — Assessment & Plan Note (Signed)
-   Did not tolerate Flonase .  Symptoms has been otherwise stable on loratadine  10 mg daily.  Continue.  Refill sent.

## 2024-06-09 NOTE — Progress Notes (Signed)
 Established Patient Office Visit TOC from Dr. Hope    Subjective  Patient ID: Heather Case, female    DOB: Mar 28, 1971  Age: 53 y.o. 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  HPI Discussed the use of AI scribe software for clinical note transcription with the patient, who gave verbal consent to proceed.  History of Present Illness Heather Case is a 53 year old female who presents for transfer of care.    - She has been experiencing hot flashes, night sweats, insomnia, and skin breakouts over the past couple of months. She had a hysterectomy in the past, but her ovaries were left intact. She has not seen an OB GYN in the past year.  Patient has family history significant for breast cancer in her mom and paternal grandmothers.   - She has a history of asthma, managed with albuterol  as needed and Symbicort  twice daily. She quit smoking some time ago.   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 She takes carvedilol  12.5 mg twice daily and furosemide  20 mg as needed.  Medication refilled through PCP.  - She has prediabetes and manages it by monitoring her  sugar and carbohydrate intake and walking occasionally. She works at [employer] in a sedentary role.  Patient is interested in pharmacological intervention for weight loss.  She denies family history significant for medullary thyroid cancer, history of thyroid disorder, pancreatitis in patient.  - She takes vitamin D3 and B12 supplements over the counter.  Previous lab with history of vitamin D , vitamin B12 deficiency.  She previously took iron  supplements due to low levels around the time of her hysterectomy but stopped due to gastrointestinal side effects.  CBC from 08/2023 showed normal hemoglobin, MCV.  - She experiences seasonal allergies and uses loratadine  daily, which she finds effective for itching. She has tried Flonase  but dislikes it due to the taste.   - She reports occasional leg swelling and tries to walk around every two to three hours to manage it. She has not been evaluated for sleep apnea but acknowledges snoring, especially with weight gain.  - She is currently taking losartan  for blood pressure management.  She ran out of losartan  couple days ago.  She does not regularly monitor her blood pressure at home.  She denies chest pain, palpitations.   ROS As per HPI    Objective:     BP 130/76 (BP Location: Right Arm, Patient Position: Sitting, Cuff Size: Normal)   Pulse 73   Temp 98.8 F (37.1 C) (Oral)   Ht 5' 6 (1.676 m)   Wt (!) 314 lb 3.2 oz (142.5 kg)   LMP 07/02/2021   SpO2 95%   BMI 50.71 kg/m      06/09/2024    3:09 PM 12/03/2022  1:17 PM 05/15/2022    9:25 AM  Depression screen PHQ 2/9  Decreased Interest 0 0 0  Down, Depressed, Hopeless 0 0 0  PHQ - 2 Score 0 0 0  Altered sleeping 1    Tired, decreased energy 0    Change in appetite 0    Feeling bad or failure about yourself  0    Trouble concentrating 0    Moving slowly or fidgety/restless 0    Suicidal thoughts 0    PHQ-9 Score 1    Difficult doing work/chores Not difficult at all         06/09/2024    3:09 PM 12/03/2022    1:17 PM  GAD 7 : Generalized Anxiety Score  Nervous, Anxious, on Edge 0 0  Control/stop worrying 0 0  Worry too much - different things 0 0  Trouble relaxing 0 0  Restless 0 0  Easily annoyed or irritable 0 0  Afraid - awful might happen 0 0  Total GAD 7 Score 0 0  Anxiety Difficulty Not difficult at all Not difficult at all      06/09/2024    3:09 PM 12/03/2022    1:17 PM 05/15/2022    9:25 AM  Depression screen PHQ 2/9  Decreased Interest 0 0 0  Down, Depressed, Hopeless 0 0 0  PHQ - 2 Score 0 0 0  Altered sleeping 1    Tired, decreased energy 0    Change in appetite 0    Feeling bad or failure about yourself  0    Trouble concentrating 0    Moving slowly or fidgety/restless 0    Suicidal thoughts 0    PHQ-9 Score 1    Difficult doing work/chores Not difficult at all        06/09/2024    3:09 PM 12/03/2022    1:17 PM  GAD 7 : Generalized Anxiety Score  Nervous, Anxious, on Edge 0 0  Control/stop worrying 0 0  Worry too much - different things 0 0  Trouble relaxing 0 0  Restless 0 0  Easily annoyed or irritable 0 0  Afraid - awful might happen 0 0  Total GAD 7 Score 0 0  Anxiety Difficulty Not difficult at all Not difficult at all   SDOH Screenings   Depression (PHQ2-9): Low Risk  (06/09/2024)  Tobacco Use: Medium Risk (06/09/2024)     Physical Exam Constitutional:      Appearance: She is obese. She is not toxic-appearing.  HENT:     Head: Normocephalic and atraumatic.     Left Ear: Tympanic membrane normal.     Mouth/Throat:     Mouth: Mucous membranes are moist.     Pharynx: No oropharyngeal exudate.  Cardiovascular:     Rate and Rhythm: Normal rate.  Pulmonary:     Effort: Pulmonary effort is normal.     Breath sounds: Normal breath sounds. No wheezing.  Abdominal:     General: Abdomen is protuberant. Bowel sounds are normal.     Palpations: Abdomen is soft.     Tenderness: There is no abdominal tenderness.   Musculoskeletal:     Cervical back: Neck supple. No tenderness.     Right lower leg: Edema (1+pitting) present.     Left lower leg: Edema (1+pitting) present.  Skin:    General: Skin is warm.  Neurological:     Mental Status: She is oriented to person, place, and time.     Gait: Gait  normal.  Psychiatric:        Mood and Affect: Mood normal.        No results found for any visits on 06/09/24.  The 10-year ASCVD risk score (Arnett DK, et al., 2019) is: 2.8%     Assessment & Plan:  Patient is a pleasant 53 year old female presenting for transfer of care.  She qualifies for immunization including flu, shingles, and pneumonia vaccines. Schedule flu shot starting September 2nd. Discuss updating shingles and pneumonia vaccines through local pharmacy.  Primary hypertension Assessment & Plan: Currently on losartan .  Discussed discontinuing losartan  and starting telmisartan  for longer half-life of telmisartan .  Patient agreeable.  To start telmisartan  40 mg daily.  Check blood pressure twice a week at home.    Orders: -     Carvedilol ; Take 1 tablet (12.5 mg total) by mouth 2 (two) times daily with a meal.  Dispense: 180 tablet; Refill: 3 -     Comprehensive metabolic panel with GFR; Future -     Telmisartan ; Take 1 tablet (40 mg total) by mouth at bedtime.  Dispense: 90 tablet; Refill: 3 -     TSH; Future  Systolic congestive heart failure, NYHA class 1, unspecified congestive heart failure chronicity (HCC) Assessment & Plan: Patient is asymptomatic today.  Has bilateral lower leg 1+ pitting edema.  Discussed regular exercise, as needed Lasix  20 mg for worsening edema, shortness of breath.  Following up with [facility]ology once a year. Continue carvedilol  12.5 mg twice daily, Lasix  20 mg as needed.  Orders: -     Carvedilol ; Take 1 tablet (12.5 mg total) by mouth 2 (two) times daily with a meal.  Dispense: 180 tablet; Refill: 3 -     Furosemide ; Take 1 tablet (20 mg total) by  mouth daily as needed for edema.  Dispense: 30 tablet; Refill: 5  Allergic rhinitis, unspecified seasonality, unspecified trigger Assessment & Plan: - Did not tolerate Flonase .  Symptoms has been otherwise stable on loratadine  10 mg daily.  Continue.  Refill sent.  Orders: -     Loratadine ; Take 1 tablet (10 mg total) by mouth daily as needed.  Dispense: 90 tablet; Refill: 3  Prediabetes Assessment & Plan: Check A1c.  Lifestyle modifications including dietary, exercise discussed today.  Orders: -     Hemoglobin A1c; Future  Hx of iron  deficiency anemia  Morbid obesity with BMI of 45.0-49.9, adult [facility]) Assessment & Plan: Morbid obesity with complication including hypertension, suspicion for sleep apnea, hyperlipidemia.  Patient has been working on lifestyle modification including eating healthy, trying to incorporate exercise into her daily routine.  Recommend moderate intensity exercise for 30 minutes for 5 days a week or at least 150 minutes total with incorporating healthy food.  Discussed pharmacological intervention to help assist in weight loss.  No history of medullary thyroid cancer.  No history of pancreatitis in patient. Discussed starting Zepbound  2.5 mg once weekly injection. Potential side effects, pancreatitis risk, discussed. Addressed insurance and cost. - Initiate prior authorization for Zepbound  2.5 mg once weekly subcutaneous injection.  - Schedule follow-up in 2-3 months to assess medication tolerance and effectiveness. If unable to start Zepbound , recommend follow-up in 6 months. Will also check vitamin D  level today.  Orders: -     Lipid panel; Future -     VITAMIN D  25 Hydroxy (Vit-D Deficiency, Fractures); Future -     Tirzepatide -Weight Management; Inject 2.5 mg into the skin once a week.  Dispense: 6 mL; Refill: 0  Chronic bronchitis, unspecified chronic bronchitis type (HCC) Assessment & Plan: Symptoms stable on montelukast  10 mg nightly, Symbicort  twice  daily, albuterol  inhaler as needed for rescue.  Refill sent.  Orders: -     Montelukast  Sodium; TAKE 1 TABLET BY MOUTH EVERYDAY AT BEDTIME  Dispense: 90 tablet; Refill: 3 -     Albuterol  Sulfate HFA; Inhale 1-2 puffs into the lungs every 6 (six) hours as needed for wheezing or shortness of breath.  Dispense: 18 g; Refill: 12 -     Budesonide -Formoterol  Fumarate; Inhale 2 puffs into the lungs 2 (two) times daily. Rinse mouth out  Dispense: 1 each; Refill: 12  B12 deficiency Assessment & Plan: Has been taking over-the-counter B12 supplement 1000 mcg daily, continue.  Repeat B12 level.  Orders: -     Vitamin B12; Future  Vitamin D  deficiency Assessment & Plan: Check vitamin D  level.   Snoring Assessment & Plan: Loud snoring, currently on antihypertensive medication, obesity.  High suspicion for OSA given patient's symptoms.  Recommend evaluation by sleep specialist, referral made.  Orders: -     Pulmonary Visit  Menopausal symptom Assessment & Plan: Experiencing hot flashes, night sweats, and skin changes.  Briefly discussed risk and benefit of hormonal therapy, OB referral.   Hormonal therapy not recommended due to family history of breast cancer and cardiovascular risks and patient. Non-hormonal options discussed.  Consider venlafaxine to help with vasomotor symptoms. Monitor symptoms and consider non-hormonal medication if symptoms worsen.    I personally spent a total of 50 minutes in the care of the patient today including preparing to see the patient, performing a medically appropriate exam/evaluation, counseling and educating, placing orders, referring and communicating with other health care professionals, documenting clinical information in the EHR, independently interpreting results, and communicating results.  Return for Fasting lab when convinient for the patient. Follow up with me in 2-3 months. .   Hazen Brumett, MD

## 2024-06-09 NOTE — Assessment & Plan Note (Signed)
 Currently on losartan .  Discussed discontinuing losartan  and starting telmisartan  for longer half-life of telmisartan .  Patient agreeable.  To start telmisartan  40 mg daily.  Check blood pressure twice a week at home.

## 2024-06-09 NOTE — Assessment & Plan Note (Signed)
 Has been taking over-the-counter B12 supplement 1000 mcg daily, continue.  Repeat B12 level.

## 2024-06-09 NOTE — Telephone Encounter (Signed)
 PA needed for Zepbound

## 2024-06-10 ENCOUNTER — Other Ambulatory Visit (HOSPITAL_COMMUNITY): Payer: Self-pay

## 2024-06-11 ENCOUNTER — Other Ambulatory Visit (HOSPITAL_COMMUNITY): Payer: Self-pay

## 2024-06-15 ENCOUNTER — Other Ambulatory Visit (HOSPITAL_COMMUNITY): Payer: Self-pay

## 2024-06-15 NOTE — Telephone Encounter (Signed)
 Initiated PA via Latent. Per PA, no additional PA required.    Ran test claim for ZEPBOUND  2.5MG . Currently a quantity of 2 ML is a 28 day supply and the co-pay is $ 1,035.19. NO PA REQUIRED.

## 2024-06-16 ENCOUNTER — Ambulatory Visit (INDEPENDENT_AMBULATORY_CARE_PROVIDER_SITE_OTHER)

## 2024-06-16 ENCOUNTER — Ambulatory Visit: Payer: Self-pay

## 2024-06-16 DIAGNOSIS — R7303 Prediabetes: Secondary | ICD-10-CM | POA: Diagnosis not present

## 2024-06-16 DIAGNOSIS — E1169 Type 2 diabetes mellitus with other specified complication: Secondary | ICD-10-CM | POA: Insufficient documentation

## 2024-06-16 DIAGNOSIS — Z6841 Body Mass Index (BMI) 40.0 and over, adult: Secondary | ICD-10-CM | POA: Diagnosis not present

## 2024-06-16 DIAGNOSIS — I10 Essential (primary) hypertension: Secondary | ICD-10-CM | POA: Diagnosis not present

## 2024-06-16 DIAGNOSIS — E119 Type 2 diabetes mellitus without complications: Secondary | ICD-10-CM | POA: Insufficient documentation

## 2024-06-16 DIAGNOSIS — Z23 Encounter for immunization: Secondary | ICD-10-CM | POA: Diagnosis not present

## 2024-06-16 DIAGNOSIS — E559 Vitamin D deficiency, unspecified: Secondary | ICD-10-CM

## 2024-06-16 DIAGNOSIS — E538 Deficiency of other specified B group vitamins: Secondary | ICD-10-CM | POA: Diagnosis not present

## 2024-06-16 LAB — LIPID PANEL
Cholesterol: 176 mg/dL (ref 0–200)
HDL: 64.9 mg/dL (ref >39.00)
LDL Cholesterol: 97 mg/dL (ref 0–99)
NonHDL: 111.4
Total CHOL/HDL Ratio: 3
Triglycerides: 70 mg/dL (ref 0.0–149.0)
VLDL: 14 mg/dL (ref 0.0–40.0)

## 2024-06-16 LAB — COMPREHENSIVE METABOLIC PANEL WITH GFR
ALT: 17 U/L (ref 0–35)
AST: 16 U/L (ref 0–37)
Albumin: 4.2 g/dL (ref 3.5–5.2)
Alkaline Phosphatase: 62 U/L (ref 39–117)
BUN: 10 mg/dL (ref 6–23)
CO2: 28 meq/L (ref 19–32)
Calcium: 9.2 mg/dL (ref 8.4–10.5)
Chloride: 103 meq/L (ref 96–112)
Creatinine, Ser: 0.85 mg/dL (ref 0.40–1.20)
GFR: 79.64 mL/min (ref >60.00)
Glucose, Bld: 115 mg/dL — ABNORMAL HIGH (ref 70–99)
Potassium: 3.9 meq/L (ref 3.5–5.1)
Sodium: 138 meq/L (ref 135–145)
Total Bilirubin: 1 mg/dL (ref 0.2–1.2)
Total Protein: 6.7 g/dL (ref 6.0–8.3)

## 2024-06-16 LAB — HEMOGLOBIN A1C: Hgb A1c MFr Bld: 6.7 % — ABNORMAL HIGH (ref 4.6–6.5)

## 2024-06-16 LAB — TSH: TSH: 2.6 u[IU]/mL (ref 0.35–5.50)

## 2024-06-16 LAB — VITAMIN B12: Vitamin B-12: 279 pg/mL (ref 211–911)

## 2024-06-16 LAB — VITAMIN D 25 HYDROXY (VIT D DEFICIENCY, FRACTURES): VITD: 18.78 ng/mL — ABNORMAL LOW (ref 30.00–100.00)

## 2024-06-16 MED ORDER — VITAMIN D (ERGOCALCIFEROL) 1.25 MG (50000 UNIT) PO CAPS: 50000.0000 [IU] | ORAL_CAPSULE | ORAL | 3 refills | Status: AC

## 2024-06-16 NOTE — Progress Notes (Signed)
 The 10-year ASCVD risk score (Arnett DK, et al., 2019) is: 2.6%   Values used to calculate the score:     Age: 53 years     Clincally relevant sex: Female     Is Non-Hispanic African American: Yes     Diabetic: No     Tobacco smoker: No     Systolic Blood Pressure: 130 mmHg     Is BP treated: Yes     HDL Cholesterol: 64.9 mg/dL     Total Cholesterol: 176 mg/dL

## 2024-06-16 NOTE — Progress Notes (Signed)
 Patient was administered a Flu vaccine into her right deltoid. Patient tolerated the Flu vaccine well.

## 2024-06-17 ENCOUNTER — Telehealth: Payer: Self-pay

## 2024-06-17 MED ORDER — ROSUVASTATIN CALCIUM 5 MG PO TABS: 5.0000 mg | ORAL_TABLET | Freq: Every day | ORAL | 3 refills | Status: AC

## 2024-06-17 NOTE — Progress Notes (Signed)
 Care Guide Pharmacy Note  06/17/2024 Name: SIDDA HUMM MRN: [id number] DOB: 1970-10-29  Referred By: Abbey Bruckner, MD Reason for referral: Complex Care Management and Call Attempt #1 (Successful initial outreach scheduled with PHARM D- Manuelita )   Corey CHRISTELLA Veno is a 53 y.o. year old female who is a primary care patient of Bair, Kalpana, MD.  MARYLYNNE KEELIN was referred to the pharmacist for assistance related to: DMII  Successful contact was made with the patient to discuss pharmacy services including being ready for the pharmacist to call at least 5 minutes before the scheduled appointment time and to have medication bottles and any blood pressure readings ready for review. The patient agreed to meet with the pharmacist via telephone visit on (date/time). 06/22/24 @ 9 AM  Leotis Cloria Davene Salome Romell Ralph Valrie, [facility] Guide  Direct Dial: [phone]  Fax [phone]

## 2024-06-22 ENCOUNTER — Other Ambulatory Visit (INDEPENDENT_AMBULATORY_CARE_PROVIDER_SITE_OTHER): Admitting: Pharmacist

## 2024-06-22 ENCOUNTER — Encounter: Payer: Self-pay | Admitting: Pharmacist

## 2024-06-22 ENCOUNTER — Other Ambulatory Visit (HOSPITAL_COMMUNITY): Payer: Self-pay

## 2024-06-22 DIAGNOSIS — E785 Hyperlipidemia, unspecified: Secondary | ICD-10-CM

## 2024-06-22 DIAGNOSIS — E1169 Type 2 diabetes mellitus with other specified complication: Secondary | ICD-10-CM

## 2024-06-22 MED ORDER — OZEMPIC (0.25 OR 0.5 MG/DOSE) 2 MG/3ML ~~LOC~~ SOPN: PEN_INJECTOR | SUBCUTANEOUS | 0 refills | Status: DC

## 2024-06-22 NOTE — Addendum Note (Signed)
 Addended by: GERONIMO SHAVER R on: 06/22/2024 10:50 AM   Modules accepted: Orders

## 2024-06-22 NOTE — Progress Notes (Signed)
 Prior Authorization:   Medication: Ozempic   Prior Authorization Submitted: 06/22/24 CMM Code: [id number]  Status: APPROVED   Authorization Expiration Date: June 23, 2027  Test Claim [facility])  $24.99 for 30 day supply $49.99 for 90 day supply

## 2024-06-22 NOTE — Progress Notes (Signed)
 06/22/2024 Name: Heather Case MRN: [id number] DOB: January 22, 1971  Subjective  Chief Complaint  Patient presents with   Diabetes    Reason for visit: ?  Heather Case is a 53 y.o. female with a history of diabetes (type 2), who presents today for an initial diabetes pharmacotherapy visit.? Pertinent PMH also includes HLD,  HTN, CHF (postpartum cardiomyopathy), obesity.  Known DM Complications: no known complications   Care Team: Primary Care Provider: Bair, Kalpana, MD Cardiology: [facility]   Date of Last Diabetes Related Visit: with PCP on 06/09/24  Recent Summary of Change: 8/27: losartan  > telmisartan . A1c increased, would like to start Ozempic .   Medication Access/Adherence: Prescription drug coverage: Payor: [employer] / Plan: [facility] / Product Type: *No Product type* / .  - Reports that all medications are affordable. Symbicort  ~$5. - Current Patient Assistance: None (commercial insurance) - Medication Adherence: Patient denies missing doses of their medication.    Since Last visit / History of Present Illness: ?   Reported DM Regimen: ?  N/A   DM medications tried in the past:?  N/A  SMBG: glucometer Has glucometer and testing supplies at home should she need them. Not regularly checking BG at home.    Hypo/Hyperglycemia: ?  Symptoms of hypoglycemia since last visit:? no  Symptoms of hyperglycemia since last visit:? no  Reported Diet: Patient typically eats 2 meals per day.  Breakfast: skips Lunch: salad or sub Dinner: Meat, vegetable, rice (home-cooked)  Snacks: chips, popcorn. Limits sweets/desserts  Beverages: coffee, water. No sugary beverages.   Exercise: Sedentary job. Has starting walking an hour each day for exercise.   DM Prevention:  Statin: Taking; moderate intensity.?  History of chronic kidney disease? no History of albuminuria? no, last UACR: No record ACE/ARB - Taking telmisartan  40mg ; Urine MA/CR Ratio - No record.  Last eye exam: No  record Last foot exam: No foot exam found Tobacco Use: Former  Immunizations:? Flu: Up to date (Last: 06/16/2024); Pneumococcal: No Record - DUE; Shingrix: No Record - DUE  Cardiovascular Risk Reduction History of clinical ASCVD? no The 10-year ASCVD risk score (Arnett DK, et al., 2019) is: 6.1% History of heart failure? yes History of hyperlipidemia? yes Current BMI: 49.9 kg/m2 (Ht 66 in, Wt 142.5 kg) Taking statin? yes; moderate intensity (rosuva 5mg ) Taking aspirin? not indicated; Not taking   Taking SGLT-2i? no Taking GLP- 1 RA? no      _______________________________________________  Objective    Review of Systems:? Limited in the setting of virtual visit  GI:? No nausea, vomiting, constipation, diarrhea, abdominal pain, dyspepsia, change in bowel habits  Endocrine:? No polyuria, polyphagia or blurred vision    Physical Examination:  Vitals:  Wt Readings from Last 3 Encounters:  06/09/24 (!) 314 lb 3.2 oz (142.5 kg)  12/10/23 (!) 308 lb 9.6 oz (140 kg)  12/03/22 286 lb 9.6 oz (130 kg)   BP Readings from Last 3 Encounters:  06/09/24 130/76  12/10/23 (!) 150/78  12/03/22 130/80   Pulse Readings from Last 3 Encounters:  06/09/24 73  12/10/23 85  12/03/22 86     Labs:?  Lab Results  Component Value Date   HGBA1C 6.7 (H) 06/16/2024   HGBA1C 6.4 09/09/2023   HGBA1C 6.2 05/15/2022   GLUCOSE 115 (H) 06/16/2024   CREATININE 0.85 06/16/2024   CREATININE 0.89 09/09/2023   CREATININE 0.91 05/15/2022   GFR 79.64 06/16/2024   GFR 75.77 09/09/2023   GFR 74.46 05/15/2022    Lab  Results  Component Value Date   CHOL 176 06/16/2024   LDLCALC 97 06/16/2024   LDLCALC 109 (H) 09/09/2023   LDLCALC 110 (H) 05/15/2022   HDL 64.90 06/16/2024   TRIG 70.0 06/16/2024   TRIG 66.0 09/09/2023   TRIG 62.0 05/15/2022   ALT 17 06/16/2024   ALT 18 09/09/2023   AST 16 06/16/2024   AST 17 09/09/2023     Chemistry      Component Value Date/Time   NA 138 06/16/2024 0827   K  3.9 06/16/2024 0827   CL 103 06/16/2024 0827   CO2 28 06/16/2024 0827   BUN 10 06/16/2024 0827   CREATININE 0.85 06/16/2024 0827   CREATININE 0.97 01/13/2017 0946      Component Value Date/Time   CALCIUM  9.2 06/16/2024 0827   ALKPHOS 62 06/16/2024 0827   AST 16 06/16/2024 0827   ALT 17 06/16/2024 0827   BILITOT 1.0 06/16/2024 0827     The 10-year ASCVD risk score (Arnett DK, et al., 2019) is: 6.1%  Assessment and Plan:   1. Diabetes, type 2: uncontrolled per last A1c of 6.7% (06/16/24), increased from previous, 6.4%. Goal <7% without hypoglycemia. Doing well with lifestyle goals. Is getting ~1 hour of exercise daily and opts for meals cooked in rather than going out. Discussed GLP1 medications including their added benefits for some patients on cardiorenal risk reduction. Diet: Continue efforts of portion carb portion control. Opt for whole grain carb options.  Exercise: Goal = maintenance of current activity. 1 hour daily walks. Dicussed consideration of incorporating strength/resistance training even 1-2 days per week.  HCM: Due for eye exam Start Ozempic  0.25 mg weekly x4 weeks, then increase to 0.5 mg weekly.  Ozempic  requires Prior Authorization Future Consideration: GLP1-RA: Prior Auth Key: [id number] SGLT2i: Reasonable consideration in the setting of cardiomyopathy. $30 for 90 day supply on test claim. Preference at this time for agent that may assist in weight loss given significant cardiometabolic risk w current BMI ~50 kg/m2. Metformin: Reasonable agent, though preference for medication with Cardiometabolic risk reduction given significant comorbidity including HTN, cardiomyopathy, HLD, BMI ~50. TZD: Avoiding in the setting of CHF    2. ASCVD (primary prevention): uncontrolled on last lipid panel with LDL 97 mg/dL, TG 70 mg/dL (0/6/74). LDL goal <70 mg/dL (primary prevention, diabetes). Ongoing diet and exercise changes may continue to have positive effect on LDL.  Key risk  factors include: diabetes, hypertension, hyperlipidemia, former smoker, and BMI >30 kg/m2 Current Regimen: rosuvastatin  5 mg daily Continue medications today without changes.  Future Consideration: Consider increasing rosuvastatin  if LDL remains elevated on repeat despite diet/exercise/weight loss    4. Healthcare Maintenance:  Pneumococcal - Current status: DUE  Shingles - Current status: DUE Influenza - Current status: up to date, received 2025.   Due to receive the following vaccines: Shingrix, Pneumococcal, and Covid Booster   Follow Up Follow up with clinical pharmacist 6 weeks to assess Ozempic  tolerability/titration  Patient given direct line for questions regarding medication therapy  Future Appointments  Date Time Provider Department Center  08/31/2024  8:00 AM Bair, Luke, MD [facility] [id number] FABIENE Geronimo, PharmD Clinical Pharmacist [facility] [phone]

## 2024-07-20 ENCOUNTER — Encounter: Payer: Self-pay | Admitting: Pharmacist

## 2024-08-12 ENCOUNTER — Other Ambulatory Visit: Payer: Self-pay

## 2024-08-31 ENCOUNTER — Ambulatory Visit

## 2024-09-07 ENCOUNTER — Other Ambulatory Visit: Payer: Self-pay

## 2024-09-07 NOTE — Telephone Encounter (Signed)
 Last office visit 06/09/2024 for establish care.  Last refilled 08/13/2024 for 3 ml with no refills.  Please advise if okay to refill or dose patient need to titrate up to next dose.  Next Appt: 09/08/2024

## 2024-09-07 NOTE — Telephone Encounter (Signed)
 Patient has an appointment with me on 09/08/24. We will discuss dose adjustment on Ozempic  during her visit. Refill will be sent during her visit.  Luke Shade, MD

## 2024-09-08 ENCOUNTER — Ambulatory Visit (INDEPENDENT_AMBULATORY_CARE_PROVIDER_SITE_OTHER)

## 2024-09-08 VITALS — BP 140/80 | HR 80 | Temp 98.8°F | Ht 66.0 in | Wt 301.2 lb

## 2024-09-08 DIAGNOSIS — E1169 Type 2 diabetes mellitus with other specified complication: Secondary | ICD-10-CM

## 2024-09-08 DIAGNOSIS — L308 Other specified dermatitis: Secondary | ICD-10-CM

## 2024-09-08 DIAGNOSIS — Z6841 Body Mass Index (BMI) 40.0 and over, adult: Secondary | ICD-10-CM

## 2024-09-08 DIAGNOSIS — E559 Vitamin D deficiency, unspecified: Secondary | ICD-10-CM

## 2024-09-08 DIAGNOSIS — I10 Essential (primary) hypertension: Secondary | ICD-10-CM | POA: Diagnosis not present

## 2024-09-08 DIAGNOSIS — E785 Hyperlipidemia, unspecified: Secondary | ICD-10-CM

## 2024-09-08 DIAGNOSIS — L309 Dermatitis, unspecified: Secondary | ICD-10-CM | POA: Insufficient documentation

## 2024-09-08 MED ORDER — SEMAGLUTIDE (1 MG/DOSE) 4 MG/3ML ~~LOC~~ SOPN: 1.0000 mg | PEN_INJECTOR | SUBCUTANEOUS | 1 refills | Status: AC

## 2024-09-08 MED ORDER — LOSARTAN POTASSIUM-HCTZ 50-12.5 MG PO TABS: 1.0000 | ORAL_TABLET | Freq: Every day | ORAL | 3 refills | Status: AC

## 2024-09-08 MED ORDER — TRIAMCINOLONE ACETONIDE 0.1 % EX CREA
1.0000 | TOPICAL_CREAM | Freq: Two times a day (BID) | CUTANEOUS | 3 refills | Status: AC | PRN
Start: 2024-09-08 — End: ?

## 2024-09-08 NOTE — Assessment & Plan Note (Addendum)
 Tolerating Ozempic  0.5 mg weekly well.  Discussed increasing dose to improve glycemic control and potential side effects. Increased Ozempic  to 1 mg weekly. Ordered fasting labs after December 3rd. Scheduled annual diabetic eye exam. Orders:   Semaglutide , 1 MG/DOSE, 4 MG/3ML SOPN; Inject 1 mg as directed once a week.   HgB A1c; Future   Urine Microalbumin w/creat. ratio; Future

## 2024-09-08 NOTE — Assessment & Plan Note (Addendum)
 Discontinued telmisartan  due to headaches.  Discontinue Lasix  prn.  Start Losartan -hydrochlorothiazide 50-12.5 mg daily.  Check BP at home.  Orders:   losartan -hydrochlorothiazide (HYZAAR) 50-12.5 MG tablet; Take 1 tablet by mouth daily.

## 2024-09-08 NOTE — Assessment & Plan Note (Addendum)
 Uses prn kenalog  BID when flare up, sees dermatologist for this. Refill sent.  Orders:   triamcinolone  cream (KENALOG ) 0.1 %; Apply 1 Application topically 2 (two) times daily as needed.

## 2024-09-08 NOTE — Assessment & Plan Note (Addendum)
 Continue vitamin D  50000 units weekly, check vitamin D  with next lab.  Orders:   Vitamin D  (25 hydroxy); Future

## 2024-09-08 NOTE — Progress Notes (Signed)
 Established Patient Office Visit   Subjective  Patient ID: Heather Case, female    DOB: [birth year]  Age: 53 y.o. MRN: [id number]  Chief Complaint  Patient presents with   Weight Check   Diabetes   Hypertension    Discussed the use of AI scribe software for clinical note transcription with the patient, who gave verbal consent to proceed.  History of Present Illness  Heather Case is a 53 year old female with type 2 diabetes and hypertension who presents for medication management and follow-up.  She is on 0.5 mg of Ozempic  weekly for diabetes management and has lost 13 pounds, reducing her weight from 314 pounds to 301 pounds. A1c: 6.7% on 06/16/24 . She experiences mild gastrointestinal side effects such as gas and occasional bloating, which are more pronounced on the first day of injection but improve thereafter.   She has a history of hypertension and was recently switched from losartan  to telmisartan , which resulted in headaches. She has been taking Losartan  100 mg that was prescribed to her previously instead. She also takes prn Lasix  for lower leg edema. She has not been checking BP at home. Describes headache as dull aching and band like.   She takes vitamin D  50,000 units weekly for low vitamin D .   She was started on Crestor  5 mg daily, tolerating it well.    She experiences dry eyes regularly but has no recent changes in vision or additional stress at home or work. She uses a cream for eczema as needed, particularly for her ears and neck.  She engages in walking as part of her physical activity and has reduced her intake of snacks, indicating a change in her dietary habits.    ROS As per HPI    Objective:     BP (!) 140/80 (BP Location: Right Arm, Patient Position: Sitting, Cuff Size: Normal)   Pulse 80   Temp 98.8 F (37.1 C) (Oral)   Ht 5' 6 (1.676 m)   Wt (!) 301 lb 3.2 oz (136.6 kg)   LMP 07/02/2021   SpO2 95%   BMI 48.61 kg/m      09/08/2024     3:41 PM 06/09/2024    3:09 PM 12/03/2022    1:17 PM  Depression screen PHQ 2/9  Decreased Interest 0 0 0  Down, Depressed, Hopeless 0 0 0  PHQ - 2 Score 0 0 0  Altered sleeping 0 1   Tired, decreased energy 0 0   Change in appetite 0 0   Feeling bad or failure about yourself  0 0   Trouble concentrating 0 0   Moving slowly or fidgety/restless 0 0   Suicidal thoughts 0 0   PHQ-9 Score 0 1    Difficult doing work/chores Not difficult at all Not difficult at all      Data saved with a previous flowsheet row definition      09/08/2024    3:41 PM 06/09/2024    3:09 PM 12/03/2022    1:17 PM  GAD 7 : Generalized Anxiety Score  Nervous, Anxious, on Edge 0 0 0  Control/stop worrying 0 0 0  Worry too much - different things 0 0 0  Trouble relaxing 0 0 0  Restless 0 0 0  Easily annoyed or irritable 0 0 0  Afraid - awful might happen 0 0 0  Total GAD 7 Score 0 0 0  Anxiety Difficulty Not difficult at all Not difficult at all  Not difficult at all      09/08/2024    3:41 PM 06/09/2024    3:09 PM 12/03/2022    1:17 PM  Depression screen PHQ 2/9  Decreased Interest 0 0 0  Down, Depressed, Hopeless 0 0 0  PHQ - 2 Score 0 0 0  Altered sleeping 0 1   Tired, decreased energy 0 0   Change in appetite 0 0   Feeling bad or failure about yourself  0 0   Trouble concentrating 0 0   Moving slowly or fidgety/restless 0 0   Suicidal thoughts 0 0   PHQ-9 Score 0 1    Difficult doing work/chores Not difficult at all Not difficult at all      Data saved with a previous flowsheet row definition      09/08/2024    3:41 PM 06/09/2024    3:09 PM 12/03/2022    1:17 PM  GAD 7 : Generalized Anxiety Score  Nervous, Anxious, on Edge 0 0 0  Control/stop worrying 0 0 0  Worry too much - different things 0 0 0  Trouble relaxing 0 0 0  Restless 0 0 0  Easily annoyed or irritable 0 0 0  Afraid - awful might happen 0 0 0  Total GAD 7 Score 0 0 0  Anxiety Difficulty Not difficult at all Not difficult  at all Not difficult at all   SDOH Screenings   Depression (PHQ2-9): Low Risk  (09/08/2024)  Tobacco Use: Medium Risk (09/08/2024)     Physical Exam Constitutional:      Appearance: Normal appearance.  HENT:     Head: Normocephalic and atraumatic.     Mouth/Throat:     Mouth: Mucous membranes are moist.  Neck:     Thyroid : No thyroid  mass or thyroid  tenderness.  Cardiovascular:     Rate and Rhythm: Normal rate and regular rhythm.  Pulmonary:     Effort: Pulmonary effort is normal.     Breath sounds: Normal breath sounds. No wheezing.  Abdominal:     General: Bowel sounds are normal.     Palpations: Abdomen is soft.     Tenderness: There is no guarding.  Musculoskeletal:     Cervical back: Neck supple. No rigidity.     Right lower leg: No edema.     Left lower leg: No edema.  Skin:    General: Skin is warm.  Neurological:     Mental Status: She is alert and oriented to person, place, and time.  Psychiatric:        Mood and Affect: Mood normal.        Behavior: Behavior normal.        No results found for any visits on 09/08/24.  The 10-year ASCVD risk score (Arnett DK, et al., 2019) is: 8.8%     Assessment & Plan:   Assessment & Plan Type 2 diabetes mellitus with other specified complication, without long-term current use of insulin (HCC) Tolerating Ozempic  0.5 mg weekly well.  Discussed increasing dose to improve glycemic control and potential side effects. Increased Ozempic  to 1 mg weekly. Ordered fasting labs after December 3rd. Scheduled annual diabetic eye exam. Orders:   Semaglutide , 1 MG/DOSE, 4 MG/3ML SOPN; Inject 1 mg as directed once a week.   HgB A1c; Future   Urine Microalbumin w/creat. ratio; Future  Other eczema Uses prn kenalog  BID when flare up, sees dermatologist for this. Refill sent.  Orders:   triamcinolone  cream (KENALOG ) 0.1 %;  Apply 1 Application topically 2 (two) times daily as needed.  Dyslipidemia associated with type 2  diabetes mellitus (HCC) Tolerating Crestor  5 mg daily, continue. Check CMP and fasting lipid panel. Orders:   Lipid panel; Future   Comp Met (CMET); Future  Vitamin D  deficiency Continue vitamin D  50000 units weekly, check vitamin D  with next lab.  Orders:   Vitamin D  (25 hydroxy); Future  Primary hypertension Discontinued telmisartan  due to headaches.  Discontinue Lasix  prn.  Start Losartan -hydrochlorothiazide 50-12.5 mg daily.  Check BP at home.  Orders:   losartan -hydrochlorothiazide (HYZAAR) 50-12.5 MG tablet; Take 1 tablet by mouth daily.  Morbid obesity with BMI of 45.0-49.9, adult (HCC) Encourage continue lifestyle modifications including regular exercise, healthy diet. Congratulated her on 13 pounds weight loss since her last visit.       Return for fasting labs after 09/19/24, appointment with Dr. Abbey in 4 months .   Luke Abbey, MD

## 2024-09-08 NOTE — Assessment & Plan Note (Signed)
 Encourage continue lifestyle modifications including regular exercise, healthy diet. Congratulated her on 13 pounds weight loss since her last visit.

## 2024-09-08 NOTE — Assessment & Plan Note (Addendum)
 Tolerating Crestor  5 mg daily, continue. Check CMP and fasting lipid panel. Orders:   Lipid panel; Future   Comp Met (CMET); Future

## 2024-09-28 ENCOUNTER — Other Ambulatory Visit

## 2024-09-28 DIAGNOSIS — E559 Vitamin D deficiency, unspecified: Secondary | ICD-10-CM

## 2024-09-28 DIAGNOSIS — E785 Hyperlipidemia, unspecified: Secondary | ICD-10-CM | POA: Diagnosis not present

## 2024-09-28 DIAGNOSIS — E1169 Type 2 diabetes mellitus with other specified complication: Secondary | ICD-10-CM | POA: Diagnosis not present

## 2024-09-28 LAB — LIPID PANEL
Cholesterol: 132 mg/dL (ref 28–200)
HDL: 57.5 mg/dL (ref >39.00)
LDL Cholesterol: 65 mg/dL (ref 10–99)
NonHDL: 74.78
Total CHOL/HDL Ratio: 2
Triglycerides: 50 mg/dL (ref 10.0–149.0)
VLDL: 10 mg/dL (ref 0.0–40.0)

## 2024-09-28 LAB — MICROALBUMIN / CREATININE URINE RATIO
Creatinine,U: 370.5 mg/dL
Microalb Creat Ratio: 3.8 mg/g (ref 0.0–30.0)
Microalb, Ur: 1.4 mg/dL (ref 0.7–1.9)

## 2024-09-28 LAB — COMPREHENSIVE METABOLIC PANEL WITH GFR
ALT: 15 U/L (ref 3–35)
AST: 19 U/L (ref 5–37)
Albumin: 4.4 g/dL (ref 3.5–5.2)
Alkaline Phosphatase: 63 U/L (ref 39–117)
BUN: 8 mg/dL (ref 6–23)
CO2: 29 meq/L (ref 19–32)
Calcium: 9.4 mg/dL (ref 8.4–10.5)
Chloride: 102 meq/L (ref 96–112)
Creatinine, Ser: 0.86 mg/dL (ref 0.40–1.20)
GFR: 78.37 mL/min (ref >60.00)
Glucose, Bld: 95 mg/dL (ref 70–99)
Potassium: 3.9 meq/L (ref 3.5–5.1)
Sodium: 138 meq/L (ref 135–145)
Total Bilirubin: 1.3 mg/dL — ABNORMAL HIGH (ref 0.2–1.2)
Total Protein: 6.6 g/dL (ref 6.0–8.3)

## 2024-09-28 LAB — VITAMIN D 25 HYDROXY (VIT D DEFICIENCY, FRACTURES): VITD: 20.1 ng/mL — ABNORMAL LOW (ref 30.00–100.00)

## 2024-09-28 LAB — HEMOGLOBIN A1C: Hgb A1c MFr Bld: 6 % (ref 4.6–6.5)

## 2024-09-29 ENCOUNTER — Ambulatory Visit: Payer: Self-pay

## 2025-01-06 ENCOUNTER — Ambulatory Visit
